# Patient Record
Sex: Male | Born: 1960 | Race: White | Hispanic: No | Marital: Married | Smoking: Never smoker
Health system: Southern US, Community
[De-identification: ages and names within clinical notes are randomized; demographics above are authoritative.]

## PROBLEM LIST (undated history)

## (undated) DIAGNOSIS — C719 Malignant neoplasm of brain, unspecified: Secondary | ICD-10-CM

## (undated) DIAGNOSIS — R519 Headache, unspecified: Secondary | ICD-10-CM

## (undated) DIAGNOSIS — R569 Unspecified convulsions: Secondary | ICD-10-CM

## (undated) DIAGNOSIS — I10 Essential (primary) hypertension: Secondary | ICD-10-CM

## (undated) DIAGNOSIS — E079 Disorder of thyroid, unspecified: Secondary | ICD-10-CM

## (undated) DIAGNOSIS — J309 Allergic rhinitis, unspecified: Secondary | ICD-10-CM

## (undated) DIAGNOSIS — M1990 Unspecified osteoarthritis, unspecified site: Secondary | ICD-10-CM

## (undated) DIAGNOSIS — E039 Hypothyroidism, unspecified: Secondary | ICD-10-CM

## (undated) HISTORY — DX: Unspecified convulsions: R56.9

## (undated) HISTORY — DX: Disorder of thyroid, unspecified: E07.9

## (undated) HISTORY — DX: Malignant neoplasm of brain, unspecified: C71.9

## (undated) HISTORY — DX: Essential (primary) hypertension: I10

## (undated) HISTORY — DX: Allergic rhinitis, unspecified: J30.9

---

## 2003-07-15 ENCOUNTER — Emergency Department (HOSPITAL_COMMUNITY): Admission: EM | Admit: 2003-07-15 | Discharge: 2003-07-16 | Payer: Self-pay | Admitting: Emergency Medicine

## 2003-07-19 ENCOUNTER — Observation Stay (HOSPITAL_COMMUNITY): Admission: AD | Admit: 2003-07-19 | Discharge: 2003-07-20 | Payer: Self-pay | Admitting: Nephrology

## 2004-06-25 ENCOUNTER — Ambulatory Visit: Payer: Self-pay | Admitting: Internal Medicine

## 2004-11-08 ENCOUNTER — Ambulatory Visit: Payer: Self-pay | Admitting: Internal Medicine

## 2005-01-07 ENCOUNTER — Ambulatory Visit: Payer: Self-pay | Admitting: Internal Medicine

## 2005-07-25 ENCOUNTER — Ambulatory Visit: Payer: Self-pay | Admitting: Internal Medicine

## 2007-02-19 HISTORY — PX: CRANIOTOMY: SHX93

## 2007-11-20 ENCOUNTER — Emergency Department (HOSPITAL_COMMUNITY): Admission: EM | Admit: 2007-11-20 | Discharge: 2007-11-20 | Payer: Self-pay | Admitting: Emergency Medicine

## 2007-12-01 ENCOUNTER — Encounter: Payer: Self-pay | Admitting: Internal Medicine

## 2007-12-02 ENCOUNTER — Encounter: Payer: Self-pay | Admitting: Internal Medicine

## 2007-12-06 ENCOUNTER — Ambulatory Visit (HOSPITAL_COMMUNITY): Admission: RE | Admit: 2007-12-06 | Discharge: 2007-12-06 | Payer: Self-pay | Admitting: Neurosurgery

## 2007-12-07 ENCOUNTER — Encounter (INDEPENDENT_AMBULATORY_CARE_PROVIDER_SITE_OTHER): Payer: Self-pay | Admitting: Neurosurgery

## 2007-12-07 ENCOUNTER — Inpatient Hospital Stay (HOSPITAL_COMMUNITY): Admission: RE | Admit: 2007-12-07 | Discharge: 2007-12-09 | Payer: Self-pay | Admitting: Neurosurgery

## 2007-12-08 ENCOUNTER — Ambulatory Visit: Payer: Self-pay | Admitting: Oncology

## 2007-12-09 ENCOUNTER — Ambulatory Visit: Payer: Self-pay | Admitting: Oncology

## 2007-12-15 LAB — COMPREHENSIVE METABOLIC PANEL
ALT: 17 U/L (ref 0–53)
AST: 10 U/L (ref 0–37)
CO2: 24 mEq/L (ref 19–32)
Chloride: 102 mEq/L (ref 96–112)
Creatinine, Ser: 0.85 mg/dL (ref 0.40–1.50)
Sodium: 137 mEq/L (ref 135–145)
Total Bilirubin: 0.5 mg/dL (ref 0.3–1.2)
Total Protein: 6.5 g/dL (ref 6.0–8.3)

## 2007-12-15 LAB — CBC WITH DIFFERENTIAL/PLATELET
BASO%: 0 % (ref 0.0–2.0)
EOS%: 0 % (ref 0.0–7.0)
LYMPH%: 7.9 % — ABNORMAL LOW (ref 14.0–48.0)
MCH: 31.2 pg (ref 28.0–33.4)
MCHC: 34 g/dL (ref 32.0–35.9)
MONO#: 0.8 10*3/uL (ref 0.1–0.9)
RBC: 4.29 10*6/uL (ref 4.20–5.71)
WBC: 13.6 10*3/uL — ABNORMAL HIGH (ref 4.0–10.0)
lymph#: 1.1 10*3/uL (ref 0.9–3.3)

## 2007-12-16 ENCOUNTER — Ambulatory Visit: Admission: RE | Admit: 2007-12-16 | Discharge: 2008-02-18 | Payer: Self-pay | Admitting: Radiation Oncology

## 2007-12-17 ENCOUNTER — Encounter: Payer: Self-pay | Admitting: Internal Medicine

## 2007-12-21 ENCOUNTER — Encounter: Payer: Self-pay | Admitting: Internal Medicine

## 2007-12-24 LAB — URINALYSIS, MICROSCOPIC - CHCC
Blood: NEGATIVE
Glucose: NEGATIVE g/dL
Leukocyte Esterase: NEGATIVE
Nitrite: NEGATIVE
Protein: <30 mg/dL
Specific Gravity, Urine: 1.025 (ref 1.003–1.035)
WBC, UA: NEGATIVE (ref 0–2)

## 2007-12-24 LAB — COMPREHENSIVE METABOLIC PANEL
Alkaline Phosphatase: 41 U/L (ref 39–117)
CO2: 22 mEq/L (ref 19–32)
Creatinine, Ser: 0.92 mg/dL (ref 0.40–1.50)
Glucose, Bld: 96 mg/dL (ref 70–99)
Sodium: 136 mEq/L (ref 135–145)
Total Bilirubin: 0.5 mg/dL (ref 0.3–1.2)
Total Protein: 6.5 g/dL (ref 6.0–8.3)

## 2007-12-24 LAB — CBC WITH DIFFERENTIAL/PLATELET
BASO%: 0.2 % (ref 0.0–2.0)
Eosinophils Absolute: 0 10*3/uL (ref 0.0–0.5)
HGB: 13 g/dL (ref 13.0–17.1)
LYMPH%: 11 % — ABNORMAL LOW (ref 14.0–48.0)
MONO#: 1.1 10*3/uL — ABNORMAL HIGH (ref 0.1–0.9)
MONO%: 11.5 % (ref 0.0–13.0)
NEUT#: 7.5 10*3/uL — ABNORMAL HIGH (ref 1.5–6.5)
NEUT%: 77.2 % — ABNORMAL HIGH (ref 40.0–75.0)
RDW: 13 % (ref 11.2–14.6)
WBC: 9.7 10*3/uL (ref 4.0–10.0)
lymph#: 1.1 10*3/uL (ref 0.9–3.3)

## 2008-01-04 ENCOUNTER — Encounter: Payer: Self-pay | Admitting: Internal Medicine

## 2008-01-12 LAB — CBC WITH DIFFERENTIAL/PLATELET
Basophils Absolute: 0 10*3/uL (ref 0.0–0.1)
Eosinophils Absolute: 0 10*3/uL (ref 0.0–0.5)
HCT: 38.5 % — ABNORMAL LOW (ref 38.7–49.9)
LYMPH%: 16.9 % (ref 14.0–48.0)
MCV: 92 fL (ref 81.6–98.0)
MONO%: 7.8 % (ref 0.0–13.0)
NEUT#: 6.1 10*3/uL (ref 1.5–6.5)
NEUT%: 75 % (ref 40.0–75.0)
Platelets: 224 10*3/uL (ref 145–400)
RBC: 4.19 10*6/uL — ABNORMAL LOW (ref 4.20–5.71)

## 2008-01-18 LAB — CBC WITH DIFFERENTIAL/PLATELET
BASO%: 0.3 % (ref 0.0–2.0)
LYMPH%: 31.7 % (ref 14.0–48.0)
MCH: 32.2 pg (ref 28.0–33.4)
MCHC: 34.6 g/dL (ref 32.0–35.9)
MCV: 92.9 fL (ref 81.6–98.0)
MONO%: 10.3 % (ref 0.0–13.0)
NEUT%: 57.2 % (ref 40.0–75.0)
Platelets: 224 10*3/uL (ref 145–400)
RBC: 3.94 10*6/uL — ABNORMAL LOW (ref 4.20–5.71)

## 2008-01-20 ENCOUNTER — Encounter: Payer: Self-pay | Admitting: Internal Medicine

## 2008-01-26 LAB — CBC WITH DIFFERENTIAL/PLATELET
BASO%: 0.3 % (ref 0.0–2.0)
HCT: 36.4 % — ABNORMAL LOW (ref 38.7–49.9)
LYMPH%: 12.4 % — ABNORMAL LOW (ref 14.0–48.0)
MCH: 32.1 pg (ref 28.0–33.4)
MCHC: 35 g/dL (ref 32.0–35.9)
MCV: 91.6 fL (ref 81.6–98.0)
MONO%: 6.2 % (ref 0.0–13.0)
NEUT%: 80.9 % — ABNORMAL HIGH (ref 40.0–75.0)
Platelets: 233 10*3/uL (ref 145–400)
RBC: 3.98 10*6/uL — ABNORMAL LOW (ref 4.20–5.71)

## 2008-02-03 ENCOUNTER — Ambulatory Visit: Payer: Self-pay | Admitting: Oncology

## 2008-02-05 LAB — COMPREHENSIVE METABOLIC PANEL
ALT: 46 U/L (ref 0–53)
AST: 20 U/L (ref 0–37)
Albumin: 4.4 g/dL (ref 3.5–5.2)
BUN: 14 mg/dL (ref 6–23)
CO2: 23 mEq/L (ref 19–32)
Calcium: 9.1 mg/dL (ref 8.4–10.5)
Chloride: 105 mEq/L (ref 96–112)
Potassium: 3.7 mEq/L (ref 3.5–5.3)

## 2008-02-05 LAB — CBC WITH DIFFERENTIAL/PLATELET
BASO%: 0.3 % (ref 0.0–2.0)
EOS%: 1 % (ref 0.0–7.0)
LYMPH%: 17.6 % (ref 14.0–48.0)
MCH: 32.3 pg (ref 28.0–33.4)
MCHC: 35 g/dL (ref 32.0–35.9)
MONO#: 0.9 10*3/uL (ref 0.1–0.9)
NEUT%: 70.2 % (ref 40.0–75.0)
RBC: 4 10*6/uL — ABNORMAL LOW (ref 4.20–5.71)
WBC: 8.4 10*3/uL (ref 4.0–10.0)
lymph#: 1.5 10*3/uL (ref 0.9–3.3)

## 2008-02-17 LAB — CBC WITH DIFFERENTIAL/PLATELET
BASO%: 0.4 % (ref 0.0–2.0)
EOS%: 1.8 % (ref 0.0–7.0)
MCH: 32.5 pg (ref 28.0–33.4)
MCHC: 35.1 g/dL (ref 32.0–35.9)
RDW: 14.2 % (ref 11.2–14.6)
WBC: 6.4 10*3/uL (ref 4.0–10.0)
lymph#: 1.3 10*3/uL (ref 0.9–3.3)

## 2008-02-24 LAB — CBC WITH DIFFERENTIAL/PLATELET
Eosinophils Absolute: 0.2 10*3/uL (ref 0.0–0.5)
HCT: 35.1 % — ABNORMAL LOW (ref 38.7–49.9)
LYMPH%: 22 % (ref 14.0–48.0)
MCHC: 35.3 g/dL (ref 32.0–35.9)
MCV: 92 fL (ref 81.6–98.0)
MONO#: 0.8 10*3/uL (ref 0.1–0.9)
MONO%: 13.4 % — ABNORMAL HIGH (ref 0.0–13.0)
NEUT%: 60.1 % (ref 40.0–75.0)
Platelets: 208 10*3/uL (ref 145–400)
WBC: 6.1 10*3/uL (ref 4.0–10.0)

## 2008-02-29 ENCOUNTER — Encounter: Payer: Self-pay | Admitting: Internal Medicine

## 2008-03-10 ENCOUNTER — Encounter: Payer: Self-pay | Admitting: Internal Medicine

## 2008-03-11 LAB — COMPREHENSIVE METABOLIC PANEL
ALT: 38 U/L (ref 0–53)
AST: 26 U/L (ref 0–37)
Albumin: 4.2 g/dL (ref 3.5–5.2)
BUN: 27 mg/dL — ABNORMAL HIGH (ref 6–23)
Calcium: 8.9 mg/dL (ref 8.4–10.5)
Chloride: 103 mEq/L (ref 96–112)
Potassium: 3.8 mEq/L (ref 3.5–5.3)
Sodium: 140 mEq/L (ref 135–145)
Total Protein: 7.1 g/dL (ref 6.0–8.3)

## 2008-03-11 LAB — CBC WITH DIFFERENTIAL/PLATELET
BASO%: 0.5 % (ref 0.0–2.0)
Basophils Absolute: 0 10*3/uL (ref 0.0–0.1)
EOS%: 2.9 % (ref 0.0–7.0)
HGB: 12.6 g/dL — ABNORMAL LOW (ref 13.0–17.1)
MCH: 32.5 pg (ref 28.0–33.4)
NEUT#: 3.5 10*3/uL (ref 1.5–6.5)
RBC: 3.88 10*6/uL — ABNORMAL LOW (ref 4.20–5.71)
RDW: 13.7 % (ref 11.2–14.6)
lymph#: 1.1 10*3/uL (ref 0.9–3.3)

## 2008-03-14 ENCOUNTER — Encounter: Payer: Self-pay | Admitting: Internal Medicine

## 2008-03-22 ENCOUNTER — Ambulatory Visit: Payer: Self-pay | Admitting: Oncology

## 2008-03-22 LAB — BASIC METABOLIC PANEL
BUN: 23 mg/dL (ref 6–23)
CO2: 20 mEq/L (ref 19–32)
Chloride: 103 mEq/L (ref 96–112)
Creatinine, Ser: 1.27 mg/dL (ref 0.40–1.50)
Glucose, Bld: 122 mg/dL — ABNORMAL HIGH (ref 70–99)
Potassium: 3.3 mEq/L — ABNORMAL LOW (ref 3.5–5.3)

## 2008-04-12 ENCOUNTER — Ambulatory Visit (HOSPITAL_COMMUNITY): Admission: RE | Admit: 2008-04-12 | Discharge: 2008-04-12 | Payer: Self-pay | Admitting: Oncology

## 2008-04-15 LAB — TECHNOLOGIST REVIEW

## 2008-04-15 LAB — CBC WITH DIFFERENTIAL/PLATELET
BASO%: 0.4 % (ref 0.0–2.0)
EOS%: 0.1 % (ref 0.0–7.0)
Eosinophils Absolute: 0 10*3/uL (ref 0.0–0.5)
LYMPH%: 65.2 % — ABNORMAL HIGH (ref 14.0–49.0)
MCH: 33 pg (ref 27.2–33.4)
MCHC: 35.3 g/dL (ref 32.0–36.0)
MCV: 93.4 fL (ref 79.3–98.0)
MONO%: 16.6 % — ABNORMAL HIGH (ref 0.0–14.0)
NEUT#: 0.3 10*3/uL — CL (ref 1.5–6.5)
Platelets: 158 10*3/uL (ref 140–400)
RBC: 3.17 10*6/uL — ABNORMAL LOW (ref 4.20–5.82)
RDW: 14.1 % (ref 11.0–14.6)

## 2008-04-15 LAB — COMPREHENSIVE METABOLIC PANEL
ALT: 36 U/L (ref 0–53)
Alkaline Phosphatase: 57 U/L (ref 39–117)
CO2: 19 mEq/L (ref 19–32)
Creatinine, Ser: 1.18 mg/dL (ref 0.40–1.50)
Sodium: 140 mEq/L (ref 135–145)
Total Bilirubin: 0.3 mg/dL (ref 0.3–1.2)
Total Protein: 7.2 g/dL (ref 6.0–8.3)

## 2008-04-20 LAB — CBC WITH DIFFERENTIAL/PLATELET
Basophils Absolute: 0 10*3/uL (ref 0.0–0.1)
EOS%: 0 % (ref 0.0–7.0)
Eosinophils Absolute: 0 10*3/uL (ref 0.0–0.5)
HGB: 10.1 g/dL — ABNORMAL LOW (ref 13.0–17.1)
LYMPH%: 23.3 % (ref 14.0–49.0)
MCH: 34 pg — ABNORMAL HIGH (ref 27.2–33.4)
MCV: 96.5 fL (ref 79.3–98.0)
MONO%: 15.6 % — ABNORMAL HIGH (ref 0.0–14.0)
Platelets: 297 10*3/uL (ref 140–400)
RBC: 2.96 10*6/uL — ABNORMAL LOW (ref 4.20–5.82)
RDW: 14.7 % — ABNORMAL HIGH (ref 11.0–14.6)

## 2008-04-29 LAB — CBC WITH DIFFERENTIAL/PLATELET
BASO%: 0.2 % (ref 0.0–2.0)
EOS%: 0 % (ref 0.0–7.0)
HCT: 31.9 % — ABNORMAL LOW (ref 38.4–49.9)
LYMPH%: 22.5 % (ref 14.0–49.0)
MCH: 34.2 pg — ABNORMAL HIGH (ref 27.2–33.4)
MCHC: 34.7 g/dL (ref 32.0–36.0)
MONO#: 1 10*3/uL — ABNORMAL HIGH (ref 0.1–0.9)
MONO%: 13.7 % (ref 0.0–14.0)
NEUT%: 63.6 % (ref 39.0–75.0)
Platelets: 186 10*3/uL (ref 140–400)
RBC: 3.23 10*6/uL — ABNORMAL LOW (ref 4.20–5.82)
WBC: 7 10*3/uL (ref 4.0–10.3)

## 2008-05-04 LAB — CBC WITH DIFFERENTIAL/PLATELET
BASO%: 0.3 % (ref 0.0–2.0)
EOS%: 0.1 % (ref 0.0–7.0)
MCH: 34.3 pg — ABNORMAL HIGH (ref 27.2–33.4)
MCHC: 34.1 g/dL (ref 32.0–36.0)
MONO#: 0.7 10*3/uL (ref 0.1–0.9)
NEUT%: 78.8 % — ABNORMAL HIGH (ref 39.0–75.0)
RBC: 2.96 10*6/uL — ABNORMAL LOW (ref 4.20–5.82)
RDW: 20 % — ABNORMAL HIGH (ref 11.0–14.6)
WBC: 8.2 10*3/uL (ref 4.0–10.3)
lymph#: 1 10*3/uL (ref 0.9–3.3)

## 2008-05-10 ENCOUNTER — Ambulatory Visit: Payer: Self-pay | Admitting: Oncology

## 2008-05-10 LAB — CBC WITH DIFFERENTIAL/PLATELET
BASO%: 0.4 % (ref 0.0–2.0)
Basophils Absolute: 0 10*3/uL (ref 0.0–0.1)
EOS%: 0.5 % (ref 0.0–7.0)
HCT: 30.8 % — ABNORMAL LOW (ref 38.4–49.9)
HGB: 10.8 g/dL — ABNORMAL LOW (ref 13.0–17.1)
MCH: 35.6 pg — ABNORMAL HIGH (ref 27.2–33.4)
MONO#: 0.7 10*3/uL (ref 0.1–0.9)
RDW: 21.1 % — ABNORMAL HIGH (ref 11.0–14.6)
WBC: 7.7 10*3/uL (ref 4.0–10.3)
lymph#: 0.9 10*3/uL (ref 0.9–3.3)

## 2008-05-10 LAB — COMPREHENSIVE METABOLIC PANEL
ALT: 47 U/L (ref 0–53)
AST: 26 U/L (ref 0–37)
Albumin: 3.7 g/dL (ref 3.5–5.2)
BUN: 19 mg/dL (ref 6–23)
CO2: 25 mEq/L (ref 19–32)
Calcium: 8.4 mg/dL (ref 8.4–10.5)
Chloride: 111 mEq/L (ref 96–112)
Potassium: 3.4 mEq/L — ABNORMAL LOW (ref 3.5–5.3)

## 2008-05-18 ENCOUNTER — Ambulatory Visit (HOSPITAL_COMMUNITY): Admission: RE | Admit: 2008-05-18 | Discharge: 2008-05-18 | Payer: Self-pay | Admitting: Oncology

## 2008-05-20 LAB — CBC WITH DIFFERENTIAL/PLATELET
EOS%: 2.4 % (ref 0.0–7.0)
LYMPH%: 20.3 % (ref 14.0–49.0)
MCHC: 35 g/dL (ref 32.0–36.0)
MCV: 100.6 fL — ABNORMAL HIGH (ref 79.3–98.0)
MONO%: 10.1 % (ref 0.0–14.0)
NEUT#: 3.3 10*3/uL (ref 1.5–6.5)
Platelets: 87 10*3/uL — ABNORMAL LOW (ref 140–400)
RBC: 3.35 10*6/uL — ABNORMAL LOW (ref 4.20–5.82)
RDW: 19.3 % — ABNORMAL HIGH (ref 11.0–14.6)

## 2008-05-20 LAB — BASIC METABOLIC PANEL
BUN: 12 mg/dL (ref 6–23)
CO2: 24 mEq/L (ref 19–32)
Chloride: 107 mEq/L (ref 96–112)
Creatinine, Ser: 0.99 mg/dL (ref 0.40–1.50)
Glucose, Bld: 87 mg/dL (ref 70–99)

## 2008-05-27 LAB — CBC WITH DIFFERENTIAL/PLATELET
Basophils Absolute: 0 10*3/uL (ref 0.0–0.1)
EOS%: 0.8 % (ref 0.0–7.0)
LYMPH%: 18.8 % (ref 14.0–49.0)
MCH: 35.6 pg — ABNORMAL HIGH (ref 27.2–33.4)
MCV: 101 fL — ABNORMAL HIGH (ref 79.3–98.0)
MONO%: 8.5 % (ref 0.0–14.0)
RBC: 3.39 10*6/uL — ABNORMAL LOW (ref 4.20–5.82)
RDW: 19.5 % — ABNORMAL HIGH (ref 11.0–14.6)

## 2008-06-03 LAB — CBC WITH DIFFERENTIAL/PLATELET
BASO%: 0.4 % (ref 0.0–2.0)
Eosinophils Absolute: 0 10*3/uL (ref 0.0–0.5)
MCHC: 34.9 g/dL (ref 32.0–36.0)
MCV: 99.6 fL — ABNORMAL HIGH (ref 79.3–98.0)
MONO%: 14.7 % — ABNORMAL HIGH (ref 0.0–14.0)
NEUT#: 3.1 10*3/uL (ref 1.5–6.5)
RBC: 3.19 10*6/uL — ABNORMAL LOW (ref 4.20–5.82)
RDW: 18.7 % — ABNORMAL HIGH (ref 11.0–14.6)
WBC: 4.6 10*3/uL (ref 4.0–10.3)

## 2008-06-03 LAB — BASIC METABOLIC PANEL
Glucose, Bld: 102 mg/dL — ABNORMAL HIGH (ref 70–99)
Potassium: 3.6 mEq/L (ref 3.5–5.3)
Sodium: 140 mEq/L (ref 135–145)

## 2008-06-22 ENCOUNTER — Ambulatory Visit: Payer: Self-pay | Admitting: Oncology

## 2008-06-24 LAB — CBC WITH DIFFERENTIAL/PLATELET
BASO%: 0.3 % (ref 0.0–2.0)
Basophils Absolute: 0 10*3/uL (ref 0.0–0.1)
Eosinophils Absolute: 0.1 10*3/uL (ref 0.0–0.5)
HCT: 33 % — ABNORMAL LOW (ref 38.4–49.9)
HGB: 11.5 g/dL — ABNORMAL LOW (ref 13.0–17.1)
MONO#: 0.6 10*3/uL (ref 0.1–0.9)
NEUT%: 57.2 % (ref 39.0–75.0)
lymph#: 1.1 10*3/uL (ref 0.9–3.3)

## 2008-06-24 LAB — COMPREHENSIVE METABOLIC PANEL
ALT: 35 U/L (ref 0–53)
BUN: 13 mg/dL (ref 6–23)
CO2: 21 mEq/L (ref 19–32)
Calcium: 8.6 mg/dL (ref 8.4–10.5)
Chloride: 112 mEq/L (ref 96–112)
Creatinine, Ser: 1.02 mg/dL (ref 0.40–1.50)
Glucose, Bld: 100 mg/dL — ABNORMAL HIGH (ref 70–99)

## 2008-07-11 LAB — CBC WITH DIFFERENTIAL/PLATELET
BASO%: 0.2 % (ref 0.0–2.0)
HCT: 31.4 % — ABNORMAL LOW (ref 38.4–49.9)
MCHC: 35 g/dL (ref 32.0–36.0)
MONO#: 0.6 10*3/uL (ref 0.1–0.9)
NEUT#: 3.4 10*3/uL (ref 1.5–6.5)
NEUT%: 72.1 % (ref 39.0–75.0)
RBC: 3.26 10*6/uL — ABNORMAL LOW (ref 4.20–5.82)
WBC: 4.7 10*3/uL (ref 4.0–10.3)
lymph#: 0.7 10*3/uL — ABNORMAL LOW (ref 0.9–3.3)

## 2008-07-11 LAB — BASIC METABOLIC PANEL
CO2: 24 mEq/L (ref 19–32)
Chloride: 110 mEq/L (ref 96–112)
Sodium: 140 mEq/L (ref 135–145)

## 2008-07-12 ENCOUNTER — Ambulatory Visit (HOSPITAL_COMMUNITY): Admission: RE | Admit: 2008-07-12 | Discharge: 2008-07-12 | Payer: Self-pay | Admitting: Oncology

## 2008-07-22 LAB — BASIC METABOLIC PANEL
BUN: 13 mg/dL (ref 6–23)
CO2: 24 mEq/L (ref 19–32)
Calcium: 8.6 mg/dL (ref 8.4–10.5)
Chloride: 111 mEq/L (ref 96–112)
Creatinine, Ser: 1.12 mg/dL (ref 0.40–1.50)
Glucose, Bld: 95 mg/dL (ref 70–99)
Potassium: 3.3 mEq/L — ABNORMAL LOW (ref 3.5–5.3)
Sodium: 141 mEq/L (ref 135–145)

## 2008-07-22 LAB — CBC WITH DIFFERENTIAL/PLATELET
Eosinophils Absolute: 0.1 10*3/uL (ref 0.0–0.5)
HCT: 33.1 % — ABNORMAL LOW (ref 38.4–49.9)
LYMPH%: 24 % (ref 14.0–49.0)
MONO#: 0.6 10*3/uL (ref 0.1–0.9)
NEUT#: 2.1 10*3/uL (ref 1.5–6.5)
Platelets: 183 10*3/uL (ref 140–400)
RBC: 3.3 10*6/uL — ABNORMAL LOW (ref 4.20–5.82)
WBC: 3.6 10*3/uL — ABNORMAL LOW (ref 4.0–10.3)

## 2008-08-04 LAB — BASIC METABOLIC PANEL
BUN: 14 mg/dL (ref 6–23)
CO2: 21 mEq/L (ref 19–32)
Chloride: 112 mEq/L (ref 96–112)
Creatinine, Ser: 1.08 mg/dL (ref 0.40–1.50)
Glucose, Bld: 93 mg/dL (ref 70–99)

## 2008-08-04 LAB — CBC WITH DIFFERENTIAL/PLATELET
Basophils Absolute: 0 10*3/uL (ref 0.0–0.1)
EOS%: 1.7 % (ref 0.0–7.0)
Eosinophils Absolute: 0.1 10*3/uL (ref 0.0–0.5)
HCT: 34.1 % — ABNORMAL LOW (ref 38.4–49.9)
HGB: 12.2 g/dL — ABNORMAL LOW (ref 13.0–17.1)
MCH: 35.5 pg — ABNORMAL HIGH (ref 27.2–33.4)
MONO#: 0.6 10*3/uL (ref 0.1–0.9)
NEUT#: 2 10*3/uL (ref 1.5–6.5)
NEUT%: 56.3 % (ref 39.0–75.0)
lymph#: 0.9 10*3/uL (ref 0.9–3.3)

## 2008-08-15 ENCOUNTER — Ambulatory Visit (HOSPITAL_COMMUNITY): Admission: RE | Admit: 2008-08-15 | Discharge: 2008-08-15 | Payer: Self-pay | Admitting: Oncology

## 2008-08-15 ENCOUNTER — Ambulatory Visit: Payer: Self-pay | Admitting: Oncology

## 2008-08-30 LAB — COMPREHENSIVE METABOLIC PANEL
ALT: 23 U/L (ref 0–53)
AST: 24 U/L (ref 0–37)
Alkaline Phosphatase: 58 U/L (ref 39–117)
CO2: 23 mEq/L (ref 19–32)
Sodium: 139 mEq/L (ref 135–145)
Total Bilirubin: 0.7 mg/dL (ref 0.3–1.2)
Total Protein: 6.8 g/dL (ref 6.0–8.3)

## 2008-08-30 LAB — CBC WITH DIFFERENTIAL/PLATELET
BASO%: 0.2 % (ref 0.0–2.0)
EOS%: 1 % (ref 0.0–7.0)
LYMPH%: 12.3 % — ABNORMAL LOW (ref 14.0–49.0)
MCH: 34.5 pg — ABNORMAL HIGH (ref 27.2–33.4)
MCHC: 35.9 g/dL (ref 32.0–36.0)
MONO#: 0.4 10*3/uL (ref 0.1–0.9)
Platelets: 128 10*3/uL — ABNORMAL LOW (ref 140–400)
RBC: 3.7 10*6/uL — ABNORMAL LOW (ref 4.20–5.82)
WBC: 4.7 10*3/uL (ref 4.0–10.3)

## 2008-09-13 LAB — CBC WITH DIFFERENTIAL/PLATELET
Basophils Absolute: 0 10*3/uL (ref 0.0–0.1)
Eosinophils Absolute: 0 10*3/uL (ref 0.0–0.5)
HGB: 12.7 g/dL — ABNORMAL LOW (ref 13.0–17.1)
LYMPH%: 8.8 % — ABNORMAL LOW (ref 14.0–49.0)
MCV: 93.8 fL (ref 79.3–98.0)
MONO%: 8.6 % (ref 0.0–14.0)
NEUT#: 4.6 10*3/uL (ref 1.5–6.5)
NEUT%: 82 % — ABNORMAL HIGH (ref 39.0–75.0)
Platelets: 101 10*3/uL — ABNORMAL LOW (ref 140–400)

## 2008-09-13 LAB — COMPREHENSIVE METABOLIC PANEL
Albumin: 4.2 g/dL (ref 3.5–5.2)
Alkaline Phosphatase: 57 U/L (ref 39–117)
BUN: 15 mg/dL (ref 6–23)
Glucose, Bld: 87 mg/dL (ref 70–99)
Total Bilirubin: 0.6 mg/dL (ref 0.3–1.2)

## 2008-09-19 ENCOUNTER — Ambulatory Visit (HOSPITAL_COMMUNITY): Admission: RE | Admit: 2008-09-19 | Discharge: 2008-09-19 | Payer: Self-pay | Admitting: Oncology

## 2008-09-23 ENCOUNTER — Ambulatory Visit: Payer: Self-pay | Admitting: Oncology

## 2008-09-27 LAB — COMPREHENSIVE METABOLIC PANEL
AST: 28 U/L (ref 0–37)
Alkaline Phosphatase: 60 U/L (ref 39–117)
BUN: 15 mg/dL (ref 6–23)
Creatinine, Ser: 1.27 mg/dL (ref 0.40–1.50)
Potassium: 3.1 mEq/L — ABNORMAL LOW (ref 3.5–5.3)
Total Bilirubin: 0.7 mg/dL (ref 0.3–1.2)

## 2008-09-27 LAB — CBC WITH DIFFERENTIAL/PLATELET
Basophils Absolute: 0 10*3/uL (ref 0.0–0.1)
EOS%: 1.8 % (ref 0.0–7.0)
HGB: 12.1 g/dL — ABNORMAL LOW (ref 13.0–17.1)
LYMPH%: 14.8 % (ref 14.0–49.0)
MCH: 33.8 pg — ABNORMAL HIGH (ref 27.2–33.4)
MCV: 95.9 fL (ref 79.3–98.0)
MONO%: 9.5 % (ref 0.0–14.0)
NEUT%: 73.8 % (ref 39.0–75.0)
RDW: 13.3 % (ref 11.0–14.6)

## 2008-10-04 LAB — CBC WITH DIFFERENTIAL/PLATELET
Basophils Absolute: 0 10*3/uL (ref 0.0–0.1)
Eosinophils Absolute: 0.1 10*3/uL (ref 0.0–0.5)
HCT: 37.1 % — ABNORMAL LOW (ref 38.4–49.9)
HGB: 12.9 g/dL — ABNORMAL LOW (ref 13.0–17.1)
LYMPH%: 22.5 % (ref 14.0–49.0)
MCV: 94.9 fL (ref 79.3–98.0)
MONO#: 0.9 10*3/uL (ref 0.1–0.9)
MONO%: 27.5 % — ABNORMAL HIGH (ref 0.0–14.0)
NEUT#: 1.6 10*3/uL (ref 1.5–6.5)
Platelets: 119 10*3/uL — ABNORMAL LOW (ref 140–400)

## 2008-10-04 LAB — BASIC METABOLIC PANEL
BUN: 16 mg/dL (ref 6–23)
Calcium: 8.8 mg/dL (ref 8.4–10.5)
Glucose, Bld: 115 mg/dL — ABNORMAL HIGH (ref 70–99)

## 2008-10-11 LAB — COMPREHENSIVE METABOLIC PANEL
ALT: 25 U/L (ref 0–53)
CO2: 19 mEq/L (ref 19–32)
Calcium: 8.7 mg/dL (ref 8.4–10.5)
Chloride: 108 mEq/L (ref 96–112)
Sodium: 138 mEq/L (ref 135–145)
Total Bilirubin: 0.7 mg/dL (ref 0.3–1.2)
Total Protein: 6.9 g/dL (ref 6.0–8.3)

## 2008-10-11 LAB — CBC WITH DIFFERENTIAL/PLATELET
BASO%: 0.5 % (ref 0.0–2.0)
MCHC: 35.3 g/dL (ref 32.0–36.0)
MONO#: 0.2 10*3/uL (ref 0.1–0.9)
RBC: 3.75 10*6/uL — ABNORMAL LOW (ref 4.20–5.82)
WBC: 2.4 10*3/uL — ABNORMAL LOW (ref 4.0–10.3)
lymph#: 0.7 10*3/uL — ABNORMAL LOW (ref 0.9–3.3)

## 2008-10-20 ENCOUNTER — Ambulatory Visit: Payer: Self-pay | Admitting: Oncology

## 2008-11-01 LAB — COMPREHENSIVE METABOLIC PANEL
AST: 28 U/L (ref 0–37)
Albumin: 4.6 g/dL (ref 3.5–5.2)
Alkaline Phosphatase: 57 U/L (ref 39–117)
BUN: 19 mg/dL (ref 6–23)
Glucose, Bld: 82 mg/dL (ref 70–99)
Potassium: 3.8 mEq/L (ref 3.5–5.3)
Sodium: 137 mEq/L (ref 135–145)
Total Bilirubin: 0.6 mg/dL (ref 0.3–1.2)

## 2008-11-01 LAB — CBC WITH DIFFERENTIAL/PLATELET
Basophils Absolute: 0 10*3/uL (ref 0.0–0.1)
EOS%: 2.7 % (ref 0.0–7.0)
Eosinophils Absolute: 0.1 10*3/uL (ref 0.0–0.5)
HGB: 13.3 g/dL (ref 13.0–17.1)
LYMPH%: 15.3 % (ref 14.0–49.0)
MCH: 34.1 pg — ABNORMAL HIGH (ref 27.2–33.4)
MCV: 97 fL (ref 79.3–98.0)
MONO%: 26.9 % — ABNORMAL HIGH (ref 0.0–14.0)
NEUT#: 1.9 10*3/uL (ref 1.5–6.5)
NEUT%: 54.6 % (ref 39.0–75.0)
Platelets: 169 10*3/uL (ref 140–400)

## 2008-11-08 LAB — CBC WITH DIFFERENTIAL/PLATELET
BASO%: 0.5 % (ref 0.0–2.0)
LYMPH%: 10 % — ABNORMAL LOW (ref 14.0–49.0)
MCHC: 36 g/dL (ref 32.0–36.0)
MCV: 92.9 fL (ref 79.3–98.0)
MONO#: 0.7 10*3/uL (ref 0.1–0.9)
MONO%: 11.4 % (ref 0.0–14.0)
Platelets: 108 10*3/uL — ABNORMAL LOW (ref 140–400)
RBC: 4.1 10*6/uL — ABNORMAL LOW (ref 4.20–5.82)
RDW: 13.1 % (ref 11.0–14.6)
WBC: 6.4 10*3/uL (ref 4.0–10.3)
nRBC: 0 % (ref 0–0)

## 2008-11-14 LAB — URINALYSIS, MICROSCOPIC - CHCC
Bilirubin (Urine): NEGATIVE
Blood: NEGATIVE
Ketones: NEGATIVE mg/dL
Specific Gravity, Urine: 1.02 (ref 1.003–1.035)
pH: 5 (ref 4.6–8.0)

## 2008-11-14 LAB — COMPREHENSIVE METABOLIC PANEL
ALT: 33 U/L (ref 0–53)
AST: 28 U/L (ref 0–37)
Creatinine, Ser: 1.29 mg/dL (ref 0.40–1.50)
Sodium: 134 mEq/L — ABNORMAL LOW (ref 135–145)
Total Bilirubin: 0.7 mg/dL (ref 0.3–1.2)

## 2008-11-15 LAB — CBC WITH DIFFERENTIAL/PLATELET
BASO%: 0.3 % (ref 0.0–2.0)
HCT: 37.3 % — ABNORMAL LOW (ref 38.4–49.9)
LYMPH%: 23.1 % (ref 14.0–49.0)
MCH: 33 pg (ref 27.2–33.4)
MCHC: 34.3 g/dL (ref 32.0–36.0)
MONO#: 1 10*3/uL — ABNORMAL HIGH (ref 0.1–0.9)
NEUT%: 44.4 % (ref 39.0–75.0)
Platelets: 130 10*3/uL — ABNORMAL LOW (ref 140–400)
WBC: 3.4 10*3/uL — ABNORMAL LOW (ref 4.0–10.3)

## 2008-11-22 ENCOUNTER — Ambulatory Visit: Payer: Self-pay | Admitting: Oncology

## 2008-11-22 LAB — CBC WITH DIFFERENTIAL/PLATELET
BASO%: 0.3 % (ref 0.0–2.0)
EOS%: 1.1 % (ref 0.0–7.0)
HCT: 36.6 % — ABNORMAL LOW (ref 38.4–49.9)
LYMPH%: 20.9 % (ref 14.0–49.0)
MCH: 34.3 pg — ABNORMAL HIGH (ref 27.2–33.4)
MCHC: 35.2 g/dL (ref 32.0–36.0)
NEUT%: 68.4 % (ref 39.0–75.0)
Platelets: 145 10*3/uL (ref 140–400)
lymph#: 0.5 10*3/uL — ABNORMAL LOW (ref 0.9–3.3)

## 2008-11-29 ENCOUNTER — Encounter (INDEPENDENT_AMBULATORY_CARE_PROVIDER_SITE_OTHER): Payer: Self-pay | Admitting: *Deleted

## 2008-11-29 LAB — COMPREHENSIVE METABOLIC PANEL
AST: 38 U/L — ABNORMAL HIGH (ref 0–37)
Albumin: 4.3 g/dL (ref 3.5–5.2)
Alkaline Phosphatase: 49 U/L (ref 39–117)
Potassium: 4.1 mEq/L (ref 3.5–5.3)
Sodium: 136 mEq/L (ref 135–145)
Total Bilirubin: 0.8 mg/dL (ref 0.3–1.2)
Total Protein: 7.2 g/dL (ref 6.0–8.3)

## 2008-11-29 LAB — CBC WITH DIFFERENTIAL/PLATELET
BASO%: 0.1 % (ref 0.0–2.0)
EOS%: 2.4 % (ref 0.0–7.0)
Eosinophils Absolute: 0.1 10*3/uL (ref 0.0–0.5)
LYMPH%: 14.8 % (ref 14.0–49.0)
MCH: 34.5 pg — ABNORMAL HIGH (ref 27.2–33.4)
MCHC: 35.2 g/dL (ref 32.0–36.0)
MCV: 98 fL (ref 79.3–98.0)
MONO%: 27.9 % — ABNORMAL HIGH (ref 0.0–14.0)
NEUT#: 1.8 10*3/uL (ref 1.5–6.5)
RBC: 3.95 10*6/uL — ABNORMAL LOW (ref 4.20–5.82)
RDW: 14.9 % — ABNORMAL HIGH (ref 11.0–14.6)

## 2008-12-06 ENCOUNTER — Encounter: Payer: Self-pay | Admitting: Internal Medicine

## 2008-12-06 LAB — CBC WITH DIFFERENTIAL/PLATELET
Basophils Absolute: 0 10*3/uL (ref 0.0–0.1)
EOS%: 1.4 % (ref 0.0–7.0)
Eosinophils Absolute: 0.1 10*3/uL (ref 0.0–0.5)
LYMPH%: 6.5 % — ABNORMAL LOW (ref 14.0–49.0)
MCH: 33.7 pg — ABNORMAL HIGH (ref 27.2–33.4)
MCV: 96.7 fL (ref 79.3–98.0)
MONO%: 12.4 % (ref 0.0–14.0)
Platelets: 111 10*3/uL — ABNORMAL LOW (ref 140–400)
RBC: 3.92 10*6/uL — ABNORMAL LOW (ref 4.20–5.82)
RDW: 13.1 % (ref 11.0–14.6)

## 2008-12-07 ENCOUNTER — Telehealth: Payer: Self-pay | Admitting: Internal Medicine

## 2008-12-08 ENCOUNTER — Telehealth: Payer: Self-pay | Admitting: Internal Medicine

## 2008-12-20 LAB — CBC WITH DIFFERENTIAL/PLATELET
BASO%: 0.1 % (ref 0.0–2.0)
Eosinophils Absolute: 0 10*3/uL (ref 0.0–0.5)
MCV: 100.1 fL — ABNORMAL HIGH (ref 79.3–98.0)
MONO%: 12.1 % (ref 0.0–14.0)
NEUT#: 2 10*3/uL (ref 1.5–6.5)
RBC: 3.78 10*6/uL — ABNORMAL LOW (ref 4.20–5.82)
RDW: 14.4 % (ref 11.0–14.6)
WBC: 2.8 10*3/uL — ABNORMAL LOW (ref 4.0–10.3)

## 2008-12-20 LAB — COMPREHENSIVE METABOLIC PANEL
ALT: 36 U/L (ref 0–53)
AST: 22 U/L (ref 0–37)
Albumin: 4.5 g/dL (ref 3.5–5.2)
Alkaline Phosphatase: 55 U/L (ref 39–117)
Glucose, Bld: 120 mg/dL — ABNORMAL HIGH (ref 70–99)
Potassium: 3.9 mEq/L (ref 3.5–5.3)
Sodium: 137 mEq/L (ref 135–145)
Total Protein: 7.2 g/dL (ref 6.0–8.3)

## 2008-12-23 ENCOUNTER — Ambulatory Visit: Payer: Self-pay | Admitting: Oncology

## 2008-12-26 ENCOUNTER — Encounter: Payer: Self-pay | Admitting: Internal Medicine

## 2008-12-27 LAB — COMPREHENSIVE METABOLIC PANEL
AST: 27 U/L (ref 0–37)
Alkaline Phosphatase: 48 U/L (ref 39–117)
BUN: 18 mg/dL (ref 6–23)
Creatinine, Ser: 1.33 mg/dL (ref 0.40–1.50)
Potassium: 4.1 mEq/L (ref 3.5–5.3)
Total Bilirubin: 0.5 mg/dL (ref 0.3–1.2)

## 2008-12-27 LAB — CBC WITH DIFFERENTIAL/PLATELET
Basophils Absolute: 0 10*3/uL (ref 0.0–0.1)
EOS%: 2.4 % (ref 0.0–7.0)
Eosinophils Absolute: 0.1 10*3/uL (ref 0.0–0.5)
HCT: 38 % — ABNORMAL LOW (ref 38.4–49.9)
HGB: 13.1 g/dL (ref 13.0–17.1)
MCH: 33.9 pg — ABNORMAL HIGH (ref 27.2–33.4)
MCV: 98.4 fL — ABNORMAL HIGH (ref 79.3–98.0)
MONO%: 23.6 % — ABNORMAL HIGH (ref 0.0–14.0)
NEUT#: 1.8 10*3/uL (ref 1.5–6.5)
NEUT%: 53.5 % (ref 39.0–75.0)
Platelets: 116 10*3/uL — ABNORMAL LOW (ref 140–400)

## 2009-01-03 LAB — COMPREHENSIVE METABOLIC PANEL
ALT: 31 U/L (ref 0–53)
AST: 20 U/L (ref 0–37)
Alkaline Phosphatase: 56 U/L (ref 39–117)
BUN: 24 mg/dL — ABNORMAL HIGH (ref 6–23)
Chloride: 102 mEq/L (ref 96–112)
Creatinine, Ser: 1.83 mg/dL — ABNORMAL HIGH (ref 0.40–1.50)
Total Bilirubin: 0.7 mg/dL (ref 0.3–1.2)

## 2009-01-03 LAB — CBC WITH DIFFERENTIAL/PLATELET
BASO%: 0.4 % (ref 0.0–2.0)
EOS%: 1.3 % (ref 0.0–7.0)
HCT: 38.1 % — ABNORMAL LOW (ref 38.4–49.9)
LYMPH%: 16.5 % (ref 14.0–49.0)
MCH: 34.2 pg — ABNORMAL HIGH (ref 27.2–33.4)
MCHC: 34.2 g/dL (ref 32.0–36.0)
MCV: 100 fL — ABNORMAL HIGH (ref 79.3–98.0)
MONO%: 8.9 % (ref 0.0–14.0)
NEUT%: 72.9 % (ref 39.0–75.0)
lymph#: 0.4 10*3/uL — ABNORMAL LOW (ref 0.9–3.3)

## 2009-01-06 LAB — URINALYSIS, MICROSCOPIC - CHCC
Blood: NEGATIVE
Ketones: NEGATIVE mg/dL
Nitrite: NEGATIVE
Protein: 30 mg/dL
Specific Gravity, Urine: 1.02 (ref 1.003–1.035)
pH: 5 (ref 4.6–8.0)

## 2009-01-06 LAB — COMPREHENSIVE METABOLIC PANEL
CO2: 23 mEq/L (ref 19–32)
Calcium: 9.1 mg/dL (ref 8.4–10.5)
Chloride: 104 mEq/L (ref 96–112)
Creatinine, Ser: 1.76 mg/dL — ABNORMAL HIGH (ref 0.40–1.50)
Glucose, Bld: 113 mg/dL — ABNORMAL HIGH (ref 70–99)
Total Bilirubin: 0.7 mg/dL (ref 0.3–1.2)

## 2009-01-09 ENCOUNTER — Ambulatory Visit: Payer: Self-pay | Admitting: Internal Medicine

## 2009-01-09 DIAGNOSIS — J309 Allergic rhinitis, unspecified: Secondary | ICD-10-CM | POA: Insufficient documentation

## 2009-01-09 DIAGNOSIS — I10 Essential (primary) hypertension: Secondary | ICD-10-CM | POA: Insufficient documentation

## 2009-01-09 DIAGNOSIS — R569 Unspecified convulsions: Secondary | ICD-10-CM

## 2009-01-09 HISTORY — DX: Unspecified convulsions: R56.9

## 2009-01-09 HISTORY — DX: Essential (primary) hypertension: I10

## 2009-01-09 HISTORY — DX: Allergic rhinitis, unspecified: J30.9

## 2009-01-10 ENCOUNTER — Encounter: Payer: Self-pay | Admitting: Internal Medicine

## 2009-01-10 LAB — CBC WITH DIFFERENTIAL/PLATELET
BASO%: 0.7 % (ref 0.0–2.0)
Eosinophils Absolute: 0.1 10*3/uL (ref 0.0–0.5)
HCT: 37.1 % — ABNORMAL LOW (ref 38.4–49.9)
LYMPH%: 30.2 % (ref 14.0–49.0)
MCHC: 34.2 g/dL (ref 32.0–36.0)
MONO#: 0.4 10*3/uL (ref 0.1–0.9)
NEUT#: 1.6 10*3/uL (ref 1.5–6.5)
NEUT%: 53.4 % (ref 39.0–75.0)
Platelets: 117 10*3/uL — ABNORMAL LOW (ref 140–400)
RBC: 3.77 10*6/uL — ABNORMAL LOW (ref 4.20–5.82)
WBC: 3.1 10*3/uL — ABNORMAL LOW (ref 4.0–10.3)
lymph#: 0.9 10*3/uL (ref 0.9–3.3)
nRBC: 0 % (ref 0–0)

## 2009-01-10 LAB — COMPREHENSIVE METABOLIC PANEL
ALT: 46 U/L (ref 0–53)
Albumin: 4.3 g/dL (ref 3.5–5.2)
CO2: 20 mEq/L (ref 19–32)
Calcium: 8.7 mg/dL (ref 8.4–10.5)
Chloride: 107 mEq/L (ref 96–112)
Glucose, Bld: 100 mg/dL — ABNORMAL HIGH (ref 70–99)
Sodium: 139 mEq/L (ref 135–145)
Total Protein: 6.7 g/dL (ref 6.0–8.3)

## 2009-01-10 LAB — UA PROTEIN, DIPSTICK - CHCC: Protein, Urine: <30 mg/dL

## 2009-01-17 ENCOUNTER — Ambulatory Visit: Payer: Self-pay | Admitting: Oncology

## 2009-01-17 LAB — COMPREHENSIVE METABOLIC PANEL
ALT: 31 U/L (ref 0–53)
Albumin: 4.5 g/dL (ref 3.5–5.2)
CO2: 19 mEq/L (ref 19–32)
Chloride: 110 mEq/L (ref 96–112)
Potassium: 3.5 mEq/L (ref 3.5–5.3)
Sodium: 141 mEq/L (ref 135–145)
Total Bilirubin: 0.5 mg/dL (ref 0.3–1.2)
Total Protein: 6.6 g/dL (ref 6.0–8.3)

## 2009-01-17 LAB — CBC WITH DIFFERENTIAL/PLATELET
BASO%: 0.2 % (ref 0.0–2.0)
LYMPH%: 7.2 % — ABNORMAL LOW (ref 14.0–49.0)
MCHC: 34.3 g/dL (ref 32.0–36.0)
MONO#: 0.4 10*3/uL (ref 0.1–0.9)
NEUT#: 4.2 10*3/uL (ref 1.5–6.5)
RBC: 3.72 10*6/uL — ABNORMAL LOW (ref 4.20–5.82)
RDW: 14.7 % — ABNORMAL HIGH (ref 11.0–14.6)
WBC: 5 10*3/uL (ref 4.0–10.3)
lymph#: 0.4 10*3/uL — ABNORMAL LOW (ref 0.9–3.3)

## 2009-01-17 LAB — URINALYSIS, MICROSCOPIC - CHCC
Blood: NEGATIVE
Glucose: NEGATIVE g/dL
Leukocyte Esterase: NEGATIVE
Nitrite: NEGATIVE

## 2009-01-24 LAB — CBC WITH DIFFERENTIAL/PLATELET
EOS%: 3.2 % (ref 0.0–7.0)
Eosinophils Absolute: 0.1 10*3/uL (ref 0.0–0.5)
LYMPH%: 9.9 % — ABNORMAL LOW (ref 14.0–49.0)
MCH: 34.1 pg — ABNORMAL HIGH (ref 27.2–33.4)
MCV: 99.2 fL — ABNORMAL HIGH (ref 79.3–98.0)
MONO%: 32.4 % — ABNORMAL HIGH (ref 0.0–14.0)
NEUT#: 1.7 10*3/uL (ref 1.5–6.5)
Platelets: 113 10*3/uL — ABNORMAL LOW (ref 140–400)
RBC: 3.69 10*6/uL — ABNORMAL LOW (ref 4.20–5.82)
nRBC: 0 % (ref 0–0)

## 2009-01-24 LAB — COMPREHENSIVE METABOLIC PANEL
AST: 30 U/L (ref 0–37)
BUN: 12 mg/dL (ref 6–23)
CO2: 22 mEq/L (ref 19–32)
Calcium: 8.4 mg/dL (ref 8.4–10.5)
Chloride: 109 mEq/L (ref 96–112)
Creatinine, Ser: 1.13 mg/dL (ref 0.40–1.50)
Total Bilirubin: 0.4 mg/dL (ref 0.3–1.2)

## 2009-01-31 ENCOUNTER — Encounter (INDEPENDENT_AMBULATORY_CARE_PROVIDER_SITE_OTHER): Payer: Self-pay | Admitting: *Deleted

## 2009-01-31 LAB — CBC WITH DIFFERENTIAL/PLATELET
Basophils Absolute: 0 10*3/uL (ref 0.0–0.1)
Eosinophils Absolute: 0.1 10*3/uL (ref 0.0–0.5)
HCT: 37 % — ABNORMAL LOW (ref 38.4–49.9)
HGB: 12.7 g/dL — ABNORMAL LOW (ref 13.0–17.1)
MONO#: 0.4 10*3/uL (ref 0.1–0.9)
NEUT%: 85.5 % — ABNORMAL HIGH (ref 39.0–75.0)
Platelets: 105 10*3/uL — ABNORMAL LOW (ref 140–400)
WBC: 6.2 10*3/uL (ref 4.0–10.3)
lymph#: 0.4 10*3/uL — ABNORMAL LOW (ref 0.9–3.3)

## 2009-01-31 LAB — COMPREHENSIVE METABOLIC PANEL
Albumin: 4.2 g/dL (ref 3.5–5.2)
BUN: 20 mg/dL (ref 6–23)
CO2: 19 mEq/L (ref 19–32)
Calcium: 8.8 mg/dL (ref 8.4–10.5)
Chloride: 110 mEq/L (ref 96–112)
Creatinine, Ser: 1.56 mg/dL — ABNORMAL HIGH (ref 0.40–1.50)
Potassium: 3.7 mEq/L (ref 3.5–5.3)

## 2009-01-31 LAB — UA PROTEIN, DIPSTICK - CHCC: Protein, Urine: <30 mg/dL

## 2009-02-14 LAB — CBC WITH DIFFERENTIAL/PLATELET
BASO%: 0 % (ref 0.0–2.0)
Eosinophils Absolute: 0.1 10*3/uL (ref 0.0–0.5)
LYMPH%: 14.6 % (ref 14.0–49.0)
MCHC: 33.6 g/dL (ref 32.0–36.0)
MCV: 100 fL — ABNORMAL HIGH (ref 79.3–98.0)
MONO#: 0.2 10*3/uL (ref 0.1–0.9)
MONO%: 8.9 % (ref 0.0–14.0)
NEUT#: 1.8 10*3/uL (ref 1.5–6.5)
RBC: 3.39 10*6/uL — ABNORMAL LOW (ref 4.20–5.82)
RDW: 13.3 % (ref 11.0–14.6)
WBC: 2.5 10*3/uL — ABNORMAL LOW (ref 4.0–10.3)

## 2009-02-14 LAB — COMPREHENSIVE METABOLIC PANEL
ALT: 34 U/L (ref 0–53)
Albumin: 3.8 g/dL (ref 3.5–5.2)
Alkaline Phosphatase: 59 U/L (ref 39–117)
Glucose, Bld: 103 mg/dL — ABNORMAL HIGH (ref 70–99)
Potassium: 3.9 mEq/L (ref 3.5–5.3)
Sodium: 141 mEq/L (ref 135–145)
Total Bilirubin: 0.5 mg/dL (ref 0.3–1.2)
Total Protein: 6.2 g/dL (ref 6.0–8.3)

## 2009-02-16 ENCOUNTER — Ambulatory Visit: Payer: Self-pay | Admitting: Oncology

## 2009-02-16 LAB — CBC WITH DIFFERENTIAL/PLATELET
Basophils Absolute: 0 10*3/uL (ref 0.0–0.1)
EOS%: 3.8 % (ref 0.0–7.0)
MCH: 33.8 pg — ABNORMAL HIGH (ref 27.2–33.4)
MCV: 100 fL — ABNORMAL HIGH (ref 79.3–98.0)
MONO%: 11.3 % (ref 0.0–14.0)
RBC: 3.49 10*6/uL — ABNORMAL LOW (ref 4.20–5.82)
RDW: 13.6 % (ref 11.0–14.6)

## 2009-02-21 ENCOUNTER — Encounter: Payer: Self-pay | Admitting: Internal Medicine

## 2009-02-21 LAB — CBC WITH DIFFERENTIAL/PLATELET
BASO%: 0.2 % (ref 0.0–2.0)
Basophils Absolute: 0 10*3/uL (ref 0.0–0.1)
EOS%: 2.3 % (ref 0.0–7.0)
Eosinophils Absolute: 0.1 10*3/uL (ref 0.0–0.5)
HCT: 37.1 % — ABNORMAL LOW (ref 38.4–49.9)
LYMPH%: 9.6 % — ABNORMAL LOW (ref 14.0–49.0)
MCH: 33.6 pg — ABNORMAL HIGH (ref 27.2–33.4)
MCV: 102.2 fL — ABNORMAL HIGH (ref 79.3–98.0)
MONO#: 0.8 10*3/uL (ref 0.1–0.9)
MONO%: 15.7 % — ABNORMAL HIGH (ref 0.0–14.0)
NEUT%: 72.2 % (ref 39.0–75.0)
Platelets: 91 10*3/uL — ABNORMAL LOW (ref 140–400)
RBC: 3.63 10*6/uL — ABNORMAL LOW (ref 4.20–5.82)
RDW: 13.7 % (ref 11.0–14.6)
WBC: 5.2 10*3/uL (ref 4.0–10.3)
lymph#: 0.5 10*3/uL — ABNORMAL LOW (ref 0.9–3.3)
nRBC: 0 % (ref 0–0)

## 2009-02-21 LAB — COMPREHENSIVE METABOLIC PANEL
Calcium: 8.7 mg/dL (ref 8.4–10.5)
Creatinine, Ser: 1.02 mg/dL (ref 0.40–1.50)
Glucose, Bld: 83 mg/dL (ref 70–99)
Total Bilirubin: 0.6 mg/dL (ref 0.3–1.2)
Total Protein: 7.4 g/dL (ref 6.0–8.3)

## 2009-02-21 LAB — UA PROTEIN, DIPSTICK - CHCC: Protein, Urine: NEGATIVE mg/dL

## 2009-02-28 LAB — CBC WITH DIFFERENTIAL/PLATELET
Basophils Absolute: 0 10*3/uL (ref 0.0–0.1)
Eosinophils Absolute: 0.1 10*3/uL (ref 0.0–0.5)
MCHC: 34.2 g/dL (ref 32.0–36.0)
NEUT%: 65.5 % (ref 39.0–75.0)
Platelets: 101 10*3/uL — ABNORMAL LOW (ref 140–400)
RBC: 3.4 10*6/uL — ABNORMAL LOW (ref 4.20–5.82)
lymph#: 0.4 10*3/uL — ABNORMAL LOW (ref 0.9–3.3)

## 2009-02-28 LAB — COMPREHENSIVE METABOLIC PANEL
AST: 15 U/L (ref 0–37)
Albumin: 4 g/dL (ref 3.5–5.2)
Chloride: 109 mEq/L (ref 96–112)
Creatinine, Ser: 1.39 mg/dL (ref 0.40–1.50)
Sodium: 141 mEq/L (ref 135–145)

## 2009-03-07 LAB — CBC WITH DIFFERENTIAL/PLATELET
EOS%: 5 % (ref 0.0–7.0)
HCT: 37.6 % — ABNORMAL LOW (ref 38.4–49.9)
MCH: 33.8 pg — ABNORMAL HIGH (ref 27.2–33.4)
MCHC: 33.2 g/dL (ref 32.0–36.0)
MCV: 101.6 fL — ABNORMAL HIGH (ref 79.3–98.0)
MONO#: 1 10*3/uL — ABNORMAL HIGH (ref 0.1–0.9)
MONO%: 25.1 % — ABNORMAL HIGH (ref 0.0–14.0)
NEUT%: 54.3 % (ref 39.0–75.0)
Platelets: 91 10*3/uL — ABNORMAL LOW (ref 140–400)
RBC: 3.7 10*6/uL — ABNORMAL LOW (ref 4.20–5.82)
RDW: 13.8 % (ref 11.0–14.6)
WBC: 3.8 10*3/uL — ABNORMAL LOW (ref 4.0–10.3)
nRBC: 0 % (ref 0–0)

## 2009-03-07 LAB — UA PROTEIN, DIPSTICK - CHCC: Protein, Urine: NEGATIVE mg/dL

## 2009-03-07 LAB — COMPREHENSIVE METABOLIC PANEL
ALT: 50 U/L (ref 0–53)
Albumin: 3.8 g/dL (ref 3.5–5.2)
Alkaline Phosphatase: 52 U/L (ref 39–117)
CO2: 25 mEq/L (ref 19–32)
Calcium: 8.6 mg/dL (ref 8.4–10.5)
Sodium: 139 mEq/L (ref 135–145)
Total Bilirubin: 0.7 mg/dL (ref 0.3–1.2)
Total Protein: 6.6 g/dL (ref 6.0–8.3)

## 2009-03-09 ENCOUNTER — Ambulatory Visit: Payer: Self-pay | Admitting: Internal Medicine

## 2009-03-14 LAB — CBC WITH DIFFERENTIAL/PLATELET
HCT: 35.2 % — ABNORMAL LOW (ref 38.4–49.9)
LYMPH%: 9.6 % — ABNORMAL LOW (ref 14.0–49.0)
MCH: 35.8 pg — ABNORMAL HIGH (ref 27.2–33.4)
MCHC: 34.8 g/dL (ref 32.0–36.0)
MCV: 102.8 fL — ABNORMAL HIGH (ref 79.3–98.0)
NEUT%: 76 % — ABNORMAL HIGH (ref 39.0–75.0)
RBC: 3.43 10*6/uL — ABNORMAL LOW (ref 4.20–5.82)
WBC: 4.7 10*3/uL (ref 4.0–10.3)

## 2009-03-20 ENCOUNTER — Ambulatory Visit: Payer: Self-pay | Admitting: Oncology

## 2009-03-20 ENCOUNTER — Encounter: Payer: Self-pay | Admitting: Internal Medicine

## 2009-03-20 LAB — CBC WITH DIFFERENTIAL/PLATELET
BASO%: 0.6 % (ref 0.0–2.0)
Eosinophils Absolute: 0.2 10*3/uL (ref 0.0–0.5)
HCT: 38.8 % (ref 38.4–49.9)
LYMPH%: 22 % (ref 14.0–49.0)
MCH: 34.5 pg — ABNORMAL HIGH (ref 27.2–33.4)
MCHC: 34 g/dL (ref 32.0–36.0)
MONO%: 26.2 % — ABNORMAL HIGH (ref 0.0–14.0)
NEUT#: 1.5 10*3/uL (ref 1.5–6.5)
NEUT%: 45.5 % (ref 39.0–75.0)
RBC: 3.83 10*6/uL — ABNORMAL LOW (ref 4.20–5.82)
WBC: 3.3 10*3/uL — ABNORMAL LOW (ref 4.0–10.3)
lymph#: 0.7 10*3/uL — ABNORMAL LOW (ref 0.9–3.3)
nRBC: 0 % (ref 0–0)

## 2009-03-20 LAB — COMPREHENSIVE METABOLIC PANEL
ALT: 46 U/L (ref 0–53)
Calcium: 8.8 mg/dL (ref 8.4–10.5)
Creatinine, Ser: 1.21 mg/dL (ref 0.40–1.50)
Glucose, Bld: 86 mg/dL (ref 70–99)
Potassium: 3.8 mEq/L (ref 3.5–5.3)
Sodium: 139 mEq/L (ref 135–145)
Total Protein: 6.9 g/dL (ref 6.0–8.3)

## 2009-03-28 LAB — COMPREHENSIVE METABOLIC PANEL
Albumin: 4.2 g/dL (ref 3.5–5.2)
Alkaline Phosphatase: 54 U/L (ref 39–117)
BUN: 17 mg/dL (ref 6–23)
CO2: 20 mEq/L (ref 19–32)
Calcium: 8.5 mg/dL (ref 8.4–10.5)
Chloride: 110 mEq/L (ref 96–112)
Glucose, Bld: 114 mg/dL — ABNORMAL HIGH (ref 70–99)
Potassium: 3.5 mEq/L (ref 3.5–5.3)
Sodium: 141 mEq/L (ref 135–145)
Total Protein: 6.5 g/dL (ref 6.0–8.3)

## 2009-03-28 LAB — CBC WITH DIFFERENTIAL/PLATELET
Basophils Absolute: 0 10*3/uL (ref 0.0–0.1)
Eosinophils Absolute: 0.1 10*3/uL (ref 0.0–0.5)
HGB: 12.2 g/dL — ABNORMAL LOW (ref 13.0–17.1)
MONO#: 0.5 10*3/uL (ref 0.1–0.9)
MONO%: 9.3 % (ref 0.0–14.0)
NEUT#: 4.8 10*3/uL (ref 1.5–6.5)
RBC: 3.39 10*6/uL — ABNORMAL LOW (ref 4.20–5.82)
RDW: 14.3 % (ref 11.0–14.6)
WBC: 5.9 10*3/uL (ref 4.0–10.3)
lymph#: 0.4 10*3/uL — ABNORMAL LOW (ref 0.9–3.3)

## 2009-04-11 ENCOUNTER — Encounter: Payer: Self-pay | Admitting: Internal Medicine

## 2009-04-11 LAB — COMPREHENSIVE METABOLIC PANEL
Albumin: 4.4 g/dL (ref 3.5–5.2)
Alkaline Phosphatase: 52 U/L (ref 39–117)
BUN: 18 mg/dL (ref 6–23)
CO2: 20 mEq/L (ref 19–32)
Calcium: 8.5 mg/dL (ref 8.4–10.5)
Glucose, Bld: 129 mg/dL — ABNORMAL HIGH (ref 70–99)
Sodium: 140 mEq/L (ref 135–145)
Total Protein: 6.9 g/dL (ref 6.0–8.3)

## 2009-04-11 LAB — LACTATE DEHYDROGENASE: LDH: 134 U/L (ref 94–250)

## 2009-04-11 LAB — CBC WITH DIFFERENTIAL/PLATELET
BASO%: 0.3 % (ref 0.0–2.0)
EOS%: 3.9 % (ref 0.0–7.0)
HCT: 35.6 % — ABNORMAL LOW (ref 38.4–49.9)
LYMPH%: 10.4 % — ABNORMAL LOW (ref 14.0–49.0)
MCH: 35.8 pg — ABNORMAL HIGH (ref 27.2–33.4)
MCHC: 34.8 g/dL (ref 32.0–36.0)
NEUT%: 77.9 % — ABNORMAL HIGH (ref 39.0–75.0)
RBC: 3.46 10*6/uL — ABNORMAL LOW (ref 4.20–5.82)
lymph#: 0.4 10*3/uL — ABNORMAL LOW (ref 0.9–3.3)

## 2009-04-11 LAB — UA PROTEIN, DIPSTICK - CHCC: Protein, Urine: <30 mg/dL

## 2009-04-11 LAB — TSH: TSH: 4.719 u[IU]/mL — ABNORMAL HIGH (ref 0.350–4.500)

## 2009-04-13 ENCOUNTER — Telehealth: Payer: Self-pay | Admitting: Internal Medicine

## 2009-04-14 ENCOUNTER — Ambulatory Visit: Payer: Self-pay | Admitting: Oncology

## 2009-04-18 ENCOUNTER — Encounter: Payer: Self-pay | Admitting: Internal Medicine

## 2009-04-18 ENCOUNTER — Ambulatory Visit: Payer: Self-pay | Admitting: Surgery

## 2009-04-18 ENCOUNTER — Encounter: Payer: Self-pay | Admitting: Oncology

## 2009-04-18 ENCOUNTER — Ambulatory Visit (HOSPITAL_COMMUNITY): Admission: RE | Admit: 2009-04-18 | Discharge: 2009-04-18 | Payer: Self-pay | Admitting: Oncology

## 2009-04-18 LAB — CBC WITH DIFFERENTIAL/PLATELET
Basophils Absolute: 0 10*3/uL (ref 0.0–0.1)
Eosinophils Absolute: 0.2 10*3/uL (ref 0.0–0.5)
HCT: 35 % — ABNORMAL LOW (ref 38.4–49.9)
HGB: 12.2 g/dL — ABNORMAL LOW (ref 13.0–17.1)
LYMPH%: 19.3 % (ref 14.0–49.0)
MCV: 103.2 fL — ABNORMAL HIGH (ref 79.3–98.0)
MONO%: 25.4 % — ABNORMAL HIGH (ref 0.0–14.0)
NEUT#: 2.2 10*3/uL (ref 1.5–6.5)
Platelets: 116 10*3/uL — ABNORMAL LOW (ref 140–400)
RDW: 14.1 % (ref 11.0–14.6)

## 2009-04-19 LAB — COMPREHENSIVE METABOLIC PANEL
Albumin: 4 g/dL (ref 3.5–5.2)
BUN: 18 mg/dL (ref 6–23)
CO2: 18 mEq/L — ABNORMAL LOW (ref 19–32)
Calcium: 8 mg/dL — ABNORMAL LOW (ref 8.4–10.5)
Chloride: 108 mEq/L (ref 96–112)
Glucose, Bld: 97 mg/dL (ref 70–99)
Potassium: 3.7 mEq/L (ref 3.5–5.3)
Sodium: 137 mEq/L (ref 135–145)
Total Protein: 6.5 g/dL (ref 6.0–8.3)

## 2009-04-19 LAB — LACTATE DEHYDROGENASE: LDH: 128 U/L (ref 94–250)

## 2009-04-25 ENCOUNTER — Encounter: Payer: Self-pay | Admitting: Internal Medicine

## 2009-04-25 LAB — COMPREHENSIVE METABOLIC PANEL
ALT: 17 U/L (ref 0–53)
Albumin: 4.1 g/dL (ref 3.5–5.2)
Alkaline Phosphatase: 55 U/L (ref 39–117)
CO2: 18 mEq/L — ABNORMAL LOW (ref 19–32)
Glucose, Bld: 92 mg/dL (ref 70–99)
Potassium: 3.9 mEq/L (ref 3.5–5.3)
Sodium: 140 mEq/L (ref 135–145)
Total Bilirubin: 0.5 mg/dL (ref 0.3–1.2)
Total Protein: 6.4 g/dL (ref 6.0–8.3)

## 2009-04-25 LAB — TSH: TSH: 5.197 u[IU]/mL — ABNORMAL HIGH (ref 0.350–4.500)

## 2009-04-25 LAB — UA PROTEIN, DIPSTICK - CHCC: Protein, Urine: <30 mg/dL

## 2009-04-25 LAB — CBC WITH DIFFERENTIAL/PLATELET
BASO%: 0.5 % (ref 0.0–2.0)
Eosinophils Absolute: 0.1 10*3/uL (ref 0.0–0.5)
LYMPH%: 20.3 % (ref 14.0–49.0)
MCHC: 35 g/dL (ref 32.0–36.0)
MONO#: 0.2 10*3/uL (ref 0.1–0.9)
NEUT#: 1.5 10*3/uL (ref 1.5–6.5)
Platelets: 110 10*3/uL — ABNORMAL LOW (ref 140–400)
RBC: 3.46 10*6/uL — ABNORMAL LOW (ref 4.20–5.82)
RDW: 13.6 % (ref 11.0–14.6)
WBC: 2.3 10*3/uL — ABNORMAL LOW (ref 4.0–10.3)

## 2009-05-02 LAB — CBC WITH DIFFERENTIAL/PLATELET
BASO%: 0.2 % (ref 0.0–2.0)
Basophils Absolute: 0 10*3/uL (ref 0.0–0.1)
EOS%: 3.2 % (ref 0.0–7.0)
HCT: 39.1 % (ref 38.4–49.9)
HGB: 13.2 g/dL (ref 13.0–17.1)
MCH: 34.3 pg — ABNORMAL HIGH (ref 27.2–33.4)
MCHC: 33.8 g/dL (ref 32.0–36.0)
MCV: 101.6 fL — ABNORMAL HIGH (ref 79.3–98.0)
MONO%: 25.1 % — ABNORMAL HIGH (ref 0.0–14.0)
NEUT%: 59.9 % (ref 39.0–75.0)

## 2009-05-02 LAB — COMPREHENSIVE METABOLIC PANEL
AST: 33 U/L (ref 0–37)
Albumin: 3.7 g/dL (ref 3.5–5.2)
Alkaline Phosphatase: 49 U/L (ref 39–117)
BUN: 15 mg/dL (ref 6–23)
Creatinine, Ser: 1.37 mg/dL (ref 0.40–1.50)
Glucose, Bld: 147 mg/dL — ABNORMAL HIGH (ref 70–99)
Total Bilirubin: 0.5 mg/dL (ref 0.3–1.2)

## 2009-05-05 ENCOUNTER — Telehealth: Payer: Self-pay | Admitting: Internal Medicine

## 2009-05-09 LAB — CBC WITH DIFFERENTIAL/PLATELET
Basophils Absolute: 0 10*3/uL (ref 0.0–0.1)
Eosinophils Absolute: 0.1 10*3/uL (ref 0.0–0.5)
HCT: 35.3 % — ABNORMAL LOW (ref 38.4–49.9)
HGB: 12.4 g/dL — ABNORMAL LOW (ref 13.0–17.1)
MCV: 102.7 fL — ABNORMAL HIGH (ref 79.3–98.0)
MONO%: 13.1 % (ref 0.0–14.0)
NEUT#: 2.1 10*3/uL (ref 1.5–6.5)
NEUT%: 69.3 % (ref 39.0–75.0)
Platelets: 77 10*3/uL — ABNORMAL LOW (ref 140–400)
RDW: 14 % (ref 11.0–14.6)

## 2009-05-09 LAB — COMPREHENSIVE METABOLIC PANEL
Albumin: 4.1 g/dL (ref 3.5–5.2)
Alkaline Phosphatase: 58 U/L (ref 39–117)
BUN: 20 mg/dL (ref 6–23)
Calcium: 8.3 mg/dL — ABNORMAL LOW (ref 8.4–10.5)
Creatinine, Ser: 1.33 mg/dL (ref 0.40–1.50)
Glucose, Bld: 117 mg/dL — ABNORMAL HIGH (ref 70–99)
Potassium: 3.9 mEq/L (ref 3.5–5.3)

## 2009-05-09 LAB — UA PROTEIN, DIPSTICK - CHCC: Protein, Urine: <30 mg/dL

## 2009-05-16 ENCOUNTER — Ambulatory Visit: Payer: Self-pay | Admitting: Oncology

## 2009-05-16 LAB — UA PROTEIN, DIPSTICK - CHCC: Protein, Urine: <30 mg/dL

## 2009-05-16 LAB — CBC WITH DIFFERENTIAL/PLATELET
BASO%: 0.3 % (ref 0.0–2.0)
Eosinophils Absolute: 0.1 10*3/uL (ref 0.0–0.5)
LYMPH%: 25.5 % (ref 14.0–49.0)
MCHC: 34.1 g/dL (ref 32.0–36.0)
MONO#: 0.9 10*3/uL (ref 0.1–0.9)
NEUT#: 1.5 10*3/uL (ref 1.5–6.5)
Platelets: 72 10*3/uL — ABNORMAL LOW (ref 140–400)
RBC: 3.63 10*6/uL — ABNORMAL LOW (ref 4.20–5.82)
WBC: 3.4 10*3/uL — ABNORMAL LOW (ref 4.0–10.3)
lymph#: 0.9 10*3/uL (ref 0.9–3.3)
nRBC: 0 % (ref 0–0)

## 2009-05-16 LAB — COMPREHENSIVE METABOLIC PANEL
ALT: 34 U/L (ref 0–53)
CO2: 19 mEq/L (ref 19–32)
Calcium: 8.5 mg/dL (ref 8.4–10.5)
Chloride: 105 mEq/L (ref 96–112)
Creatinine, Ser: 1.18 mg/dL (ref 0.40–1.50)
Glucose, Bld: 89 mg/dL (ref 70–99)
Total Bilirubin: 0.7 mg/dL (ref 0.3–1.2)
Total Protein: 6.6 g/dL (ref 6.0–8.3)

## 2009-05-24 LAB — CBC WITH DIFFERENTIAL/PLATELET
Eosinophils Absolute: 0.1 10*3/uL (ref 0.0–0.5)
MCV: 103.6 fL — ABNORMAL HIGH (ref 79.3–98.0)
MONO#: 0.5 10*3/uL (ref 0.1–0.9)
MONO%: 14.4 % — ABNORMAL HIGH (ref 0.0–14.0)
NEUT#: 2.1 10*3/uL (ref 1.5–6.5)
RBC: 3.34 10*6/uL — ABNORMAL LOW (ref 4.20–5.82)
RDW: 14.8 % — ABNORMAL HIGH (ref 11.0–14.6)
WBC: 3.2 10*3/uL — ABNORMAL LOW (ref 4.0–10.3)

## 2009-05-24 LAB — UA PROTEIN, DIPSTICK - CHCC: Protein, Urine: 30 mg/dL

## 2009-05-24 LAB — TSH: TSH: 3.557 u[IU]/mL (ref 0.350–4.500)

## 2009-05-25 ENCOUNTER — Encounter: Payer: Self-pay | Admitting: Internal Medicine

## 2009-05-25 ENCOUNTER — Ambulatory Visit (HOSPITAL_COMMUNITY): Admission: RE | Admit: 2009-05-25 | Discharge: 2009-05-25 | Payer: Self-pay | Admitting: Oncology

## 2009-06-05 ENCOUNTER — Telehealth (INDEPENDENT_AMBULATORY_CARE_PROVIDER_SITE_OTHER): Payer: Self-pay | Admitting: *Deleted

## 2009-06-06 LAB — CBC WITH DIFFERENTIAL/PLATELET
BASO%: 0.3 % (ref 0.0–2.0)
Basophils Absolute: 0 10*3/uL (ref 0.0–0.1)
EOS%: 1.3 % (ref 0.0–7.0)
HGB: 11.7 g/dL — ABNORMAL LOW (ref 13.0–17.1)
MCH: 35.6 pg — ABNORMAL HIGH (ref 27.2–33.4)
RBC: 3.29 10*6/uL — ABNORMAL LOW (ref 4.20–5.82)
RDW: 15.3 % — ABNORMAL HIGH (ref 11.0–14.6)
lymph#: 0.2 10*3/uL — ABNORMAL LOW (ref 0.9–3.3)

## 2009-06-09 LAB — CBC WITH DIFFERENTIAL/PLATELET
Basophils Absolute: 0 10*3/uL (ref 0.0–0.1)
EOS%: 1.4 % (ref 0.0–7.0)
Eosinophils Absolute: 0 10*3/uL (ref 0.0–0.5)
HCT: 34.4 % — ABNORMAL LOW (ref 38.4–49.9)
HGB: 11.7 g/dL — ABNORMAL LOW (ref 13.0–17.1)
MCH: 34 pg — ABNORMAL HIGH (ref 27.2–33.4)
MCV: 100 fL — ABNORMAL HIGH (ref 79.3–98.0)
MONO%: 9.7 % (ref 0.0–14.0)
NEUT#: 0.8 10*3/uL — ABNORMAL LOW (ref 1.5–6.5)
NEUT%: 57 % (ref 39.0–75.0)
RDW: 14 % (ref 11.0–14.6)
lymph#: 0.5 10*3/uL — ABNORMAL LOW (ref 0.9–3.3)

## 2009-06-13 ENCOUNTER — Encounter: Payer: Self-pay | Admitting: Internal Medicine

## 2009-06-13 LAB — CBC WITH DIFFERENTIAL/PLATELET
BASO%: 0 % (ref 0.0–2.0)
Eosinophils Absolute: 0 10*3/uL (ref 0.0–0.5)
LYMPH%: 38.2 % (ref 14.0–49.0)
MCHC: 33.8 g/dL (ref 32.0–36.0)
MONO#: 0.1 10*3/uL (ref 0.1–0.9)
NEUT#: 0.3 10*3/uL — CL (ref 1.5–6.5)
RBC: 3 10*6/uL — ABNORMAL LOW (ref 4.20–5.82)
RDW: 14.3 % (ref 11.0–14.6)
WBC: 0.7 10*3/uL — CL (ref 4.0–10.3)
lymph#: 0.3 10*3/uL — ABNORMAL LOW (ref 0.9–3.3)
nRBC: 0 % (ref 0–0)

## 2009-06-13 LAB — UA PROTEIN, DIPSTICK - CHCC: Protein, Urine: <30 mg/dL

## 2009-06-13 LAB — TSH: TSH: 0.819 u[IU]/mL (ref 0.350–4.500)

## 2009-06-13 LAB — COMPREHENSIVE METABOLIC PANEL
Albumin: 3.8 g/dL (ref 3.5–5.2)
Alkaline Phosphatase: 39 U/L (ref 39–117)
BUN: 33 mg/dL — ABNORMAL HIGH (ref 6–23)
CO2: 21 mEq/L (ref 19–32)
Glucose, Bld: 120 mg/dL — ABNORMAL HIGH (ref 70–99)
Potassium: 4.9 mEq/L (ref 3.5–5.3)
Sodium: 136 mEq/L (ref 135–145)
Total Protein: 5.7 g/dL — ABNORMAL LOW (ref 6.0–8.3)

## 2009-06-14 LAB — CBC WITH DIFFERENTIAL/PLATELET
Eosinophils Absolute: 0 10*3/uL (ref 0.0–0.5)
HGB: 10.2 g/dL — ABNORMAL LOW (ref 13.0–17.1)
MCV: 102.3 fL — ABNORMAL HIGH (ref 79.3–98.0)
MONO%: 14 % (ref 0.0–14.0)
NEUT#: 0.2 10*3/uL — CL (ref 1.5–6.5)
RBC: 3.02 10*6/uL — ABNORMAL LOW (ref 4.20–5.82)
RDW: 14.3 % (ref 11.0–14.6)
WBC: 0.9 10*3/uL — CL (ref 4.0–10.3)
lymph#: 0.5 10*3/uL — ABNORMAL LOW (ref 0.9–3.3)

## 2009-06-15 ENCOUNTER — Ambulatory Visit: Payer: Self-pay | Admitting: Oncology

## 2009-06-15 ENCOUNTER — Encounter (HOSPITAL_COMMUNITY): Admission: RE | Admit: 2009-06-15 | Discharge: 2009-09-13 | Payer: Self-pay | Admitting: Oncology

## 2009-06-15 LAB — CBC WITH DIFFERENTIAL/PLATELET
Basophils Absolute: 0 10*3/uL (ref 0.0–0.1)
Eosinophils Absolute: 0 10*3/uL (ref 0.0–0.5)
HGB: 10.3 g/dL — ABNORMAL LOW (ref 13.0–17.1)
LYMPH%: 47.5 % (ref 14.0–49.0)
MONO#: 0.1 10*3/uL (ref 0.1–0.9)
NEUT#: 0.2 10*3/uL — CL (ref 1.5–6.5)
Platelets: 11 10*3/uL — ABNORMAL LOW (ref 140–400)
RBC: 3.06 10*6/uL — ABNORMAL LOW (ref 4.20–5.82)
RDW: 14.2 % (ref 11.0–14.6)
WBC: 0.6 10*3/uL — CL (ref 4.0–10.3)
nRBC: 0 % (ref 0–0)

## 2009-06-15 LAB — TYPE & CROSSMATCH - CHCC

## 2009-06-16 LAB — CBC WITH DIFFERENTIAL/PLATELET
Basophils Absolute: 0 10*3/uL (ref 0.0–0.1)
EOS%: 5.7 % (ref 0.0–7.0)
Eosinophils Absolute: 0 10*3/uL (ref 0.0–0.5)
HCT: 29 % — ABNORMAL LOW (ref 38.4–49.9)
HGB: 9.6 g/dL — ABNORMAL LOW (ref 13.0–17.1)
MCH: 34 pg — ABNORMAL HIGH (ref 27.2–33.4)
MCHC: 33.1 g/dL (ref 32.0–36.0)
MONO%: 18.9 % — ABNORMAL HIGH (ref 0.0–14.0)
NEUT#: 0.2 10*3/uL — CL (ref 1.5–6.5)
NEUT%: 35.8 % — ABNORMAL LOW (ref 39.0–75.0)
Platelets: 47 10*3/uL — ABNORMAL LOW (ref 140–400)
RDW: 14.1 % (ref 11.0–14.6)
lymph#: 0.2 10*3/uL — ABNORMAL LOW (ref 0.9–3.3)

## 2009-06-19 LAB — CBC WITH DIFFERENTIAL/PLATELET
Basophils Absolute: 0 10*3/uL (ref 0.0–0.1)
Eosinophils Absolute: 0 10*3/uL (ref 0.0–0.5)
HGB: 10.5 g/dL — ABNORMAL LOW (ref 13.0–17.1)
LYMPH%: 39.5 % (ref 14.0–49.0)
MCV: 102.6 fL — ABNORMAL HIGH (ref 79.3–98.0)
MONO#: 0.1 10*3/uL (ref 0.1–0.9)
MONO%: 16.1 % — ABNORMAL HIGH (ref 0.0–14.0)
NEUT#: 0.3 10*3/uL — CL (ref 1.5–6.5)
Platelets: 25 10*3/uL — ABNORMAL LOW (ref 140–400)
RDW: 15.1 % — ABNORMAL HIGH (ref 11.0–14.6)
WBC: 0.7 10*3/uL — CL (ref 4.0–10.3)

## 2009-06-19 LAB — COMPREHENSIVE METABOLIC PANEL
Albumin: 3.8 g/dL (ref 3.5–5.2)
Alkaline Phosphatase: 50 U/L (ref 39–117)
BUN: 22 mg/dL (ref 6–23)
CO2: 19 mEq/L (ref 19–32)
Glucose, Bld: 123 mg/dL — ABNORMAL HIGH (ref 70–99)
Potassium: 3.9 mEq/L (ref 3.5–5.3)
Sodium: 139 mEq/L (ref 135–145)
Total Protein: 5.9 g/dL — ABNORMAL LOW (ref 6.0–8.3)

## 2009-06-20 ENCOUNTER — Encounter: Payer: Self-pay | Admitting: Internal Medicine

## 2009-06-20 ENCOUNTER — Ambulatory Visit (HOSPITAL_COMMUNITY): Admission: RE | Admit: 2009-06-20 | Discharge: 2009-06-20 | Payer: Self-pay | Admitting: Oncology

## 2009-06-20 LAB — MANUAL DIFFERENTIAL
ALC: 0.4 10*3/uL — ABNORMAL LOW (ref 0.9–3.3)
ANC (CHCC manual diff): 0.4 10*3/uL — CL (ref 1.5–6.5)
Band Neutrophils: 6 % (ref 0–10)
Blasts: 0 % (ref 0–0)
EOS: 1 % (ref 0–7)
Myelocytes: 0 % (ref 0–0)
Other Cell: 0 % (ref 0–0)
PROMYELO: 0 % (ref 0–0)
SEG: 38 % (ref 38–77)
Variant Lymph: 0 % (ref 0–0)
nRBC: 1 % — ABNORMAL HIGH (ref 0–0)

## 2009-06-20 LAB — CBC WITH DIFFERENTIAL/PLATELET
HCT: 30.5 % — ABNORMAL LOW (ref 38.4–49.9)
HGB: 10.2 g/dL — ABNORMAL LOW (ref 13.0–17.1)
MCH: 33.9 pg — ABNORMAL HIGH (ref 27.2–33.4)
RDW: 14.4 % (ref 11.0–14.6)
WBC: 1 10*3/uL — ABNORMAL LOW (ref 4.0–10.3)
nRBC: 2 % — ABNORMAL HIGH (ref 0–0)

## 2009-06-22 LAB — CBC WITH DIFFERENTIAL/PLATELET
BASO%: 0.5 % (ref 0.0–2.0)
Basophils Absolute: 0 10*3/uL (ref 0.0–0.1)
EOS%: 0.4 % (ref 0.0–7.0)
Eosinophils Absolute: 0 10*3/uL (ref 0.0–0.5)
HCT: 28.2 % — ABNORMAL LOW (ref 38.4–49.9)
HGB: 9.9 g/dL — ABNORMAL LOW (ref 13.0–17.1)
LYMPH%: 35.3 % (ref 14.0–49.0)
MCH: 36 pg — ABNORMAL HIGH (ref 27.2–33.4)
MCHC: 35.2 g/dL (ref 32.0–36.0)
MCV: 102.3 fL — ABNORMAL HIGH (ref 79.3–98.0)
MONO#: 0.2 10*3/uL (ref 0.1–0.9)
MONO%: 24.7 % — ABNORMAL HIGH (ref 0.0–14.0)
NEUT#: 0.4 10*3/uL — CL (ref 1.5–6.5)
NEUT%: 39.1 % (ref 39.0–75.0)
Platelets: 17 10*3/uL — ABNORMAL LOW (ref 140–400)
RBC: 2.75 10*6/uL — ABNORMAL LOW (ref 4.20–5.82)
RDW: 15.5 % — ABNORMAL HIGH (ref 11.0–14.6)
WBC: 1 10*3/uL — ABNORMAL LOW (ref 4.0–10.3)
lymph#: 0.3 10*3/uL — ABNORMAL LOW (ref 0.9–3.3)

## 2009-06-23 ENCOUNTER — Encounter: Payer: Self-pay | Admitting: Internal Medicine

## 2009-06-23 ENCOUNTER — Ambulatory Visit (HOSPITAL_COMMUNITY): Admission: RE | Admit: 2009-06-23 | Discharge: 2009-06-23 | Payer: Self-pay | Admitting: Oncology

## 2009-06-23 LAB — CBC WITH DIFFERENTIAL/PLATELET
Basophils Absolute: 0 10*3/uL (ref 0.0–0.1)
EOS%: 0 % (ref 0.0–7.0)
Eosinophils Absolute: 0 10*3/uL (ref 0.0–0.5)
HGB: 9.8 g/dL — ABNORMAL LOW (ref 13.0–17.1)
LYMPH%: 37.6 % (ref 14.0–49.0)
MCH: 33.8 pg — ABNORMAL HIGH (ref 27.2–33.4)
MCV: 100.7 fL — ABNORMAL HIGH (ref 79.3–98.0)
MONO%: 22.2 % — ABNORMAL HIGH (ref 0.0–14.0)
NEUT#: 0.5 10*3/uL — ABNORMAL LOW (ref 1.5–6.5)
Platelets: 18 10*3/uL — ABNORMAL LOW (ref 140–400)
RBC: 2.9 10*6/uL — ABNORMAL LOW (ref 4.20–5.82)
RDW: 14.5 % (ref 11.0–14.6)

## 2009-06-30 ENCOUNTER — Encounter: Payer: Self-pay | Admitting: Internal Medicine

## 2009-06-30 LAB — CBC WITH DIFFERENTIAL/PLATELET
BASO%: 0.3 % (ref 0.0–2.0)
EOS%: 0.3 % (ref 0.0–7.0)
HCT: 27.3 % — ABNORMAL LOW (ref 38.4–49.9)
LYMPH%: 28 % (ref 14.0–49.0)
MCH: 33.6 pg — ABNORMAL HIGH (ref 27.2–33.4)
MCHC: 33 g/dL (ref 32.0–36.0)
MCV: 101.9 fL — ABNORMAL HIGH (ref 79.3–98.0)
MONO%: 24.8 % — ABNORMAL HIGH (ref 0.0–14.0)
NEUT%: 46.6 % (ref 39.0–75.0)
Platelets: 73 10*3/uL — ABNORMAL LOW (ref 140–400)
RBC: 2.68 10*6/uL — ABNORMAL LOW (ref 4.20–5.82)

## 2009-07-03 LAB — CBC WITH DIFFERENTIAL/PLATELET
BASO%: 0.6 % (ref 0.0–2.0)
Basophils Absolute: 0 10*3/uL (ref 0.0–0.1)
EOS%: 0.6 % (ref 0.0–7.0)
HGB: 9.4 g/dL — ABNORMAL LOW (ref 13.0–17.1)
MCH: 33.5 pg — ABNORMAL HIGH (ref 27.2–33.4)
MCHC: 32.9 g/dL (ref 32.0–36.0)
MCV: 101.8 fL — ABNORMAL HIGH (ref 79.3–98.0)
MONO%: 21.8 % — ABNORMAL HIGH (ref 0.0–14.0)
RBC: 2.81 10*6/uL — ABNORMAL LOW (ref 4.20–5.82)
RDW: 15.7 % — ABNORMAL HIGH (ref 11.0–14.6)

## 2009-07-03 LAB — COMPREHENSIVE METABOLIC PANEL
ALT: 36 U/L (ref 0–53)
AST: 30 U/L (ref 0–37)
Albumin: 3.2 g/dL — ABNORMAL LOW (ref 3.5–5.2)
Alkaline Phosphatase: 74 U/L (ref 39–117)
BUN: 13 mg/dL (ref 6–23)
CO2: 25 mEq/L (ref 19–32)
Potassium: 4 mEq/L (ref 3.5–5.3)
Sodium: 140 mEq/L (ref 135–145)
Total Protein: 6.5 g/dL (ref 6.0–8.3)

## 2009-07-03 LAB — UA PROTEIN, DIPSTICK - CHCC: Protein, Urine: NEGATIVE mg/dL

## 2009-07-07 LAB — CBC WITH DIFFERENTIAL/PLATELET
Basophils Absolute: 0 10*3/uL (ref 0.0–0.1)
EOS%: 0.4 % (ref 0.0–7.0)
HCT: 27.5 % — ABNORMAL LOW (ref 38.4–49.9)
HGB: 9.5 g/dL — ABNORMAL LOW (ref 13.0–17.1)
MCH: 35.5 pg — ABNORMAL HIGH (ref 27.2–33.4)
MCV: 102.6 fL — ABNORMAL HIGH (ref 79.3–98.0)
MONO%: 10.9 % (ref 0.0–14.0)
NEUT%: 83.4 % — ABNORMAL HIGH (ref 39.0–75.0)
RDW: 17.1 % — ABNORMAL HIGH (ref 11.0–14.6)

## 2009-07-14 LAB — CBC WITH DIFFERENTIAL/PLATELET
EOS%: 2.7 % (ref 0.0–7.0)
MCH: 36.4 pg — ABNORMAL HIGH (ref 27.2–33.4)
MCHC: 34.5 g/dL (ref 32.0–36.0)
MCV: 105.4 fL — ABNORMAL HIGH (ref 79.3–98.0)
MONO%: 8 % (ref 0.0–14.0)
RBC: 2.75 10*6/uL — ABNORMAL LOW (ref 4.20–5.82)
RDW: 20.2 % — ABNORMAL HIGH (ref 11.0–14.6)

## 2009-07-18 ENCOUNTER — Ambulatory Visit: Payer: Self-pay | Admitting: Oncology

## 2009-07-19 ENCOUNTER — Encounter: Payer: Self-pay | Admitting: Internal Medicine

## 2009-07-19 LAB — CBC WITH DIFFERENTIAL/PLATELET
Eosinophils Absolute: 0.3 10*3/uL (ref 0.0–0.5)
MONO#: 0.9 10*3/uL (ref 0.1–0.9)
NEUT#: 3 10*3/uL (ref 1.5–6.5)
Platelets: 157 10*3/uL (ref 140–400)
RBC: 2.74 10*6/uL — ABNORMAL LOW (ref 4.20–5.82)
RDW: 18.2 % — ABNORMAL HIGH (ref 11.0–14.6)
WBC: 5.3 10*3/uL (ref 4.0–10.3)

## 2009-07-19 LAB — COMPREHENSIVE METABOLIC PANEL
ALT: 27 U/L (ref 0–53)
CO2: 24 mEq/L (ref 19–32)
Calcium: 8.3 mg/dL — ABNORMAL LOW (ref 8.4–10.5)
Chloride: 112 mEq/L (ref 96–112)
Potassium: 3.6 mEq/L (ref 3.5–5.3)
Sodium: 141 mEq/L (ref 135–145)
Total Protein: 5.8 g/dL — ABNORMAL LOW (ref 6.0–8.3)

## 2009-07-19 LAB — UA PROTEIN, DIPSTICK - CHCC: Protein, Urine: <30 mg/dL

## 2009-08-02 ENCOUNTER — Encounter: Payer: Self-pay | Admitting: Internal Medicine

## 2009-08-02 LAB — CBC WITH DIFFERENTIAL/PLATELET
Eosinophils Absolute: 0.2 10*3/uL (ref 0.0–0.5)
LYMPH%: 19.8 % (ref 14.0–49.0)
MCV: 108.9 fL — ABNORMAL HIGH (ref 79.3–98.0)
MONO%: 17.8 % — ABNORMAL HIGH (ref 0.0–14.0)
NEUT#: 2.5 10*3/uL (ref 1.5–6.5)
NEUT%: 56.3 % (ref 39.0–75.0)
Platelets: 141 10*3/uL (ref 140–400)
RBC: 3.03 10*6/uL — ABNORMAL LOW (ref 4.20–5.82)

## 2009-08-02 LAB — COMPREHENSIVE METABOLIC PANEL
ALT: 30 U/L (ref 0–53)
Alkaline Phosphatase: 96 U/L (ref 39–117)
Glucose, Bld: 94 mg/dL (ref 70–99)
Sodium: 140 mEq/L (ref 135–145)
Total Bilirubin: 0.7 mg/dL (ref 0.3–1.2)
Total Protein: 6.8 g/dL (ref 6.0–8.3)

## 2009-08-02 LAB — UA PROTEIN, DIPSTICK - CHCC: Protein, Urine: NEGATIVE mg/dL

## 2009-08-11 ENCOUNTER — Telehealth: Payer: Self-pay | Admitting: Internal Medicine

## 2009-08-16 LAB — CBC WITH DIFFERENTIAL/PLATELET
Basophils Absolute: 0 10*3/uL (ref 0.0–0.1)
Eosinophils Absolute: 0.2 10*3/uL (ref 0.0–0.5)
HCT: 34.3 % — ABNORMAL LOW (ref 38.4–49.9)
HGB: 11.2 g/dL — ABNORMAL LOW (ref 13.0–17.1)
MONO#: 0.8 10*3/uL (ref 0.1–0.9)
NEUT#: 3.9 10*3/uL (ref 1.5–6.5)
NEUT%: 63.4 % (ref 39.0–75.0)
RDW: 15.3 % — ABNORMAL HIGH (ref 11.0–14.6)
WBC: 6.1 10*3/uL (ref 4.0–10.3)
lymph#: 1.2 10*3/uL (ref 0.9–3.3)

## 2009-08-16 LAB — COMPREHENSIVE METABOLIC PANEL
AST: 21 U/L (ref 0–37)
BUN: 12 mg/dL (ref 6–23)
CO2: 24 mEq/L (ref 19–32)
Calcium: 8.4 mg/dL (ref 8.4–10.5)
Chloride: 107 mEq/L (ref 96–112)
Creatinine, Ser: 1.06 mg/dL (ref 0.40–1.50)
Glucose, Bld: 98 mg/dL (ref 70–99)

## 2009-08-17 ENCOUNTER — Ambulatory Visit: Payer: Self-pay | Admitting: Oncology

## 2009-08-30 ENCOUNTER — Encounter: Payer: Self-pay | Admitting: Internal Medicine

## 2009-09-13 LAB — COMPREHENSIVE METABOLIC PANEL
Albumin: 3.7 g/dL (ref 3.5–5.2)
Alkaline Phosphatase: 99 U/L (ref 39–117)
CO2: 25 mEq/L (ref 19–32)
Calcium: 8.7 mg/dL (ref 8.4–10.5)
Chloride: 108 mEq/L (ref 96–112)
Glucose, Bld: 95 mg/dL (ref 70–99)
Potassium: 3.8 mEq/L (ref 3.5–5.3)
Sodium: 140 mEq/L (ref 135–145)
Total Protein: 6 g/dL (ref 6.0–8.3)

## 2009-09-13 LAB — CBC WITH DIFFERENTIAL/PLATELET
EOS%: 5 % (ref 0.0–7.0)
Eosinophils Absolute: 0.2 10*3/uL (ref 0.0–0.5)
MCV: 106.3 fL — ABNORMAL HIGH (ref 79.3–98.0)
MONO%: 18.3 % — ABNORMAL HIGH (ref 0.0–14.0)
NEUT#: 2.6 10*3/uL (ref 1.5–6.5)
RBC: 3.35 10*6/uL — ABNORMAL LOW (ref 4.20–5.82)
RDW: 14.6 % (ref 11.0–14.6)
lymph#: 1 10*3/uL (ref 0.9–3.3)

## 2009-09-15 ENCOUNTER — Ambulatory Visit: Payer: Self-pay | Admitting: Internal Medicine

## 2009-09-22 ENCOUNTER — Ambulatory Visit: Payer: Self-pay | Admitting: Oncology

## 2009-09-26 ENCOUNTER — Encounter: Payer: Self-pay | Admitting: Internal Medicine

## 2009-09-26 LAB — COMPREHENSIVE METABOLIC PANEL
AST: 22 U/L (ref 0–37)
Albumin: 4 g/dL (ref 3.5–5.2)
Alkaline Phosphatase: 106 U/L (ref 39–117)
Glucose, Bld: 92 mg/dL (ref 70–99)
Potassium: 3.6 mEq/L (ref 3.5–5.3)
Sodium: 141 mEq/L (ref 135–145)
Total Bilirubin: 0.7 mg/dL (ref 0.3–1.2)
Total Protein: 6.7 g/dL (ref 6.0–8.3)

## 2009-09-26 LAB — CBC WITH DIFFERENTIAL/PLATELET
BASO%: 0.5 % (ref 0.0–2.0)
EOS%: 3.5 % (ref 0.0–7.0)
HGB: 12.1 g/dL — ABNORMAL LOW (ref 13.0–17.1)
MCH: 34.6 pg — ABNORMAL HIGH (ref 27.2–33.4)
MCHC: 33 g/dL (ref 32.0–36.0)
MCV: 104.9 fL — ABNORMAL HIGH (ref 79.3–98.0)
MONO%: 16 % — ABNORMAL HIGH (ref 0.0–14.0)
RBC: 3.5 10*6/uL — ABNORMAL LOW (ref 4.20–5.82)
RDW: 14.4 % (ref 11.0–14.6)
lymph#: 1.1 10*3/uL (ref 0.9–3.3)

## 2009-09-26 LAB — UA PROTEIN, DIPSTICK - CHCC: Protein, Urine: <30 mg/dL

## 2009-10-10 LAB — CBC WITH DIFFERENTIAL/PLATELET
Eosinophils Absolute: 0.2 10*3/uL (ref 0.0–0.5)
HCT: 35.4 % — ABNORMAL LOW (ref 38.4–49.9)
LYMPH%: 19.2 % (ref 14.0–49.0)
MCHC: 32.2 g/dL (ref 32.0–36.0)
MCV: 105 fL — ABNORMAL HIGH (ref 79.3–98.0)
MONO#: 1 10*3/uL — ABNORMAL HIGH (ref 0.1–0.9)
MONO%: 17.3 % — ABNORMAL HIGH (ref 0.0–14.0)
NEUT%: 59.9 % (ref 39.0–75.0)
Platelets: 82 10*3/uL — ABNORMAL LOW (ref 140–400)
RBC: 3.37 10*6/uL — ABNORMAL LOW (ref 4.20–5.82)
WBC: 5.8 10*3/uL (ref 4.0–10.3)

## 2009-10-12 ENCOUNTER — Ambulatory Visit (HOSPITAL_COMMUNITY): Admission: RE | Admit: 2009-10-12 | Discharge: 2009-10-12 | Payer: Self-pay | Admitting: Oncology

## 2009-10-12 ENCOUNTER — Encounter: Payer: Self-pay | Admitting: Internal Medicine

## 2009-10-17 LAB — CBC WITH DIFFERENTIAL/PLATELET
Basophils Absolute: 0.1 10*3/uL (ref 0.0–0.1)
EOS%: 2.3 % (ref 0.0–7.0)
Eosinophils Absolute: 0.1 10*3/uL (ref 0.0–0.5)
HCT: 36.8 % — ABNORMAL LOW (ref 38.4–49.9)
HGB: 12 g/dL — ABNORMAL LOW (ref 13.0–17.1)
MONO#: 1 10*3/uL — ABNORMAL HIGH (ref 0.1–0.9)
NEUT#: 3.3 10*3/uL (ref 1.5–6.5)
RDW: 14.2 % (ref 11.0–14.6)
WBC: 5.7 10*3/uL (ref 4.0–10.3)
lymph#: 1.3 10*3/uL (ref 0.9–3.3)

## 2009-10-17 LAB — COMPREHENSIVE METABOLIC PANEL
Albumin: 4.2 g/dL (ref 3.5–5.2)
BUN: 20 mg/dL (ref 6–23)
Calcium: 8.4 mg/dL (ref 8.4–10.5)
Chloride: 107 mEq/L (ref 96–112)
Creatinine, Ser: 0.92 mg/dL (ref 0.40–1.50)
Glucose, Bld: 95 mg/dL (ref 70–99)
Potassium: 3.8 mEq/L (ref 3.5–5.3)

## 2009-10-24 ENCOUNTER — Ambulatory Visit: Payer: Self-pay | Admitting: Oncology

## 2009-10-24 ENCOUNTER — Encounter: Payer: Self-pay | Admitting: Internal Medicine

## 2009-10-24 LAB — CBC WITH DIFFERENTIAL/PLATELET
Basophils Absolute: 0.3 10*3/uL — ABNORMAL HIGH (ref 0.0–0.1)
Eosinophils Absolute: 0.1 10*3/uL (ref 0.0–0.5)
HCT: 34.4 % — ABNORMAL LOW (ref 38.4–49.9)
HGB: 11.7 g/dL — ABNORMAL LOW (ref 13.0–17.1)
LYMPH%: 10.7 % — ABNORMAL LOW (ref 14.0–49.0)
MONO#: 1 10*3/uL — ABNORMAL HIGH (ref 0.1–0.9)
NEUT#: 8.3 10*3/uL — ABNORMAL HIGH (ref 1.5–6.5)
NEUT%: 76.4 % — ABNORMAL HIGH (ref 39.0–75.0)
Platelets: 88 10*3/uL — ABNORMAL LOW (ref 140–400)
WBC: 10.9 10*3/uL — ABNORMAL HIGH (ref 4.0–10.3)

## 2009-10-24 LAB — COMPREHENSIVE METABOLIC PANEL
CO2: 21 mEq/L (ref 19–32)
Calcium: 8.4 mg/dL (ref 8.4–10.5)
Creatinine, Ser: 0.94 mg/dL (ref 0.40–1.50)
Glucose, Bld: 96 mg/dL (ref 70–99)
Total Bilirubin: 0.3 mg/dL (ref 0.3–1.2)

## 2009-11-14 ENCOUNTER — Encounter: Payer: Self-pay | Admitting: Internal Medicine

## 2009-11-14 LAB — CBC WITH DIFFERENTIAL/PLATELET
Eosinophils Absolute: 0.2 10*3/uL (ref 0.0–0.5)
HCT: 34.6 % — ABNORMAL LOW (ref 38.4–49.9)
LYMPH%: 25.2 % (ref 14.0–49.0)
MONO#: 0.7 10*3/uL (ref 0.1–0.9)
NEUT#: 2.3 10*3/uL (ref 1.5–6.5)
Platelets: 95 10*3/uL — ABNORMAL LOW (ref 140–400)
RBC: 3.34 10*6/uL — ABNORMAL LOW (ref 4.20–5.82)
WBC: 4.3 10*3/uL (ref 4.0–10.3)
lymph#: 1.1 10*3/uL (ref 0.9–3.3)
nRBC: 0 % (ref 0–0)

## 2009-11-14 LAB — COMPREHENSIVE METABOLIC PANEL
ALT: 39 U/L (ref 0–53)
AST: 26 U/L (ref 0–37)
Albumin: 4.1 g/dL (ref 3.5–5.2)
Alkaline Phosphatase: 110 U/L (ref 39–117)
BUN: 12 mg/dL (ref 6–23)
Calcium: 8.8 mg/dL (ref 8.4–10.5)
Chloride: 110 mEq/L (ref 96–112)
Potassium: 3.4 mEq/L — ABNORMAL LOW (ref 3.5–5.3)
Sodium: 140 mEq/L (ref 135–145)
Total Protein: 6.6 g/dL (ref 6.0–8.3)

## 2009-11-24 ENCOUNTER — Ambulatory Visit: Payer: Self-pay | Admitting: Oncology

## 2009-11-28 LAB — CBC WITH DIFFERENTIAL/PLATELET
BASO%: 0.5 % (ref 0.0–2.0)
EOS%: 2.6 % (ref 0.0–7.0)
HGB: 11.1 g/dL — ABNORMAL LOW (ref 13.0–17.1)
LYMPH%: 20 % (ref 14.0–49.0)
MONO#: 0.9 10*3/uL (ref 0.1–0.9)
NEUT#: 3.8 10*3/uL (ref 1.5–6.5)
Platelets: 107 10*3/uL — ABNORMAL LOW (ref 140–400)
RBC: 3.34 10*6/uL — ABNORMAL LOW (ref 4.20–5.82)
RDW: 15.3 % — ABNORMAL HIGH (ref 11.0–14.6)
WBC: 6.1 10*3/uL (ref 4.0–10.3)
lymph#: 1.2 10*3/uL (ref 0.9–3.3)

## 2009-11-28 LAB — UA PROTEIN, DIPSTICK - CHCC: Protein, Urine: 30 mg/dL

## 2009-11-28 LAB — COMPREHENSIVE METABOLIC PANEL
Albumin: 3.7 g/dL (ref 3.5–5.2)
Calcium: 8.5 mg/dL (ref 8.4–10.5)
Creatinine, Ser: 1.09 mg/dL (ref 0.40–1.50)
Glucose, Bld: 135 mg/dL — ABNORMAL HIGH (ref 70–99)
Sodium: 142 mEq/L (ref 135–145)

## 2009-12-12 ENCOUNTER — Encounter: Payer: Self-pay | Admitting: Internal Medicine

## 2009-12-12 LAB — COMPREHENSIVE METABOLIC PANEL
BUN: 15 mg/dL (ref 6–23)
Calcium: 8.7 mg/dL (ref 8.4–10.5)
Creatinine, Ser: 0.95 mg/dL (ref 0.40–1.50)
Potassium: 3.7 mEq/L (ref 3.5–5.3)
Sodium: 141 mEq/L (ref 135–145)

## 2009-12-12 LAB — CBC WITH DIFFERENTIAL/PLATELET
BASO%: 1 % (ref 0.0–2.0)
Basophils Absolute: 0.1 10*3/uL (ref 0.0–0.1)
EOS%: 3.6 % (ref 0.0–7.0)
HGB: 11.5 g/dL — ABNORMAL LOW (ref 13.0–17.1)
MCV: 103.6 fL — ABNORMAL HIGH (ref 79.3–98.0)
NEUT#: 2.9 10*3/uL (ref 1.5–6.5)
NEUT%: 56.4 % (ref 39.0–75.0)
Platelets: 126 10*3/uL — ABNORMAL LOW (ref 140–400)
WBC: 5.1 10*3/uL (ref 4.0–10.3)
nRBC: 0 % (ref 0–0)

## 2009-12-12 LAB — UA PROTEIN, DIPSTICK - CHCC: Protein, Urine: 30 mg/dL

## 2010-01-08 ENCOUNTER — Ambulatory Visit: Payer: Self-pay | Admitting: Oncology

## 2010-01-09 ENCOUNTER — Encounter: Payer: Self-pay | Admitting: Internal Medicine

## 2010-01-09 LAB — CBC WITH DIFFERENTIAL/PLATELET
BASO%: 0.4 % (ref 0.0–2.0)
EOS%: 3.2 % (ref 0.0–7.0)
HCT: 35.2 % — ABNORMAL LOW (ref 38.4–49.9)
LYMPH%: 17.8 % (ref 14.0–49.0)
MCH: 33.9 pg — ABNORMAL HIGH (ref 27.2–33.4)
MCHC: 33 g/dL (ref 32.0–36.0)
MCV: 102.9 fL — ABNORMAL HIGH (ref 79.3–98.0)
MONO%: 14.5 % — ABNORMAL HIGH (ref 0.0–14.0)
NEUT%: 64.1 % (ref 39.0–75.0)
Platelets: 114 10*3/uL — ABNORMAL LOW (ref 140–400)
RBC: 3.42 10*6/uL — ABNORMAL LOW (ref 4.20–5.82)
nRBC: 0 % (ref 0–0)

## 2010-01-09 LAB — TESTOSTERONE: Testosterone: 337.48 ng/dL (ref 250–890)

## 2010-01-09 LAB — COMPREHENSIVE METABOLIC PANEL
Albumin: 4 g/dL (ref 3.5–5.2)
Alkaline Phosphatase: 113 U/L (ref 39–117)
BUN: 14 mg/dL (ref 6–23)
CO2: 23 mEq/L (ref 19–32)
Calcium: 8.7 mg/dL (ref 8.4–10.5)
Chloride: 109 mEq/L (ref 96–112)
Glucose, Bld: 91 mg/dL (ref 70–99)
Sodium: 141 mEq/L (ref 135–145)
Total Bilirubin: 0.5 mg/dL (ref 0.3–1.2)
Total Protein: 6.6 g/dL (ref 6.0–8.3)

## 2010-01-23 ENCOUNTER — Encounter: Payer: Self-pay | Admitting: Internal Medicine

## 2010-01-23 LAB — CBC WITH DIFFERENTIAL/PLATELET
BASO%: 0.6 % (ref 0.0–2.0)
Basophils Absolute: 0 10*3/uL (ref 0.0–0.1)
EOS%: 3.1 % (ref 0.0–7.0)
MCH: 34.3 pg — ABNORMAL HIGH (ref 27.2–33.4)
MCHC: 33.1 g/dL (ref 32.0–36.0)
MCV: 103.5 fL — ABNORMAL HIGH (ref 79.3–98.0)
MONO%: 17.1 % — ABNORMAL HIGH (ref 0.0–14.0)
RBC: 3.44 10*6/uL — ABNORMAL LOW (ref 4.20–5.82)
RDW: 15.1 % — ABNORMAL HIGH (ref 11.0–14.6)
lymph#: 1 10*3/uL (ref 0.9–3.3)

## 2010-01-23 LAB — COMPREHENSIVE METABOLIC PANEL
ALT: 35 U/L (ref 0–53)
AST: 23 U/L (ref 0–37)
Albumin: 3.9 g/dL (ref 3.5–5.2)
Alkaline Phosphatase: 125 U/L — ABNORMAL HIGH (ref 39–117)
BUN: 12 mg/dL (ref 6–23)
Calcium: 8.9 mg/dL (ref 8.4–10.5)
Chloride: 106 mEq/L (ref 96–112)
Potassium: 3.7 mEq/L (ref 3.5–5.3)
Sodium: 139 mEq/L (ref 135–145)
Total Protein: 6.3 g/dL (ref 6.0–8.3)

## 2010-02-06 LAB — COMPREHENSIVE METABOLIC PANEL
AST: 21 U/L (ref 0–37)
Albumin: 4.2 g/dL (ref 3.5–5.2)
Alkaline Phosphatase: 127 U/L — ABNORMAL HIGH (ref 39–117)
Glucose, Bld: 94 mg/dL (ref 70–99)
Potassium: 3.9 mEq/L (ref 3.5–5.3)
Sodium: 141 mEq/L (ref 135–145)
Total Bilirubin: 0.5 mg/dL (ref 0.3–1.2)
Total Protein: 6.2 g/dL (ref 6.0–8.3)

## 2010-02-06 LAB — CBC WITH DIFFERENTIAL/PLATELET
Basophils Absolute: 0.1 10*3/uL (ref 0.0–0.1)
EOS%: 3.6 % (ref 0.0–7.0)
Eosinophils Absolute: 0.2 10*3/uL (ref 0.0–0.5)
HGB: 11.7 g/dL — ABNORMAL LOW (ref 13.0–17.1)
LYMPH%: 19.4 % (ref 14.0–49.0)
MCH: 34.8 pg — ABNORMAL HIGH (ref 27.2–33.4)
MCV: 104.1 fL — ABNORMAL HIGH (ref 79.3–98.0)
MONO%: 12.4 % (ref 0.0–14.0)
NEUT#: 3 10*3/uL (ref 1.5–6.5)
Platelets: 140 10*3/uL (ref 140–400)

## 2010-02-14 ENCOUNTER — Ambulatory Visit (HOSPITAL_BASED_OUTPATIENT_CLINIC_OR_DEPARTMENT_OTHER): Payer: No Typology Code available for payment source | Admitting: Oncology

## 2010-02-20 ENCOUNTER — Encounter: Payer: Self-pay | Admitting: Internal Medicine

## 2010-02-20 LAB — CBC WITH DIFFERENTIAL/PLATELET
HCT: 34.6 % — ABNORMAL LOW (ref 38.4–49.9)
LYMPH%: 22.6 % (ref 14.0–49.0)
MCV: 102.7 fL — ABNORMAL HIGH (ref 79.3–98.0)
MONO#: 0.7 10*3/uL (ref 0.1–0.9)
MONO%: 14 % (ref 0.0–14.0)
NEUT#: 2.9 10*3/uL (ref 1.5–6.5)
Platelets: 110 10*3/uL — ABNORMAL LOW (ref 140–400)

## 2010-02-20 LAB — BASIC METABOLIC PANEL
BUN: 16 mg/dL (ref 6–23)
CO2: 21 mEq/L (ref 19–32)
Calcium: 8.9 mg/dL (ref 8.4–10.5)
Chloride: 109 mEq/L (ref 96–112)
Creatinine, Ser: 0.94 mg/dL (ref 0.40–1.50)
Glucose, Bld: 93 mg/dL (ref 70–99)
Potassium: 4 mEq/L (ref 3.5–5.3)
Sodium: 140 mEq/L (ref 135–145)

## 2010-02-20 LAB — UA PROTEIN, DIPSTICK - CHCC: Protein, Urine: 100 mg/dL

## 2010-03-12 ENCOUNTER — Encounter
Admission: RE | Admit: 2010-03-12 | Discharge: 2010-03-20 | Payer: Self-pay | Source: Home / Self Care | Attending: Oncology | Admitting: Oncology

## 2010-03-13 ENCOUNTER — Ambulatory Visit
Admission: RE | Admit: 2010-03-13 | Discharge: 2010-03-13 | Payer: Self-pay | Source: Home / Self Care | Attending: Internal Medicine | Admitting: Internal Medicine

## 2010-03-13 LAB — CBC WITH DIFFERENTIAL/PLATELET
Eosinophils Absolute: 0.1 10*3/uL (ref 0.0–0.5)
HCT: 34.2 % — ABNORMAL LOW (ref 38.4–49.9)
HGB: 11.4 g/dL — ABNORMAL LOW (ref 13.0–17.1)
LYMPH%: 13.4 % — ABNORMAL LOW (ref 14.0–49.0)
MCV: 102.4 fL — ABNORMAL HIGH (ref 79.3–98.0)
MONO#: 0.9 10*3/uL (ref 0.1–0.9)
NEUT%: 69 % (ref 39.0–75.0)
Platelets: 88 10*3/uL — ABNORMAL LOW (ref 140–400)
RBC: 3.34 10*6/uL — ABNORMAL LOW (ref 4.20–5.82)
RDW: 15.9 % — ABNORMAL HIGH (ref 11.0–14.6)
WBC: 6 10*3/uL (ref 4.0–10.3)

## 2010-03-20 NOTE — Letter (Signed)
[facility]   Imported By: Maryln Gottron 08/18/2009 13:16:40  _____________________________________________________________________  External Attachment:    Type:   Image     Comment:   External Document

## 2010-03-20 NOTE — Progress Notes (Signed)
Summary: REQ FOR RX ON MED (Benicar)    Caller: Patient [phone]  Reason for Call: Talk to Nurse, Talk to Doctor Summary of Call: Pt called to adv that he has been taking Benicar 20 mg qd  ( through the clinical trial that he is presently on at [facility]).... Pt has been taking samples and is adv that he now needs a Rx for the med.... Pt adv that Dr Kirtland Bouchard was aware of him taking this med... Pt would like a Rx sent to [facility] on [address].  Info only---Pt also adv Dr Kirtland Bouchard  that he has started taking a new med:  Levothyroxine.... Has been taking for 7 days so far and it seems to be working well for the pt and relieving his cold feet / hands.  Initial call taken by: Debbra Riding,  May 05, 2009 3:26 PM    New/Updated Medications: BENICAR 20 MG TABS (OLMESARTAN MEDOXOMIL) qd Prescriptions: BENICAR 20 MG TABS (OLMESARTAN MEDOXOMIL) qd  #90 x 4   Entered by:   Duard Brady LPN   Authorized by:   Gordy Savers  MD   Signed by:   Duard Brady LPN on 09/81/1914   Method used:   Electronically to        [facility]. [phone]* (retail)       [address]       [place], [place]  [address]       Ph: [phone]       Fax: [phone]   RxID:   [id number]  Benicar 20 mg  #90 onea day to the daily RF 6

## 2010-03-20 NOTE — Progress Notes (Signed)
Summary: NEED FOR OV?  Phone Note Call from Patient   Caller: Patient [phone] Reason for Call: Talk to Doctor Summary of Call: Pt called in to adv that Dr Kalman Drape (oncologist) office [facility]) will be faxing lab results to Dr Kirtland Bouchard.... Pt states that his TSH level is high and wants to know if Dr Kirtland Bouchard will want him to come in for an OV to discuss same.... Pt also adv that he has concerns about poor circulation because he has tingling in his feet and his feet constantly stay cold.  Pt can be reached at [phone] with any questions or concerns.    Follow-up for Phone Call        patien to come by for some samples Follow-up by: Gordy Savers  MD,  April 13, 2009 12:46 PM    New/Updated Medications: CYMBALTA 60 MG CPEP (DULOXETINE HCL) one daily O.Patient to come by for some samples

## 2010-03-20 NOTE — Letter (Signed)
[facility]   Imported By: Maryln Gottron 04/13/2009 14:57:56  _____________________________________________________________________  External Attachment:    Type:   Image     Comment:   External Document

## 2010-03-20 NOTE — Letter (Signed)
[facility]   Imported By: Maryln Gottron 08/18/2009 15:14:01  _____________________________________________________________________  External Attachment:    Type:   Image     Comment:   External Document

## 2010-03-20 NOTE — Letter (Signed)
[facility]   Imported By: Maryln Gottron 10/16/2009 11:29:24  _____________________________________________________________________  External Attachment:    Type:   Image     Comment:   External Document

## 2010-03-20 NOTE — Letter (Signed)
[facility]   Imported By: Maryln Gottron 07/06/2009 11:03:00  _____________________________________________________________________  External Attachment:    Type:   Image     Comment:   External Document

## 2010-03-20 NOTE — Letter (Signed)
[facility]   Imported By: Maryln Gottron 08/18/2009 15:15:31  _____________________________________________________________________  External Attachment:    Type:   Image     Comment:   External Document

## 2010-03-20 NOTE — Progress Notes (Signed)
Summary: SENT A COPY OF [name]  MED LIST TO JAMIE AT DR. Evlyn Kanner.  Phone Note Call from Patient   Caller: Patient Summary of Call: pt call want a copied of his medication list for synthroid sent to Jamie/Dr. Evlyn Kanner ENDOCRIN Initial call taken by: Drue Stager,  June 05, 2009 11:17 AM

## 2010-03-20 NOTE — Assessment & Plan Note (Signed)
Summary: sinuses?//ccm   Vital Signs:  Patient profile:   50 year old male Weight:      179 pounds Temp:     98.1 degrees F oral BP sitting:   100 / 70  (left arm) Cuff size:   regular  Vitals Entered By: Cay Schillings LPN (September 16, 6759 95:09 AM) CC: c/o (L) side head and sinus congestion, eye drainage in AM Is Patient Diabetic? No   CC:  c/o (L) side head and sinus congestion and eye drainage in AM.  History of Present Illness: 50 year old patient who is receiving every other week.  Chemotherapy for GBM.  The past few weeksand,  he has had some chronic nasal congestion.  He states this is very similar to allergy type symptoms that occurred during both the spring and the fall.  He denies any fever, weakness, localized, sinus or dental pain.  He does have an occasional yellow discharge, but no worsening purulent rhinorrhea.  In general, feels well.  He has treated hypertension.    Allergies (verified): No Known Drug Allergies  Past History:  Past Medical History: Reviewed history from 01/09/2009 and no changes required. glioblastoma multiform.  stage IV Hypertension Allergic rhinitis Seizure disorder  Review of Systems  The patient denies anorexia, fever, weight loss, weight gain, vision loss, decreased hearing, hoarseness, chest pain, syncope, dyspnea on exertion, peripheral edema, prolonged cough, headaches, hemoptysis, abdominal pain, melena, hematochezia, severe indigestion/heartburn, hematuria, incontinence, genital sores, muscle weakness, suspicious skin lesions, transient blindness, difficulty walking, depression, unusual weight change, abnormal bleeding, enlarged lymph nodes, angioedema, breast masses, and testicular masses.    Physical Exam  General:  Well-developed,well-nourished,in no acute distress; alert,appropriate and cooperative throughout examination; blood pressure 100/74 Head:  Normocephalic and atraumatic without obvious abnormalities. No apparent  alopecia or balding. no focal sinus tenderness Eyes:  No corneal or conjunctival inflammation noted. EOMI. Perrla. Funduscopic exam benign, without hemorrhages, exudates or papilledema. Vision grossly normal. Ears:  External ear exam shows no significant lesions or deformities.  Otoscopic examination reveals clear canals, tympanic membranes are intact bilaterally without bulging, retraction, inflammation or discharge. Hearing is grossly normal bilaterally. Mouth:  Oral mucosa and oropharynx without lesions or exudates.  Teeth in good repair. Neck:  No deformities, masses, or tenderness noted. Lungs:  Normal respiratory effort, chest expands symmetrically. Lungs are clear to auscultation, no crackles or wheezes. Heart:  Normal rate and regular rhythm. S1 and S2 normal without gallop, murmur, click, rub or other extra sounds.   Impression & Recommendations:  Problem # 1:  ALLERGIC RHINITIS (ICD-477.9) no evidence for acute bacterial sinusitis at this time.  Duration of the symptoms and ongoing chemotherapy somewhat bothersome but will observe at this time and treat symptomatically.  Will resume Zyrtec and add nasonex;    Problem # 2:  HYPERTENSION (ICD-401.9)  The following medications were removed from the medication list:    Azor 10-20 Mg Tabs (Amlodipine-olmesartan) ..... One daily    Benazepril Hcl 20 Mg Tabs (Benazepril hcl) ..... One daily His updated medication list for this problem includes:    Amlodipine Besylate 10 Mg Tabs (Amlodipine besylate) .Marland Kitchen... 1 once daily    Benicar 20 Mg Tabs (Olmesartan medoxomil) ..... Qd  Complete Medication List: 1)  Amlodipine Besylate 10 Mg Tabs (Amlodipine besylate) .Marland Kitchen.. 1 once daily 2)  Levetiracetam 500 Mg Tabs (Levetiracetam) .Marland Kitchen.. 1 once daily 3)  Klor-con 10 10 Meq Cr-tabs (Potassium chloride) .Marland Kitchen.. 1 once daily 4)  Hydrocortisone 10 Mg Tabs (Hydrocortisone) .Marland KitchenMarland KitchenMarland Kitchen 1  once daily 5)  Senokot S 8.6-50 Mg Tabs (Sennosides-docusate sodium) .Marland Kitchen.. 1 once  daily as needed 6)  Avastin 100 Mg/85ml Soln (Bevacizumab) .... Every other tuesday 7)  Benicar 20 Mg Tabs (Olmesartan medoxomil) .... Qd 8)  Synthroid 75 Mcg Tabs (Levothyroxine sodium) .... One daily 9)  Pantoprazole Sodium 40 Mg Tbec (Pantoprazole sodium) .... Qd  Patient Instructions: 1)  call if  you  developed fever, or localized sinus pain or any worsening nasal discharge 2)  Check your Blood Pressure regularly. If it is above: 150/90 you should make an appointment.

## 2010-03-20 NOTE — Letter (Signed)
Summary: [facility]  [facility]   Imported By: Maryln Gottron 02/22/2009 09:53:03  _____________________________________________________________________  External Attachment:    Type:   Image     Comment:   External Document

## 2010-03-20 NOTE — Letter (Signed)
Summary: [facility]  [facility]   Imported By: Maryln Gottron 02/27/2009 12:51:35  _____________________________________________________________________  External Attachment:    Type:   Image     Comment:   External Document

## 2010-03-20 NOTE — Letter (Signed)
Summary: [facility]  [facility]   Imported By: Maryln Gottron 11/13/2009 13:56:00  _____________________________________________________________________  External Attachment:    Type:   Image     Comment:   External Document

## 2010-03-20 NOTE — Letter (Signed)
Summary: [facility]  [facility]   Imported By: Maryln Gottron 12/07/2009 14:06:16  _____________________________________________________________________  External Attachment:    Type:   Image     Comment:   External Document

## 2010-03-20 NOTE — Assessment & Plan Note (Signed)
Summary: ROA/FUP/RCD   Vital Signs:  Patient profile:   50 year old male Weight:      179 pounds BMI:     27.32 BP sitting:   110 / 66  (left arm) Cuff size:   regular  Vitals Entered By: Chipper Oman, RN (March 09, 2009 2:00 PM) CC: F/u BP.   CC:  F/u BP.Marland Kitchen  History of Present Illness: 50 year old patient who is in for follow up of his hypertension.  He is doing quite well on his present regimen, which he tolerates well.  Home blood pressure readings have been under nice control.  No concerns or complaints.  He is followed closely and treated aggressively for glioblastoma multiforme.  Allergies: No Known Drug Allergies  Past History:  Past Medical History: Reviewed history from 01/09/2009 and no changes required. glioblastoma multiform.  stage IV Hypertension Allergic rhinitis Seizure disorder  Physical Exam  General:  Well-developed,well-nourished,in no acute distress; alert,appropriate and cooperative throughout examination; 120/80   Impression & Recommendations:  Problem # 1:  HYPERTENSION (ICD-401.9)  His updated medication list for this problem includes:    Amlodipine Besylate 10 Mg Tabs (Amlodipine besylate) .Marland Kitchen... 1 once daily    Benicar 20 Mg Tabs (Olmesartan medoxomil) ..... One daily    Azor 10-20 Mg Tabs (Amlodipine-olmesartan) ..... One daily    Benazepril Hcl 20 Mg Tabs (Benazepril hcl) ..... One daily  His updated medication list for this problem includes:    Amlodipine Besylate 10 Mg Tabs (Amlodipine besylate) .Marland Kitchen... 1 once daily    Benicar 20 Mg Tabs (Olmesartan medoxomil) ..... One daily    Azor 10-20 Mg Tabs (Amlodipine-olmesartan) ..... One daily    Benazepril Hcl 20 Mg Tabs (Benazepril hcl) ..... One daily  Complete Medication List: 1)  Amlodipine Besylate 10 Mg Tabs (Amlodipine besylate) .Marland Kitchen.. 1 once daily 2)  Levetiracetam 500 Mg Tabs (Levetiracetam) .Marland Kitchen.. 1 once daily 3)  Klor-con 10 10 Meq Cr-tabs (Potassium chloride) .Marland Kitchen.. 1 once  daily 4)  Hydrocortisone 10 Mg Tabs (Hydrocortisone) .Marland Kitchen.. 1 once daily 5)  Senokot S 8.6-50 Mg Tabs (Sennosides-docusate sodium) .Marland Kitchen.. 1 once daily as needed 6)  Benicar 20 Mg Tabs (Olmesartan medoxomil) .... One daily 7)  Avastin 100 Mg/38ml Soln (Bevacizumab) .... Every other tuesday 8)  Zolinza 100 Mg Caps (Vorinostat) .... 400 mg at dinner time-7 days on, then 7 days off 9)  Temodar 100 Mg Caps (Temozolomide) .... One at bedtime 10)  Azor 10-20 Mg Tabs (Amlodipine-olmesartan) .... One daily 11)  Benazepril Hcl 20 Mg Tabs (Benazepril hcl) .... One daily  Patient Instructions: 1)  Please schedule a follow-up appointment in 4 months. 2)  Limit your Sodium (Salt). 3)  It is important that you exercise regularly at least 20 minutes 5 times a week. If you develop chest pain, have severe difficulty breathing, or feel very tired , stop exercising immediately and seek medical attention. Prescriptions: BENAZEPRIL HCL 20 MG TABS (BENAZEPRIL HCL) one daily  #90 x 6   Entered and Authorized by:   Marletta Lor  MD   Signed by:   Marletta Lor  MD on 03/09/2009   Method used:   Print then Give to Patient   RxID:   [id number] AZOR 10-20 MG TABS (AMLODIPINE-OLMESARTAN) one daily  #90 x 6   Entered and Authorized by:   Marletta Lor  MD   Signed by:   Marletta Lor  MD on 03/09/2009   Method used:   Print then  Give to Patient   RxID:   [id number] BENICAR 20 MG TABS (OLMESARTAN MEDOXOMIL) one daily  #90 x 6   Entered and Authorized by:   Marletta Lor  MD   Signed by:   Marletta Lor  MD on 03/09/2009   Method used:   Print then Give to Patient   RxID:   [id number] AMLODIPINE BESYLATE 10 MG TABS (AMLODIPINE BESYLATE) 1 once daily  #90 x 6   Entered and Authorized by:   Marletta Lor  MD   Signed by:   Marletta Lor  MD on 03/09/2009   Method used:   Print then Give to Patient   RxID:   [id number]

## 2010-03-20 NOTE — Letter (Signed)
[facility]   Imported By: Maryln Gottron 05/11/2009 15:22:05  _____________________________________________________________________  External Attachment:    Type:   Image     Comment:   External Document

## 2010-03-20 NOTE — Letter (Signed)
[facility]   Imported By: Maryln Gottron 03/17/2009 11:21:34  _____________________________________________________________________  External Attachment:    Type:   Image     Comment:   External Document

## 2010-03-20 NOTE — Letter (Signed)
[facility]   Imported By: Maryln Gottron 07/18/2009 10:48:38  _____________________________________________________________________  External Attachment:    Type:   Image     Comment:   External Document

## 2010-03-20 NOTE — Letter (Signed)
[facility]   Imported By: Maryln Gottron 07/18/2009 10:53:41  _____________________________________________________________________  External Attachment:    Type:   Image     Comment:   External Document

## 2010-03-20 NOTE — Letter (Signed)
Summary: [facility]  [facility]   Imported By: Sherian Rein 01/02/2010 12:49:38  _____________________________________________________________________  External Attachment:    Type:   Image     Comment:   External Document

## 2010-03-20 NOTE — Progress Notes (Signed)
Summary: INCREASE IN DOSAGE - RX REQUESTED  Phone Note Refill Request Call back at [phone]  (cell) Message from:  Patient on August 11, 2009 11:04 AM  Refills Requested: Medication #1:  SYNTHROID 50 MCG TABS qd   Notes: [facility] [address] [facility] [address].... Pt adv that his Endocrinologist (Dr. Evlyn Kanner) said the dosage for this med needs to increase from 50mg  to 75ng..... Pt can be reached at [phone] with any questions or concerns...Marland KitchenMarland KitchenSpoke with Carollee Herter, at Dr Vella Redhead office [facility] and she adv that they have the pts med (Synthroid) at 75mg  q.d.    Initial call taken by: Debbra Riding,  August 11, 2009 11:07 AM  Follow-up for Phone Call        continue synthroid 75-  OK to call in new Rx if needed Follow-up by: Gordy Savers  MD,  August 13, 2009 8:26 PM    New/Updated Medications: SYNTHROID 75 MCG TABS (LEVOTHYROXINE SODIUM) one daily Prescriptions: SYNTHROID 75 MCG TABS (LEVOTHYROXINE SODIUM) one daily  #90 x 2   Entered by:   Duard Brady LPN   Authorized by:   Gordy Savers  MD   Signed by:   Duard Brady LPN on 84/69/6295   Method used:   Electronically to        [facility]. [phone]* (retail)       [address]       [place], [place]  [address]       Ph: [phone]       Fax: [phone]   RxID:   [id number]

## 2010-03-20 NOTE — Letter (Signed)
[facility]   Imported By: Maryln Gottron 07/03/2009 10:18:58  _____________________________________________________________________  External Attachment:    Type:   Image     Comment:   External Document

## 2010-03-20 NOTE — Letter (Signed)
[facility]   Imported By: Maryln Gottron 09/21/2009 09:02:55  _____________________________________________________________________  External Attachment:    Type:   Image     Comment:   External Document

## 2010-03-20 NOTE — Letter (Signed)
[facility]   [facility]   Imported By: Maryln Gottron 01/17/2010 09:17:27  _____________________________________________________________________  External Attachment:    Type:   Image     Comment:   External Document

## 2010-03-20 NOTE — Letter (Signed)
Summary: [facility]  [facility]   Imported By: Maryln Gottron 10/31/2009 14:23:51  _____________________________________________________________________  External Attachment:    Type:   Image     Comment:   External Document

## 2010-03-21 ENCOUNTER — Other Ambulatory Visit: Payer: Self-pay | Admitting: Oncology

## 2010-03-21 ENCOUNTER — Ambulatory Visit: Payer: Self-pay | Admitting: Physical Therapy

## 2010-03-21 ENCOUNTER — Ambulatory Visit (HOSPITAL_COMMUNITY)
Admission: RE | Admit: 2010-03-21 | Discharge: 2010-03-21 | Disposition: A | Discharge status: Home / Self Care | Payer: No Typology Code available for payment source | Source: Ambulatory Visit | Attending: Oncology | Admitting: Oncology

## 2010-03-21 ENCOUNTER — Encounter (HOSPITAL_BASED_OUTPATIENT_CLINIC_OR_DEPARTMENT_OTHER): Payer: No Typology Code available for payment source | Admitting: Oncology

## 2010-03-21 ENCOUNTER — Encounter: Payer: Self-pay | Admitting: Occupational Therapy

## 2010-03-21 ENCOUNTER — Encounter (HOSPITAL_COMMUNITY): Payer: Self-pay

## 2010-03-21 DIAGNOSIS — G9389 Other specified disorders of brain: Secondary | ICD-10-CM | POA: Insufficient documentation

## 2010-03-21 DIAGNOSIS — C712 Malignant neoplasm of temporal lobe: Secondary | ICD-10-CM

## 2010-03-21 DIAGNOSIS — R531 Weakness: Secondary | ICD-10-CM

## 2010-03-21 DIAGNOSIS — Z5112 Encounter for antineoplastic immunotherapy: Secondary | ICD-10-CM

## 2010-03-21 DIAGNOSIS — R29898 Other symptoms and signs involving the musculoskeletal system: Secondary | ICD-10-CM | POA: Insufficient documentation

## 2010-03-21 LAB — CBC WITH DIFFERENTIAL/PLATELET
Basophils Absolute: 0 10*3/uL (ref 0.0–0.1)
EOS%: 2.3 % (ref 0.0–7.0)
Eosinophils Absolute: 0.1 10*3/uL (ref 0.0–0.5)
HCT: 36.2 % — ABNORMAL LOW (ref 38.4–49.9)
LYMPH%: 14.5 % (ref 14.0–49.0)
MONO%: 15.9 % — ABNORMAL HIGH (ref 0.0–14.0)
Platelets: 78 10*3/uL — ABNORMAL LOW (ref 140–400)
RBC: 3.55 10*6/uL — ABNORMAL LOW (ref 4.20–5.82)
RDW: 15.6 % — ABNORMAL HIGH (ref 11.0–14.6)
WBC: 6.3 10*3/uL (ref 4.0–10.3)
lymph#: 0.9 10*3/uL (ref 0.9–3.3)

## 2010-03-21 LAB — URINALYSIS, MICROSCOPIC - CHCC
Bilirubin (Urine): NEGATIVE
Glucose: NEGATIVE g/dL
Leukocyte Esterase: NEGATIVE
Protein: 300 mg/dL
Specific Gravity, Urine: 1.03 (ref 1.003–1.035)
pH: 6 (ref 4.6–8.0)

## 2010-03-21 LAB — COMPREHENSIVE METABOLIC PANEL
ALT: 28 U/L (ref 0–53)
Alkaline Phosphatase: 104 U/L (ref 39–117)
BUN: 16 mg/dL (ref 6–23)
CO2: 23 mEq/L (ref 19–32)
Chloride: 109 mEq/L (ref 96–112)
Creatinine, Ser: 1.14 mg/dL (ref 0.40–1.50)
Glucose, Bld: 93 mg/dL (ref 70–99)
Potassium: 3.8 mEq/L (ref 3.5–5.3)
Total Bilirubin: 0.9 mg/dL (ref 0.3–1.2)

## 2010-03-21 LAB — LIPID PANEL: HDL: 42 mg/dL (ref >39)

## 2010-03-22 NOTE — Letter (Signed)
Summary: [facility]  [facility]   Imported By: Maryln Gottron 01/30/2010 10:38:17  _____________________________________________________________________  External Attachment:    Type:   Image     Comment:   External Document

## 2010-03-22 NOTE — Letter (Signed)
Summary: [facility]  [facility]   Imported By: Maryln Gottron 03/08/2010 11:22:20  _____________________________________________________________________  External Attachment:    Type:   Image     Comment:   External Document

## 2010-03-22 NOTE — Assessment & Plan Note (Signed)
Summary: fup on bp med/cjr   Vital Signs:  Patient profile:   50 year old male Weight:      186 pounds Temp:     98.0 degrees F oral BP sitting:   120 / 80  (right arm) Cuff size:   regular  Vitals Entered By: Duard Brady LPN (March 13, 2010 10:36 AM) CC: blood pressure concerns - up and down Is Patient Diabetic? No   CC:  blood pressure concerns - up and down.  History of Present Illness: 50 year old patient who is in today for follow-up of his hypertension.  He has started physical therapy and has noted to have some exaggerated blood pressure responses to exercise.  Oncology notes reviewed and blood pressure is occasionally elevated.  He generally feels well.  He states that a few weeks ago.  He had a PET scan that was free of neoplasm  Allergies (verified): No Known Drug Allergies  Past History:  Past Medical History: Reviewed history from 01/09/2009 and no changes required. glioblastoma multiform.  stage IV Hypertension Allergic rhinitis Seizure disorder  Family History: Reviewed history from 01/09/2009 and no changes required. father died age 72 myelodysplastic syndrome mother age 15 in good health  Social History: Reviewed history from 01/09/2009 and no changes required. Occupation:  JD and business Married  Review of Systems       The patient complains of muscle weakness.  The patient denies anorexia, fever, weight loss, weight gain, vision loss, decreased hearing, hoarseness, chest pain, syncope, dyspnea on exertion, peripheral edema, prolonged cough, headaches, hemoptysis, abdominal pain, melena, hematochezia, severe indigestion/heartburn, hematuria, incontinence, genital sores, suspicious skin lesions, transient blindness, difficulty walking, depression, unusual weight change, abnormal bleeding, enlarged lymph nodes, angioedema, breast masses, and testicular masses.         right hemi-anopsia   Physical Exam  General:  blood pressure 120/80 on  arrival as high as 160 over  100 later in the exam Head:  Normocephalic and atraumatic without obvious abnormalities. No apparent alopecia or balding. Eyes:  right visual field defect Mouth:  Oral mucosa and oropharynx without lesions or exudates.  Teeth in good repair. Neck:  No deformities, masses, or tenderness noted. Lungs:  Normal respiratory effort, chest expands symmetrically. Lungs are clear to auscultation, no crackles or wheezes. Heart:  Normal rate and regular rhythm. S1 and S2 normal without gallop, murmur, click, rub or other extra sounds.   Impression & Recommendations:  Problem # 1:  HYPERTENSION (ICD-401.9)  The following medications were removed from the medication list:    Benicar 20 Mg Tabs (Olmesartan medoxomil) ..... Qd His updated medication list for this problem includes:    Amlodipine Besylate 10 Mg Tabs (Amlodipine besylate) .Marland Kitchen... 1 once daily    Benicar 40 Mg Tabs (Olmesartan medoxomil) ..... One daily  The following medications were removed from the medication list:    Benicar 20 Mg Tabs (Olmesartan medoxomil) ..... Qd His updated medication list for this problem includes:    Amlodipine Besylate 10 Mg Tabs (Amlodipine besylate) .Marland Kitchen... 1 once daily    Benicar 40 Mg Tabs (Olmesartan medoxomil) ..... One daily  Problem # 2:  SEIZURE DISORDER (ICD-780.39)  His updated medication list for this problem includes:    Levetiracetam 500 Mg Tabs (Levetiracetam) .Marland Kitchen... 1 once daily  His updated medication list for this problem includes:    Levetiracetam 500 Mg Tabs (Levetiracetam) .Marland Kitchen... 1 once daily  Complete Medication List: 1)  Amlodipine Besylate 10 Mg Tabs (Amlodipine besylate) .Marland KitchenMarland KitchenMarland Kitchen  1 once daily 2)  Levetiracetam 500 Mg Tabs (Levetiracetam) .Marland Kitchen.. 1 once daily 3)  Klor-con 10 10 Meq Cr-tabs (Potassium chloride) .Marland Kitchen.. 1 once daily 4)  Hydrocortisone 10 Mg Tabs (Hydrocortisone) .Marland Kitchen.. 1 once daily 5)  Senokot S 8.6-50 Mg Tabs (Sennosides-docusate sodium) .Marland Kitchen.. 1 once  daily as needed 6)  Avastin 100 Mg/17ml Soln (Bevacizumab) .... Every other tuesday 7)  Synthroid 75 Mcg Tabs (Levothyroxine sodium) .... One daily 8)  Pantoprazole Sodium 40 Mg Tbec (Pantoprazole sodium) .... Qd 9)  Zoloft Tabs (sertraline Hcl)  .... Qd 10)  Benicar 40 Mg Tabs (Olmesartan medoxomil) .... One daily  Patient Instructions: 1)  Please schedule a follow-up appointment in 3 months. 2)  Limit your Sodium (Salt). 3)  It is important that you exercise regularly at least 20 minutes 5 times a week. If you develop chest pain, have severe difficulty breathing, or feel very tired , stop exercising immediately and seek medical attention. 4)  Check your Blood Pressure regularly. If it is above:  150/90 you should make an appointment. Prescriptions: BENICAR 40 MG TABS (OLMESARTAN MEDOXOMIL) one daily  #90 x 6   Entered and Authorized by:   Gordy Savers  MD   Signed by:   Gordy Savers  MD on 03/13/2010   Method used:   Electronically to        [facility]. [phone]* (retail)       [address]       [place], [place]  [address]       Ph: [phone]       Fax: [phone]   RxID:   [id number] SYNTHROID 75 MCG TABS (LEVOTHYROXINE SODIUM) one daily  #90 x 2   Entered and Authorized by:   Gordy Savers  MD   Signed by:   Gordy Savers  MD on 03/13/2010   Method used:   Electronically to        [facility]. [phone]* (retail)       [address]       [place], [place]  [address]       Ph: [phone]       Fax: [phone]   RxID:   [id number] AMLODIPINE BESYLATE 10 MG TABS (AMLODIPINE BESYLATE) 1 once daily  #90 x 6   Entered and Authorized by:   Gordy Savers  MD   Signed by:   Gordy Savers  MD on 03/13/2010   Method used:   Electronically to        [facility]. [phone]* (retail)       [address]       [place], [place]  [address]       Ph: [phone]       Fax: [phone]   RxID:   [id number]    Orders Added: 1)  Est. Patient Level III [94[address]]

## 2010-03-23 ENCOUNTER — Encounter (HOSPITAL_BASED_OUTPATIENT_CLINIC_OR_DEPARTMENT_OTHER): Payer: No Typology Code available for payment source | Admitting: Oncology

## 2010-03-23 ENCOUNTER — Ambulatory Visit: Payer: Self-pay | Admitting: Physical Therapy

## 2010-03-23 DIAGNOSIS — C712 Malignant neoplasm of temporal lobe: Secondary | ICD-10-CM

## 2010-03-24 LAB — PROTEIN, URINE, 24 HOUR
Protein, 24H Urine: 3760 mg/d — ABNORMAL HIGH (ref 50–100)
Urine Total Volume-UPROT: 2000 mL

## 2010-03-26 ENCOUNTER — Encounter: Payer: Self-pay | Admitting: Occupational Therapy

## 2010-03-26 ENCOUNTER — Ambulatory Visit: Payer: Self-pay | Admitting: Physical Therapy

## 2010-03-27 ENCOUNTER — Encounter: Payer: No Typology Code available for payment source | Admitting: Oncology

## 2010-03-27 ENCOUNTER — Inpatient Hospital Stay (HOSPITAL_COMMUNITY): Payer: No Typology Code available for payment source

## 2010-03-27 ENCOUNTER — Inpatient Hospital Stay (HOSPITAL_COMMUNITY)
Admission: AD | Admit: 2010-03-27 | Discharge: 2010-03-30 | DRG: 699 | Disposition: A | Discharge status: Home / Self Care | Payer: No Typology Code available for payment source | Source: Ambulatory Visit | Attending: Oncology | Admitting: Oncology

## 2010-03-27 DIAGNOSIS — R4701 Aphasia: Secondary | ICD-10-CM | POA: Diagnosis present

## 2010-03-27 DIAGNOSIS — R509 Fever, unspecified: Secondary | ICD-10-CM

## 2010-03-27 DIAGNOSIS — I10 Essential (primary) hypertension: Secondary | ICD-10-CM | POA: Diagnosis present

## 2010-03-27 DIAGNOSIS — N049 Nephrotic syndrome with unspecified morphologic changes: Principal | ICD-10-CM | POA: Diagnosis present

## 2010-03-27 DIAGNOSIS — D6959 Other secondary thrombocytopenia: Secondary | ICD-10-CM | POA: Diagnosis present

## 2010-03-27 DIAGNOSIS — Z923 Personal history of irradiation: Secondary | ICD-10-CM

## 2010-03-27 DIAGNOSIS — F29 Unspecified psychosis not due to a substance or known physiological condition: Secondary | ICD-10-CM | POA: Diagnosis present

## 2010-03-27 DIAGNOSIS — H5316 Psychophysical visual disturbances: Secondary | ICD-10-CM | POA: Diagnosis not present

## 2010-03-27 DIAGNOSIS — R29898 Other symptoms and signs involving the musculoskeletal system: Secondary | ICD-10-CM | POA: Diagnosis present

## 2010-03-27 DIAGNOSIS — R51 Headache: Secondary | ICD-10-CM | POA: Diagnosis not present

## 2010-03-27 DIAGNOSIS — E039 Hypothyroidism, unspecified: Secondary | ICD-10-CM | POA: Diagnosis present

## 2010-03-27 DIAGNOSIS — R339 Retention of urine, unspecified: Secondary | ICD-10-CM | POA: Diagnosis not present

## 2010-03-27 DIAGNOSIS — C719 Malignant neoplasm of brain, unspecified: Secondary | ICD-10-CM

## 2010-03-27 DIAGNOSIS — T451X5A Adverse effect of antineoplastic and immunosuppressive drugs, initial encounter: Secondary | ICD-10-CM | POA: Diagnosis present

## 2010-03-27 DIAGNOSIS — G40909 Epilepsy, unspecified, not intractable, without status epilepticus: Secondary | ICD-10-CM | POA: Diagnosis present

## 2010-03-27 DIAGNOSIS — C712 Malignant neoplasm of temporal lobe: Secondary | ICD-10-CM | POA: Diagnosis present

## 2010-03-27 LAB — DIFFERENTIAL
Eosinophils Relative: 0 % (ref 0–5)
Lymphs Abs: 0.9 10*3/uL (ref 0.7–4.0)
Monocytes Absolute: 2.2 10*3/uL — ABNORMAL HIGH (ref 0.1–1.0)
Neutro Abs: 11.8 10*3/uL — ABNORMAL HIGH (ref 1.7–7.7)

## 2010-03-27 LAB — DIC (DISSEMINATED INTRAVASCULAR COAGULATION)PANEL
Fibrinogen: 437 mg/dL (ref 204–475)
INR: 1.09 (ref 0.00–1.49)
Smear Review: NONE SEEN
aPTT: 23 seconds — ABNORMAL LOW (ref 24–37)

## 2010-03-27 LAB — COMPREHENSIVE METABOLIC PANEL
Albumin: 3.2 g/dL — ABNORMAL LOW (ref 3.5–5.2)
BUN: 16 mg/dL (ref 6–23)
CO2: 26 mEq/L (ref 19–32)
Calcium: 8.3 mg/dL — ABNORMAL LOW (ref 8.4–10.5)
Chloride: 104 mEq/L (ref 96–112)
Creatinine, Ser: 1.15 mg/dL (ref 0.4–1.5)
GFR calc non Af Amer: >60 mL/min (ref >60)
Total Bilirubin: 0.9 mg/dL (ref 0.3–1.2)

## 2010-03-27 LAB — CBC
Hemoglobin: 11.9 g/dL — ABNORMAL LOW (ref 13.0–17.0)
MCH: 33.6 pg (ref 26.0–34.0)
MCHC: 33.4 g/dL (ref 30.0–36.0)
MCV: 100.6 fL — ABNORMAL HIGH (ref 78.0–100.0)

## 2010-03-27 MED ORDER — GADOBENATE DIMEGLUMINE 529 MG/ML IV SOLN: 20.0000 mL | Freq: Once | INTRAVENOUS | Status: AC | PRN

## 2010-03-28 ENCOUNTER — Inpatient Hospital Stay (HOSPITAL_COMMUNITY)
Admit: 2010-03-28 | Discharge: 2010-03-28 | Disposition: A | Discharge status: Home / Self Care | Payer: No Typology Code available for payment source | Attending: Neurology | Admitting: Neurology

## 2010-03-28 ENCOUNTER — Inpatient Hospital Stay (HOSPITAL_COMMUNITY): Payer: No Typology Code available for payment source

## 2010-03-28 ENCOUNTER — Ambulatory Visit: Payer: Self-pay | Admitting: Physical Therapy

## 2010-03-28 ENCOUNTER — Encounter: Payer: Self-pay | Admitting: Occupational Therapy

## 2010-03-28 DIAGNOSIS — R609 Edema, unspecified: Secondary | ICD-10-CM

## 2010-03-28 LAB — URINALYSIS, ROUTINE W REFLEX MICROSCOPIC
Nitrite: NEGATIVE
Specific Gravity, Urine: 1.023 (ref 1.005–1.030)
Urobilinogen, UA: 1 mg/dL (ref 0.0–1.0)

## 2010-03-28 LAB — URINE MICROSCOPIC-ADD ON

## 2010-03-29 LAB — URINE CULTURE
Culture: NO GROWTH
Culture: NO GROWTH
Special Requests: NEGATIVE

## 2010-03-30 LAB — CBC
HCT: 28.6 % — ABNORMAL LOW (ref 39.0–52.0)
RDW: 13.7 % (ref 11.5–15.5)
WBC: 13.2 10*3/uL — ABNORMAL HIGH (ref 4.0–10.5)

## 2010-03-30 LAB — DIFFERENTIAL
Basophils Absolute: 0 10*3/uL (ref 0.0–0.1)
Eosinophils Relative: 0 % (ref 0–5)
Lymphocytes Relative: 5 % — ABNORMAL LOW (ref 12–46)
Neutro Abs: 11.8 10*3/uL — ABNORMAL HIGH (ref 1.7–7.7)

## 2010-04-02 ENCOUNTER — Ambulatory Visit: Payer: Self-pay | Admitting: Physical Therapy

## 2010-04-02 ENCOUNTER — Encounter: Payer: Self-pay | Admitting: Occupational Therapy

## 2010-04-03 ENCOUNTER — Other Ambulatory Visit: Payer: Self-pay | Admitting: Oncology

## 2010-04-03 ENCOUNTER — Encounter (HOSPITAL_BASED_OUTPATIENT_CLINIC_OR_DEPARTMENT_OTHER): Payer: No Typology Code available for payment source | Admitting: Oncology

## 2010-04-03 DIAGNOSIS — Z5112 Encounter for antineoplastic immunotherapy: Secondary | ICD-10-CM

## 2010-04-03 DIAGNOSIS — Z5111 Encounter for antineoplastic chemotherapy: Secondary | ICD-10-CM

## 2010-04-03 DIAGNOSIS — C712 Malignant neoplasm of temporal lobe: Secondary | ICD-10-CM

## 2010-04-03 DIAGNOSIS — Z5189 Encounter for other specified aftercare: Secondary | ICD-10-CM

## 2010-04-03 LAB — CBC WITH DIFFERENTIAL/PLATELET
Eosinophils Absolute: 0 10*3/uL (ref 0.0–0.5)
LYMPH%: 5.3 % — ABNORMAL LOW (ref 14.0–49.0)
MONO#: 0.6 10*3/uL (ref 0.1–0.9)
NEUT#: 8.3 10*3/uL — ABNORMAL HIGH (ref 1.5–6.5)
Platelets: 115 10*3/uL — ABNORMAL LOW (ref 140–400)
RBC: 3.29 10*6/uL — ABNORMAL LOW (ref 4.20–5.82)
RDW: 14.8 % — ABNORMAL HIGH (ref 11.0–14.6)
WBC: 9.4 10*3/uL (ref 4.0–10.3)

## 2010-04-03 LAB — CULTURE, BLOOD (SINGLE): Culture  Setup Time: 201202080038

## 2010-04-03 NOTE — Consult Note (Signed)
[name], [name]                ACCOUNT NO.:  [id number]  MEDICAL RECORD NO.:  [id number]           PATIENT TYPE:  I  LOCATION:  9983                         FACILITY:  [facility]  PHYSICIAN:  Jill Alexanders, M.D.  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  This was diagnosed initially in 2009 after a seizure event.  This patient underwent decompression of the tumor in October 2009, and was then sent for chemotherapy and radiation.  This patient received Temodar, carboplatin, and Avastin.  This patient did not respond to the chemotherapy well and was eventually placed on monotherapy with Avastin.  A recent study done in the end of December 2011 revealed a good response of the tumor.  The patient, however, over the last 2-3 weeks has noted an increasing frequency of left-sided headaches, right-sided weakness.  The patient began running some fevers 2 days ago.  Overnight, on the day of admission, the patient was noted to have increasing right hemiparesis and new onset of aphasia.  The patient has a chronic right homonymous visual field deficit.  The patient is being brought into the hospital for further evaluation.  The patient had to come off the Avastin within the last week or so due to onset of a nephrotic syndrome associated with the drug.  PAST MEDICAL HISTORY:  Significant for, 1. 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 2. Nephrotic syndrome associated with Avastin. 3. Right hemiparesis, headache, aphasia, new onset. 4. Hypothyroidism. 5. Hypertension. 6. Seizure event.  MEDICATIONS:  The patient is currently on Lasix 20 mg twice daily, clonidine 0.1 mg  twice daily, potassium 20 mEq twice daily, Protonix 40 mEq daily, Benicar 40 mg daily, Norvasc 10 mg daily, Keppra 500 mg twice daily, Synthroid 0.075 mg daily, Zoloft 50 mg daily.  SOCIAL HISTORY:  This patient is married, lives in the [place], [place] area and has 2 children who are alive and well, in high school.  The patient is on disability.  FAMILY HISTORY:  The mother is living and is in good health.  Father died with myelodysplasia.  The patient has no brothers or sisters and is an only child.  REVIEW OF SYSTEMS:  Notable for no significant problems or shortness of breath.  The patient has had recent fevers, headaches on the left side that become quite severe, new-onset aphasia, chronic right homonymous visual field changes, and the patient does report some dizziness at times.  Denies any blackout episodes or recent seizures.  The patient has reported some increase in the right-sided weakness.  No definite numbness on the right side.  The patient has been dragging his right leg with walking.  Denies chest pains, abdominal pain and has had nausea and vomiting today.  Denies problems controlling the bowels or the bladder.  PHYSICAL EXAMINATION:  VITALS:  Blood  pressure is currently 154/99, heart rate 81, respiratory rate 20, temperature 99.2. GENERAL:  The patient is a minimally obese, white male who is alert and cooperative at the time examination. HEENT:  Head is atraumatic.  Eyes, pupils are equal, round, react to light. NECK:  Supple.  No carotid bruits noted. RESPIRATORY:  Examination is clear. CARDIOVASCULAR:  Regular rate and rhythm.  No obvious murmurs or rubs noted. EXTREMITIES:  Notable for trace edema at the ankles bilaterally. NEUROLOGIC EXAMINATION:  Cranial nerves as above.  Facial symmetry is not present with a minimal depression of the right nasolabial fold. Extraocular are full.  Visual fields are notable for a dense right homonymous visual field  deficit.  Speech is well-enunciated, but clearly is aphasic with significant word-finding issues and dysnomia.  The patient has some difficulty with understanding language as well.  Motor testing reveals an increased tone in the right arm and right leg.  On the left, the patient has difficulty with extension of the fingers and wrist on the right, can grip and flex and extend elbow fairly well. Minimal drift is seen with the right arm.  The patient has minimal drift with the right leg.  No drift seen on the left side of the body.  The patient has fairly good strength of the right leg.  Left side has normal motor strength.  The patient is able to perform finger-nose-finger and heel-to-shin with the left side, but has significant apraxia at times. The patient is unable to perform finger-nose-finger with the right arm. Can perform toe-to-finger with the right leg.  The patient was not ambulated.  Deep tendon reflexes reveal a slight increase in the reflexes in the right arm.  Right leg is spread left.  Toes are neutral bilaterally.  The patient claims to have good sensation to pinprick and vibration sensation throughout.  LABORATORY DATA:  Laboratory values are pending at this time.  MRI of the brain is pending.  IMPRESSION: 1. New onset of right hemiparesis and aphasia and increased left-sided     headache. 2. Febrile illness. 3. Glioblastoma multiforme, left brain.  This patient has apparently been documented to have a good response to chemotherapy with brain tumor in December 2011.  The patient, however, has developed a right hemiparesis and aphasia syndrome.  This has come on gradually over the last 2 weeks with significant worsening overnight. Given the use of Avastin, do need to exclude the possibility of an ischemic stroke event or an intracranial hemorrhage, although CT of the head done 1 week ago was unremarkable.  Need to rule out the possibility of evolving radiation  necrosis.  I would doubt recurrence of tumor.  We will follow the patient's clinical course while in-house.  The patient will be set up for the MRI of the brain and EEG study.  Admission blood work is pending.     Jill Alexanders, M.D.     CKW/MEDQ  D:  03/27/2010  T:  03/27/2010  Job:  [id number]  cc:   Izola Price. Benay Spice, M.D. Fax: [facility] [address], [place], [place] [address]  Electronically Signed by Roxy Cedar M.D. on 04/03/2010 09:58:15 AM

## 2010-04-04 ENCOUNTER — Ambulatory Visit: Payer: Self-pay | Admitting: Physical Therapy

## 2010-04-04 ENCOUNTER — Encounter: Payer: Self-pay | Admitting: Occupational Therapy

## 2010-04-09 ENCOUNTER — Encounter: Payer: Self-pay | Admitting: Occupational Therapy

## 2010-04-09 ENCOUNTER — Ambulatory Visit: Payer: Self-pay | Admitting: Physical Therapy

## 2010-04-11 ENCOUNTER — Encounter (HOSPITAL_BASED_OUTPATIENT_CLINIC_OR_DEPARTMENT_OTHER): Payer: No Typology Code available for payment source | Admitting: Oncology

## 2010-04-11 ENCOUNTER — Ambulatory Visit: Payer: Self-pay | Admitting: Physical Therapy

## 2010-04-11 ENCOUNTER — Other Ambulatory Visit: Payer: Self-pay | Admitting: Oncology

## 2010-04-11 ENCOUNTER — Encounter: Payer: Self-pay | Admitting: Occupational Therapy

## 2010-04-11 DIAGNOSIS — C712 Malignant neoplasm of temporal lobe: Secondary | ICD-10-CM

## 2010-04-11 LAB — CBC WITH DIFFERENTIAL/PLATELET
Basophils Absolute: 0 10*3/uL (ref 0.0–0.1)
Eosinophils Absolute: 0 10*3/uL (ref 0.0–0.5)
HGB: 11.2 g/dL — ABNORMAL LOW (ref 13.0–17.1)
MCV: 101.9 fL — ABNORMAL HIGH (ref 79.3–98.0)
MONO#: 0.5 10*3/uL (ref 0.1–0.9)
NEUT#: 10.5 10*3/uL — ABNORMAL HIGH (ref 1.5–6.5)
Platelets: 72 10*3/uL — ABNORMAL LOW (ref 140–400)
RBC: 3.24 10*6/uL — ABNORMAL LOW (ref 4.20–5.82)
RDW: 15.5 % — ABNORMAL HIGH (ref 11.0–14.6)
WBC: 11.5 10*3/uL — ABNORMAL HIGH (ref 4.0–10.3)

## 2010-04-11 LAB — UA PROTEIN, DIPSTICK - CHCC: Protein, Urine: 100 mg/dL

## 2010-04-13 ENCOUNTER — Other Ambulatory Visit: Payer: Self-pay | Admitting: Oncology

## 2010-04-14 LAB — PROTEIN, URINE, 24 HOUR
Protein, 24H Urine: 3818 mg/d — ABNORMAL HIGH (ref 50–100)
Urine Total Volume-UPROT: 2300 mL

## 2010-04-18 ENCOUNTER — Other Ambulatory Visit: Payer: Self-pay | Admitting: Oncology

## 2010-04-18 ENCOUNTER — Encounter (HOSPITAL_BASED_OUTPATIENT_CLINIC_OR_DEPARTMENT_OTHER): Payer: No Typology Code available for payment source | Admitting: Oncology

## 2010-04-18 DIAGNOSIS — C712 Malignant neoplasm of temporal lobe: Secondary | ICD-10-CM

## 2010-04-18 LAB — CBC WITH DIFFERENTIAL/PLATELET
BASO%: 0.1 % (ref 0.0–2.0)
Eosinophils Absolute: 0 10*3/uL (ref 0.0–0.5)
MCHC: 33.7 g/dL (ref 32.0–36.0)
MONO#: 0.4 10*3/uL (ref 0.1–0.9)
NEUT#: 6.8 10*3/uL — ABNORMAL HIGH (ref 1.5–6.5)
RBC: 3.26 10*6/uL — ABNORMAL LOW (ref 4.20–5.82)
RDW: 16.3 % — ABNORMAL HIGH (ref 11.0–14.6)
WBC: 7.5 10*3/uL (ref 4.0–10.3)
lymph#: 0.3 10*3/uL — ABNORMAL LOW (ref 0.9–3.3)

## 2010-04-18 LAB — COMPREHENSIVE METABOLIC PANEL
ALT: 30 U/L (ref 0–53)
Albumin: 3.2 g/dL — ABNORMAL LOW (ref 3.5–5.2)
CO2: 22 mEq/L (ref 19–32)
Calcium: 7.3 mg/dL — ABNORMAL LOW (ref 8.4–10.5)
Chloride: 110 mEq/L (ref 96–112)
Potassium: 4.2 mEq/L (ref 3.5–5.3)
Sodium: 139 mEq/L (ref 135–145)
Total Bilirubin: 0.7 mg/dL (ref 0.3–1.2)
Total Protein: 4.8 g/dL — ABNORMAL LOW (ref 6.0–8.3)

## 2010-04-24 ENCOUNTER — Ambulatory Visit: Payer: No Typology Code available for payment source | Attending: Oncology | Admitting: Physical Therapy

## 2010-04-24 DIAGNOSIS — IMO0001 Reserved for inherently not codable concepts without codable children: Secondary | ICD-10-CM | POA: Insufficient documentation

## 2010-04-24 DIAGNOSIS — R269 Unspecified abnormalities of gait and mobility: Secondary | ICD-10-CM | POA: Insufficient documentation

## 2010-04-24 DIAGNOSIS — R5381 Other malaise: Secondary | ICD-10-CM | POA: Insufficient documentation

## 2010-04-24 DIAGNOSIS — M6281 Muscle weakness (generalized): Secondary | ICD-10-CM | POA: Insufficient documentation

## 2010-04-25 ENCOUNTER — Other Ambulatory Visit: Payer: Self-pay | Admitting: Oncology

## 2010-04-25 ENCOUNTER — Encounter (HOSPITAL_BASED_OUTPATIENT_CLINIC_OR_DEPARTMENT_OTHER): Payer: No Typology Code available for payment source | Admitting: Oncology

## 2010-04-25 DIAGNOSIS — C712 Malignant neoplasm of temporal lobe: Secondary | ICD-10-CM

## 2010-04-25 LAB — CBC WITH DIFFERENTIAL/PLATELET
BASO%: 0 % (ref 0.0–2.0)
EOS%: 1 % (ref 0.0–7.0)
HCT: 36.2 % — ABNORMAL LOW (ref 38.4–49.9)
LYMPH%: 17.9 % (ref 14.0–49.0)
MCH: 33 pg (ref 27.2–33.4)
MCHC: 32.3 g/dL (ref 32.0–36.0)
NEUT%: 74.7 % (ref 39.0–75.0)
RBC: 3.55 10*6/uL — ABNORMAL LOW (ref 4.20–5.82)
lymph#: 0.9 10*3/uL (ref 0.9–3.3)

## 2010-04-25 LAB — CHCC SMEAR

## 2010-04-25 NOTE — Procedures (Signed)
EEG NUMBER:  12 - [id number].  REFERRING PHYSICIAN:  Dr. Anne Hahn.  CLINICAL HISTORY:  A 50 year old patient with known glioblastoma who is being evaluated for seizures.  MEDICATION:  Keppra, levothyroxine, Zoloft, Norvasc, Benicar, Protonix, Catapres, Dilaudid, and Decadron.  This a portable EEG recorded the patient awake and drowsy using 17 channel machine and standard 10/20 electrode placement.  Background awake rhythm consists of 7-8 Hz alpha which is symmetric of higher amplitude on the right and diminished amplitude on the left. Intermittent 6-7 Hz theta slowing is seen bilaterally, diffuse, and generalized distribution.  Amplitude appears to be diminished over the whole left hemisphere.  No paroxysmal epileptiform activity spikes or sharp waves are seen.  Length of the recording is 22 minutes.  Definite sleep stages are not noted except mild drowsiness.  Technical component is average.  EKG tracing reveals regular sinus rhythm.  Hyperventilation and photic stimulation not performed this being a portable study.  IMPRESSION:  This EEG is slightly abnormal due to presence of amplitude asymmetry and mild left hemispheric slowing, but no definite epileptiform features were noted.     Pramod P. Pearlean Brownie, MD    ZOX:WRUE D:  03/28/2010 16:09:45  T:  03/29/2010 03:38:51  Job #:  [id number]

## 2010-04-26 ENCOUNTER — Encounter (HOSPITAL_BASED_OUTPATIENT_CLINIC_OR_DEPARTMENT_OTHER): Payer: No Typology Code available for payment source | Admitting: Oncology

## 2010-04-26 ENCOUNTER — Other Ambulatory Visit (HOSPITAL_COMMUNITY)
Admission: RE | Admit: 2010-04-26 | Discharge: 2010-04-26 | Disposition: A | Discharge status: Home / Self Care | Payer: No Typology Code available for payment source | Source: Ambulatory Visit | Attending: Oncology | Admitting: Oncology

## 2010-04-26 ENCOUNTER — Other Ambulatory Visit: Payer: Self-pay | Admitting: Oncology

## 2010-04-26 DIAGNOSIS — C712 Malignant neoplasm of temporal lobe: Secondary | ICD-10-CM

## 2010-04-26 DIAGNOSIS — D696 Thrombocytopenia, unspecified: Secondary | ICD-10-CM | POA: Insufficient documentation

## 2010-04-26 LAB — COMPREHENSIVE METABOLIC PANEL
ALT: 54 U/L — ABNORMAL HIGH (ref 0–53)
AST: 20 U/L (ref 0–37)
Albumin: 3 g/dL — ABNORMAL LOW (ref 3.5–5.2)
Alkaline Phosphatase: 47 U/L (ref 39–117)
Glucose, Bld: 106 mg/dL — ABNORMAL HIGH (ref 70–99)
Potassium: 4.2 mEq/L (ref 3.5–5.3)
Sodium: 143 mEq/L (ref 135–145)
Total Protein: 4.2 g/dL — ABNORMAL LOW (ref 6.0–8.3)

## 2010-04-26 LAB — CBC & DIFF AND RETIC
BASO%: 0 % (ref 0.0–2.0)
EOS%: 0.4 % (ref 0.0–7.0)
HGB: 10.5 g/dL — ABNORMAL LOW (ref 13.0–17.1)
MCH: 32.9 pg (ref 27.2–33.4)
MCHC: 32.3 g/dL (ref 32.0–36.0)
RDW: 14.9 % — ABNORMAL HIGH (ref 11.0–14.6)
Retic %: 2.2 % — ABNORMAL HIGH (ref 0.50–1.60)
Retic Ct Abs: 70.18 10*3/uL (ref 24.10–77.50)
lymph#: 0.7 10*3/uL — ABNORMAL LOW (ref 0.9–3.3)

## 2010-04-27 NOTE — H&P (Signed)
NAMEJOVEN, MOM                ACCOUNT NO.:  [id number]  MEDICAL RECORD NO.:  [id number]           PATIENT TYPE:  O  LOCATION:  XRAY                         FACILITY:  [facility]  PHYSICIAN:  Dominica Severin B. Benay Spice, M.D. DATE OF BIRTH:  01-Aug-1960  DATE OF ADMISSION:  03/21/2010 DATE OF DISCHARGE:  03/21/2010                             HISTORY & PHYSICAL   PATIENT IDENTIFICATION:  Mr. Elison is a 50 year old with a history of a left temporal brain glioblastoma multiforme.  He is now admitted with a fever, nausea, and worsened right-sided weakness.  HISTORY OF PRESENT ILLNESS:  Mr. Laabs was diagnosed with a glioblastoma multiforme involving the left temporal brain in October 2009.  He underwent initial gross surgical resection with placement of Gliadel Wafers.  He completed adjuvant temozolomide chemotherapy and radiation.  The tumor progressed on an MRI on Jul 12, 2008, and he was placed on a [facility] brain tumor study with Vorinostat, Temodar, and Avastin beginningin July 2010.  There was again disease progression on a brain MRI on May 25, 2009, and salvage therapy was initiated with irinotecan, carboplatin, and Avastin on May 30, 2009.  He has been without radiologic evidence for disease progression since beginning this regimen.  Carboplatin was deleted from the chemotherapy regimen after the first cycle due to excessive hematologic toxicity.  He continued irinotecan/Avastin on a 2-week schedule until he underwent restaging scans at [facility] on February 22, 2010.  A PET scan and MRI showed no evidence of an active brain tumor.  A decision was made to continue single-agent Avastin on a 3-week schedule.  Mr. Girton was last treated with Avastin on March 13, 2010.  He has complained of increased right hand and right lower leg weakness for the past several months.  When we saw him on March 21, 2010, he complained of progressive weakness and new edema.  He was noted to  have significant edema in the legs, hands, and periorbital areas.  He was felt to likely have nephrotic syndrome related to Avastin.  This was confirmed on a 24-hour urine for total protein on March 23, 2010, which found the 24-hour urine protein elevated at 3760 mg.  He was placed on Lasix, and the edema has improved.  He was also noted to have increased right-sided weakness on March 21, 2010.  A noncontrast CT of the brain showed no acute change.  The weakness was felt to potentially be related to an exacerbation of mild underlying weakness by the edema.  He came to the office today for an unscheduled visit.  His wife called yesterday and reported he had a fever of 101 degrees on one occasion. The fever has been lower today.  This morning, she noted he was confused and had increased expressive aphasia.  He also has increased right-sided weakness and ataxia.  He presented to the office for further evaluation.  PAST MEDICAL HISTORY: 1. Glioblastoma as per HPI. 2. Hypertension. 3. Hypothyroidism. 4. History of a generalized seizure - maintained on Keppra. 5. Right visual field deficit secondary to the glioblastoma. 6. Diagnosis of nephrotic syndrome February 2012.  CURRENT MEDICATIONS: 1.  Clonidine 0.1 mg b.i.d. 2. Lasix 20 mg b.i.d. 3. Potassium chloride 20 mEq b.i.d. 4. Protonix 40 mg daily. 5. Benicar 40 mg daily. 6. Norvasc 10 mg daily. 7. Claritin 10 mg daily p.r.n. 8. Keppra 500 mg b.i.d. 9. Synthroid 75 mcg daily. 10.Tylenol p.r.n. 11.Imodium p.r.n. 12.Hydrocortisone 10 mg b.i.d. 13.Zoloft 50 mg daily.  ALLERGIES:  No known drug allergies.  FAMILY HISTORY:  Noncontributory.  SOCIAL HISTORY:  Lives with his wife in [place].  He works in a business occupation.  REVIEW OF SYSTEMS:  CONSTITUTIONAL:  He had a fever of 101 degrees on March 26, 2010.  He reports a lower fever today.  RESPIRATORY: Negative.  CARDIAC:  Negative.  GU:  Negative.  GI:  No  diarrhea.  He had an episode of nausea and vomiting in the office today.  SKIN: Negative.  INFECTIOUS DISEASE:  Negative.  NEUROLOGIC:  Ms. Moree reports she has noted increased expressive aphasia, confusion, and increased right-sided weakness today.  PHYSICAL EXAMINATION:  VITAL SIGNS:  Blood pressure 134/86, pulse 77, temperature 96.9. HEENT:  There are dried secretions over the tongue and palate.  No thrush.  No ulcerations. LUNGS:  Clear bilaterally.  No respiratory distress. CARDIAC:  Regular rhythm. ABDOMEN:  Nontender. EXTREMITIES:  Trace edema at the right hand.  No leg edema. NEUROLOGIC:  He is alert.  He has an expressive aphasia.  There is 3+ to 4/5 strength at the right hand and 4/5 strength of the right arm.  He ambulates with an unsteady gait and drags the right foot. MUSCULOSKELETAL:  There is tenderness along the left temporal parietal scalp.  No rash.  No fluctuance.  LABORATORY DATA:  Labs from March 21, 2010; hemoglobin 12.2, platelets 78,000, white count 6.3, ANC 4.2, creatinine 1.14, potassium 3.8.  A 24-hour urine total protein on March 23, 2010 was 3760 mg.  IMPRESSION: 1. Glioblastoma multiforme diagnosed in October 2009 - currently being     treated with single-agent Avastin, last given on March 13, 2010. 2. Increased right-sided weakness, expressive aphasia, and ? confusion     - etiology unclear.  A restaging brain MRI and PET scan at [facility] on     February 22, 2010, revealed no evidence for progression of the GBM.     A CT of the brain on March 21, 2010, revealed no acute change. 3. Fever with no apparent source for infection on review of his     history and physical. 4. Thrombocytopenia on March 21, 2010 - likely related to     irinotecan last given on February 20, 2010. 5. Hypertension - maintained on medical therapy. 6. History of hypothyroidism. 7. New diagnosis of nephrotic syndrome - improvement in edema noted     with  furosemide. 8. Remote history of a generalized seizure - maintained on Keppra.  PLAN:  Mr. Brix presents today with a marked change in his neurologic status.  He has a fever.  He will be admitted for further diagnostic evaluation.  The differential diagnosis for the neurologic symptoms includes progression of the glioblastoma, a hemorrhagic or thrombotic CVA, and infection.  We will obtain an MRI of the brain today.  I discussed the case with Dr. Anne Hahn, and he will see Mr. Schlabach in consultation.     Leighton Roach Truett Perna, M.D.     GBS/MEDQ  D:  03/27/2010  T:  03/27/2010  Job:  [id number]  cc:   Jeannett Senior A. Evlyn Kanner, M.D. Fax: [phone]  Gordy Savers, MD [phone]  [address] [place]  [phone]  Encarnacion Slates, M.D.  Jill Alexanders, M.D. Fax: [phone]  Electronically Signed by Betsy Coder M.D. on 04/26/2010 08:29:14 PM

## 2010-04-27 NOTE — Discharge Summary (Signed)
[name], [name]                ACCOUNT NO.:  [id number]  MEDICAL RECORD NO.:  [id number]           PATIENT TYPE:  I  LOCATION:  5409                         FACILITY:  [facility]  PHYSICIAN:  Izola Price. Benay Spice, M.D. DATE OF BIRTH:  04-12-1960  DATE OF ADMISSION:  03/27/2010 DATE OF DISCHARGE:  03/30/2010                              DISCHARGE SUMMARY   CONDITION AT DISCHARGE:  Improved.  DISCHARGE DIAGNOSES: 1. Admission with progression of right-sided weakness,     expressive/receptive aphasia, and confusion.  Symptoms improved     during this hospital admission with Decadron therapy. 2. Fever with no apparent source for infection, resolved at discharge. 3. Thrombocytopenia, likely related to Avastin therapy. 4. Right visual field hallucinations, ? seizure equivalent. 5. Urinary retention, status post placement of a Foley catheter. 6. Nephrotic syndrome. 7. Hypertension. 8. Hypothyroidism.  HOSPITAL CONSULTATIONS:1. Jill Alexanders, M.D., Neurology 2. Reece Packer, MD, Urology.  HOSPITAL COURSE:  [name] is 50 year old with history of a left brain glioblastoma multiforme.  He is currently being treated with every 3-week Avastin therapy, last given on March 13, 2010.  He presented to the office on February 7 with an acute worsening of right-sided weakness, expressive/receptive aphasia, and confusion.  He also had a fever on February 7.  He was admitted for further evaluation.  There was no apparent source for infection upon review of his history and physical.  An initial portable chest x-ray on February 8 suggested bilateral infiltrates, but a repeat PA and lateral chest x-ray on February 8 revealed no acute finding.  Cultures of the blood and urine were negative.  His fever resolved during this hospital admission.  An MRI of the brain on February 8 was compared to a previous MRI from October 12, 2009.  There were findings suggestive of progressive tumor in the  left temporal lobe.  No acute findings were noted.  He was placed on Decadron therapy and experienced significant improvement in the right-sided weakness and expressive aphasia.  The confusion also improved.  He was noted to have right visual field hallucinations over the last few days of this admission.  Dr. Jannifer Franklin felt this could potentially be a seizure equivalent.  The Keppra dose was increased to 750 mg twice daily.  An EEG was performed and the interpretation is pending at the time of discharge.  He received narcotic analgesics for treatment of a left-sided headache. The headache had improved at the time of discharge.  He developed urinary retention.  This required placement of a Foley catheter after a bladder scan confirmed a large amount of urine in the bladder.  Dr. Matilde Sprang was consulted for placement of the Foley catheter.  The Foley catheter remains in place at the time of discharge. He was placed on Flomax per the recommendation of Dr. Serita Butcher.  He will see Dr. Matilde Sprang on February 13 for a voiding trial.  [name] was diagnosed with nephrotic syndrome during the week prior to hospital admission.  The peripheral edema improved with furosemide therapy.  Furosemide was held during this hospital admission, and he did not have a significant  return of the edema.  We will make a decision on resuming furosemide based on his clinical status over the next few weeks.  He was ambulatory and appeared stable for discharge on February 10. Arrangements were made for outpatient physical therapy.  DISCHARGE INSTRUCTIONS:  He will follow up with Dr. Matilde Sprang on February 13 for removal of the Foley catheter and a voiding trial.  He will see Dr. Benay Spice on February 14.  He will call for a fever or progressive neurologic symptoms.  DISCHARGE MEDICATIONS:  See discharge medication manager.     Izola Price Benay Spice, M.D.     GBS/MEDQ  D:  03/31/2010  T:  03/31/2010  Job:   [id number]  cc:   Reece Packer, MD Fax: [phone]  C. Floyde Parkins, M.D. Fax: [place], MD [place] [address]  Stephen A. Forde Dandy, M.D. Fax: [phone]  Dr. Encarnacion Slates  Electronically Signed by Betsy Coder M.D. on 04/26/2010 08:29:38 PM

## 2010-04-28 ENCOUNTER — Encounter (HOSPITAL_BASED_OUTPATIENT_CLINIC_OR_DEPARTMENT_OTHER): Payer: No Typology Code available for payment source | Admitting: Oncology

## 2010-04-28 DIAGNOSIS — E538 Deficiency of other specified B group vitamins: Secondary | ICD-10-CM

## 2010-04-29 ENCOUNTER — Encounter (HOSPITAL_BASED_OUTPATIENT_CLINIC_OR_DEPARTMENT_OTHER): Payer: No Typology Code available for payment source | Admitting: Oncology

## 2010-04-29 DIAGNOSIS — E538 Deficiency of other specified B group vitamins: Secondary | ICD-10-CM

## 2010-04-30 ENCOUNTER — Ambulatory Visit: Payer: No Typology Code available for payment source | Admitting: Physical Therapy

## 2010-05-02 ENCOUNTER — Ambulatory Visit: Payer: No Typology Code available for payment source | Admitting: Physical Therapy

## 2010-05-03 ENCOUNTER — Encounter (HOSPITAL_BASED_OUTPATIENT_CLINIC_OR_DEPARTMENT_OTHER): Payer: No Typology Code available for payment source | Admitting: Oncology

## 2010-05-03 ENCOUNTER — Other Ambulatory Visit: Payer: Self-pay | Admitting: Oncology

## 2010-05-03 DIAGNOSIS — C712 Malignant neoplasm of temporal lobe: Secondary | ICD-10-CM

## 2010-05-03 LAB — CBC WITH DIFFERENTIAL/PLATELET
BASO%: 0.2 % (ref 0.0–2.0)
Basophils Absolute: 0 10*3/uL (ref 0.0–0.1)
EOS%: 0 % (ref 0.0–7.0)
HCT: 34.8 % — ABNORMAL LOW (ref 38.4–49.9)
HGB: 11.2 g/dL — ABNORMAL LOW (ref 13.0–17.1)
LYMPH%: 16.7 % (ref 14.0–49.0)
MCH: 33 pg (ref 27.2–33.4)
MCHC: 32.2 g/dL (ref 32.0–36.0)
NEUT%: 74.9 % (ref 39.0–75.0)
Platelets: 26 10*3/uL — ABNORMAL LOW (ref 140–400)

## 2010-05-08 ENCOUNTER — Encounter (HOSPITAL_BASED_OUTPATIENT_CLINIC_OR_DEPARTMENT_OTHER): Payer: No Typology Code available for payment source | Admitting: Oncology

## 2010-05-08 ENCOUNTER — Other Ambulatory Visit: Payer: Self-pay | Admitting: Oncology

## 2010-05-08 DIAGNOSIS — C712 Malignant neoplasm of temporal lobe: Secondary | ICD-10-CM

## 2010-05-08 LAB — PREPARE PLATELETS

## 2010-05-08 LAB — CBC WITH DIFFERENTIAL/PLATELET
Basophils Absolute: 0 10*3/uL (ref 0.0–0.1)
EOS%: 0 % (ref 0.0–7.0)
Eosinophils Absolute: 0 10*3/uL (ref 0.0–0.5)
HGB: 11.3 g/dL — ABNORMAL LOW (ref 13.0–17.1)
LYMPH%: 25 % (ref 14.0–49.0)
MCH: 33.2 pg (ref 27.2–33.4)
MCV: 100.3 fL — ABNORMAL HIGH (ref 79.3–98.0)
MONO%: 8.3 % (ref 0.0–14.0)
Platelets: 34 10*3/uL — ABNORMAL LOW (ref 140–400)
RBC: 3.4 10*6/uL — ABNORMAL LOW (ref 4.20–5.82)
RDW: 15.4 % — ABNORMAL HIGH (ref 11.0–14.6)

## 2010-05-08 LAB — TECHNOLOGIST REVIEW

## 2010-05-10 ENCOUNTER — Emergency Department (HOSPITAL_COMMUNITY)
Admission: EM | Admit: 2010-05-10 | Discharge: 2010-05-10 | Disposition: A | Discharge status: Home / Self Care | Payer: No Typology Code available for payment source | Source: Home / Self Care | Attending: Emergency Medicine | Admitting: Emergency Medicine

## 2010-05-10 ENCOUNTER — Ambulatory Visit: Payer: No Typology Code available for payment source | Admitting: Physical Therapy

## 2010-05-10 ENCOUNTER — Encounter: Payer: No Typology Code available for payment source | Admitting: Oncology

## 2010-05-10 ENCOUNTER — Inpatient Hospital Stay (HOSPITAL_COMMUNITY)
Admission: AD | Admit: 2010-05-10 | Discharge: 2010-05-12 | DRG: 055 | Disposition: A | Discharge status: Home / Self Care | Payer: No Typology Code available for payment source | Source: Ambulatory Visit | Attending: Oncology | Admitting: Oncology

## 2010-05-10 DIAGNOSIS — C719 Malignant neoplasm of brain, unspecified: Principal | ICD-10-CM | POA: Diagnosis present

## 2010-05-10 DIAGNOSIS — T380X5A Adverse effect of glucocorticoids and synthetic analogues, initial encounter: Secondary | ICD-10-CM | POA: Diagnosis present

## 2010-05-10 DIAGNOSIS — H5340 Unspecified visual field defects: Secondary | ICD-10-CM | POA: Diagnosis present

## 2010-05-10 DIAGNOSIS — R29898 Other symptoms and signs involving the musculoskeletal system: Secondary | ICD-10-CM | POA: Diagnosis present

## 2010-05-10 DIAGNOSIS — E039 Hypothyroidism, unspecified: Secondary | ICD-10-CM | POA: Diagnosis present

## 2010-05-10 DIAGNOSIS — F603 Borderline personality disorder: Secondary | ICD-10-CM | POA: Diagnosis present

## 2010-05-10 DIAGNOSIS — Z9481 Bone marrow transplant status: Secondary | ICD-10-CM

## 2010-05-10 DIAGNOSIS — F29 Unspecified psychosis not due to a substance or known physiological condition: Secondary | ICD-10-CM | POA: Diagnosis present

## 2010-05-10 DIAGNOSIS — N049 Nephrotic syndrome with unspecified morphologic changes: Secondary | ICD-10-CM | POA: Diagnosis present

## 2010-05-10 DIAGNOSIS — E538 Deficiency of other specified B group vitamins: Secondary | ICD-10-CM | POA: Diagnosis present

## 2010-05-10 DIAGNOSIS — I10 Essential (primary) hypertension: Secondary | ICD-10-CM | POA: Diagnosis present

## 2010-05-10 DIAGNOSIS — D696 Thrombocytopenia, unspecified: Secondary | ICD-10-CM | POA: Diagnosis present

## 2010-05-10 LAB — MAGNESIUM: Magnesium: 2.4 mg/dL (ref 1.5–2.5)

## 2010-05-10 LAB — CBC
HCT: 41.1 % (ref 39.0–52.0)
Hemoglobin: 13.2 g/dL (ref 13.0–17.0)
MCH: 33.1 pg (ref 26.0–34.0)
MCHC: 32.1 g/dL (ref 30.0–36.0)

## 2010-05-10 LAB — COMPREHENSIVE METABOLIC PANEL
CO2: 26 mEq/L (ref 19–32)
Calcium: 8.2 mg/dL — ABNORMAL LOW (ref 8.4–10.5)
Creatinine, Ser: 1.21 mg/dL (ref 0.4–1.5)
GFR calc Af Amer: >60 mL/min (ref >60)
GFR calc non Af Amer: >60 mL/min (ref >60)
Glucose, Bld: 156 mg/dL — ABNORMAL HIGH (ref 70–99)

## 2010-05-11 ENCOUNTER — Ambulatory Visit: Payer: No Typology Code available for payment source | Admitting: Physical Therapy

## 2010-05-11 LAB — TSH: TSH: 0.814 u[IU]/mL (ref 0.350–4.500)

## 2010-05-12 DIAGNOSIS — C719 Malignant neoplasm of brain, unspecified: Secondary | ICD-10-CM

## 2010-05-12 DIAGNOSIS — F29 Unspecified psychosis not due to a substance or known physiological condition: Secondary | ICD-10-CM

## 2010-05-12 LAB — BASIC METABOLIC PANEL
Chloride: 108 mEq/L (ref 96–112)
GFR calc Af Amer: >60 mL/min (ref >60)
Potassium: 4.2 mEq/L (ref 3.5–5.1)

## 2010-05-12 LAB — CBC
Platelets: 42 10*3/uL — ABNORMAL LOW (ref 150–400)
RBC: 3.24 MIL/uL — ABNORMAL LOW (ref 4.22–5.81)
WBC: 3.7 10*3/uL — ABNORMAL LOW (ref 4.0–10.5)

## 2010-05-12 NOTE — Consult Note (Signed)
  [name], [name]                ACCOUNT NO.:  [id number]  MEDICAL RECORD NO.:  [id number]           PATIENT TYPE:  I  LOCATION:  7124                         FACILITY:  [facility]  PHYSICIAN:  Marlou Sa, MD DATE OF BIRTH:  09/09/1960  DATE OF CONSULTATION:  05/11/2010 DATE OF DISCHARGE:  05/10/2010                                CONSULTATION   REASON FOR CONSULT:  Aggressive unwarranted behavior.  HISTORY OF PRESENT ILLNESS:  I saw the patient and reviewed the medical records.  I spoke with the patient in the presence of the wife after patient's consent.  The patient was diagnosed with left brain glioblastoma multiforme in 2009 and got the treatment for it.  The patient also has a history of thrombocytopenia.  I spoke in length with the patient and gave the supportive psychotherapy.  The patient's wife told me that he has anger problem throughout his life, but after the diagnosis of the cancer in the brain, his anger was increasing and recently becoming worse.  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  I told the patient that there are people who have so many medical problems that developed at young age and he is lucky that he was healthy throughout his life and that this problem just recently started.  I also told the patient to medicate, and with medication it will also help him; not in the treatment of the cancer, but also to control his anger.  I discussed with the patient on the religious grounds also.  Regarding the medications, I told that because of thrombocytopenia, I will not start Depakote; and also because of left brain glioblastoma, I will not start Risperdal/antipsychotic medication.  The patient is getting Ativan 1 mg at  bedtime.  I told him I can first try the Klonopin 0.5 mg twice a day which can help him, both for anxiety and angriness. The patient agreed on this.  I started the patient on Klonopin 0.5 mg p.o. twice a day.  I also told the patient that whenever he is in the hospital, he can call for me and I will see him and will discuss about his treatment options.  Thanks for involving me in taking care of this patient.     Marlou Sa, MD     SA/MEDQ  D:  05/11/2010  T:  05/11/2010  Job:  [id number]  Electronically Signed by Marlou Sa  on 05/12/2010 07:25:14 AM

## 2010-05-15 ENCOUNTER — Ambulatory Visit: Payer: No Typology Code available for payment source | Admitting: Rehabilitative and Restorative Service Providers"

## 2010-05-18 ENCOUNTER — Ambulatory Visit: Payer: No Typology Code available for payment source | Admitting: Physical Therapy

## 2010-05-22 ENCOUNTER — Ambulatory Visit: Payer: No Typology Code available for payment source | Admitting: Physical Therapy

## 2010-05-22 NOTE — H&P (Signed)
NAMEULAS, ZUERCHER                ACCOUNT NO.:  [id number]  MEDICAL RECORD NO.:  [id number]           PATIENT TYPE:  I  LOCATION:  0272                         FACILITY:  [facility]  PHYSICIAN:  Ma Rings, M.D.DATE OF BIRTH:  Jun 10, 1960  DATE OF ADMISSION:  05/10/2010 DATE OF DISCHARGE:                             HISTORY & PHYSICAL   PATIENT IDENTIFICATION:  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  He is now admitted with increased confusion/agitation and violent behavior.  HISTORY OF PRESENT ILLNESS:  Mr. 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  He underwent initial surgical resection with placement of Gliadel wafers.  He has subsequently been treated with radiation and multiple systemic therapies.  He is currently maintained off of specific therapy for the glioblastoma.  He was found to have disease progression in February.  He has been unable to resume treatment due to thrombocytopenia.  We were telephoned earlier today by his family with a report of violent behavior.  He has been yelling at multiple family members including his wife, daughter, and mother.  He drove his car to his office this afternoon against the advice of his family and physicians.  The family located him and brought him to the [facility] for evaluation.  He acknowledges the aggressive behavior today and reports "I will not do this again." Multiple family members including his wife, aunts, and mother-in-law confirm his aggressive behavior over the past several weeks.  They say this has been a recurrent pattern and they are fearful for their safety.  PAST MEDICAL HISTORY: 1. Glioblastoma as per HPI. 2. Hypertension. 3. Hypothyroidism. 4. 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 5. Right visual field deficit secondary to the glioblastoma. 6. Nephrotic syndrome diagnosed in February 2012, improved with  Lasix. 7. Thrombocytopenia, etiology unclear.  Status post a diagnostic bone     marrow biopsy on April 26, 2010, which revealed a hypocellular bone     marrow with decreased megakaryocytes.  The cytogenetics from the     bone marrow biopsy is pending. 8. Right hand, arm and right leg weakness due to progression of the     left brain GBM, partially improved with Decadron.  CURRENT MEDICATIONS: 1. Decadron 2 mg p.o. daily. 2. Vitamin B12 1000 mcg p.o. daily. 3. Potassium chloride 20 mEq b.i.d. 4. Lasix 40 mg b.i.d. 5. Percocet 5/325 p.r.n. 6. Clonidine 0.1 mg b.i.d. 7. Protonix 40 mg daily. 8. Ativan 1 mg at bedtime p.r.n. 9. Norvasc 10 mg daily. 10.Claritin 10 mg daily p.r.n. 11.Keppra 750 mg b.i.d. 12.Synthroid 50 mcg daily. 13.Tylenol 1000 mg q.6 h p.r.n. 14.Imodium p.r.n. 15.Benicar 40 mg daily. 16.Zoloft 50 mg daily.  ALLERGIES:  No known drug allergies.  FAMILY HISTORY:  Noncontributory.  SOCIAL HISTORY:  He lives with his wife in [place].  He does not use tobacco.  He works in a business occupation.  REVIEW OF SYSTEMS:  CONSTITUTIONAL:  He denies fever.  RESPIRATORY: Negative.  HEART:  Negative.  GU:  Negative, aside from increased urinary frequency since starting Lasix.  GI:  Negative.  INFECTIOUS DISEASE:  Negative.  MUSCULOSKELETAL:  He complains of pain at the site of ecchymoses over the legs.  NEUROLOGIC:  He is alert and oriented. There is 4/5 strength in the right arm and hand.  There is a slight right facial droop.  There is a right visual field deficit.  Labs are pending.  IMPRESSION: 1. Left brain glioblastoma multiforme, progressive on an MRI of the     brain March 27, 2010.  Currently maintained off of specific     therapy. 2. Confusion, anger/aggressive behavior ? Related to Decadron, the     glioblastoma, or psychosis. 3. Thrombocytopenia, etiology unclear.  Status post a bone marrow     biopsy on 03/08 with cytogenetics pending. 4. Nephrotic  syndrome, maintained on Lasix with improvement in     peripheral edema. 5. Right visual field deficit secondary to the glioblastoma     multiforme. 6. Right-sided weakness secondary to the glioblastoma multiforme. 7. 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 8. History of hypothyroidism. 9. History of hypertension.  PLAN:  Mr. Dupuis is now admitted for safety concerns due to his angry/aggressive behavior toward multiple family members.  This has been a consistent pattern of behavior over the past several weeks.  When I interviewed him in the office, he had some insight into his behavior. His wife and other family members do not feel safe having him at home.  It is not clear to me whether his behavior is related to a psychosis, a direct effect of the glioblastoma, or complications from polypharmacy.  He will be admitted for observation and we will administer Ativan as needed for agitation.  I consulted Dr. Sherlynn Stalls from psychiatry.  We will ask the psychiatry service to consider the indication for a psychiatric hospital admission.     Ma Rings, M.D.     GBS/MEDQ  D:  05/10/2010  T:  05/10/2010  Job:  [id number]  cc:   Durene Cal, MD, PhD [place], [place] [address]  Ishmael Holter. Forde Dandy, M.D. Fax: [place], [place] [address]  C. Floyde Parkins, M.D. Fax: [phone]  Electronically Signed by Betsy Coder M.D. on 05/22/2010 01:14:03 PM

## 2010-05-23 ENCOUNTER — Encounter (HOSPITAL_BASED_OUTPATIENT_CLINIC_OR_DEPARTMENT_OTHER): Payer: No Typology Code available for payment source | Admitting: Oncology

## 2010-05-23 ENCOUNTER — Other Ambulatory Visit: Payer: Self-pay | Admitting: Oncology

## 2010-05-23 DIAGNOSIS — E039 Hypothyroidism, unspecified: Secondary | ICD-10-CM

## 2010-05-23 DIAGNOSIS — R509 Fever, unspecified: Secondary | ICD-10-CM

## 2010-05-23 DIAGNOSIS — C712 Malignant neoplasm of temporal lobe: Secondary | ICD-10-CM

## 2010-05-23 DIAGNOSIS — E538 Deficiency of other specified B group vitamins: Secondary | ICD-10-CM

## 2010-05-23 LAB — BASIC METABOLIC PANEL
CO2: 20 mEq/L (ref 19–32)
Calcium: 8.4 mg/dL (ref 8.4–10.5)
Chloride: 105 mEq/L (ref 96–112)
Glucose, Bld: 116 mg/dL — ABNORMAL HIGH (ref 70–99)
Sodium: 138 mEq/L (ref 135–145)

## 2010-05-23 LAB — CBC WITH DIFFERENTIAL/PLATELET
Eosinophils Absolute: 0 10*3/uL (ref 0.0–0.5)
HCT: 27.3 % — ABNORMAL LOW (ref 38.4–49.9)
LYMPH%: 26.5 % (ref 14.0–49.0)
MONO#: 0.5 10*3/uL (ref 0.1–0.9)
NEUT#: 2.5 10*3/uL (ref 1.5–6.5)
Platelets: 101 10*3/uL — ABNORMAL LOW (ref 140–400)
RBC: 2.79 10*6/uL — ABNORMAL LOW (ref 4.20–5.82)
WBC: 4.3 10*3/uL (ref 4.0–10.3)
lymph#: 1.1 10*3/uL (ref 0.9–3.3)

## 2010-05-24 ENCOUNTER — Ambulatory Visit: Payer: Medicare Other | Attending: Oncology | Admitting: Physical Therapy

## 2010-05-24 DIAGNOSIS — R269 Unspecified abnormalities of gait and mobility: Secondary | ICD-10-CM | POA: Insufficient documentation

## 2010-05-24 DIAGNOSIS — M6281 Muscle weakness (generalized): Secondary | ICD-10-CM | POA: Insufficient documentation

## 2010-05-24 DIAGNOSIS — IMO0001 Reserved for inherently not codable concepts without codable children: Secondary | ICD-10-CM | POA: Insufficient documentation

## 2010-05-24 DIAGNOSIS — R5381 Other malaise: Secondary | ICD-10-CM | POA: Insufficient documentation

## 2010-05-28 ENCOUNTER — Encounter (HOSPITAL_BASED_OUTPATIENT_CLINIC_OR_DEPARTMENT_OTHER): Payer: No Typology Code available for payment source | Admitting: Oncology

## 2010-05-28 ENCOUNTER — Other Ambulatory Visit: Payer: Self-pay | Admitting: Oncology

## 2010-05-28 DIAGNOSIS — Z5112 Encounter for antineoplastic immunotherapy: Secondary | ICD-10-CM

## 2010-05-28 DIAGNOSIS — C712 Malignant neoplasm of temporal lobe: Secondary | ICD-10-CM

## 2010-05-28 DIAGNOSIS — R3589 Other polyuria: Secondary | ICD-10-CM

## 2010-05-28 DIAGNOSIS — E538 Deficiency of other specified B group vitamins: Secondary | ICD-10-CM

## 2010-05-28 DIAGNOSIS — D696 Thrombocytopenia, unspecified: Secondary | ICD-10-CM

## 2010-05-28 DIAGNOSIS — R358 Other polyuria: Secondary | ICD-10-CM

## 2010-05-28 LAB — CBC WITH DIFFERENTIAL/PLATELET
BASO%: 0.2 % (ref 0.0–2.0)
Basophils Absolute: 0 10*3/uL (ref 0.0–0.1)
EOS%: 1.3 % (ref 0.0–7.0)
HCT: 32.9 % — ABNORMAL LOW (ref 38.4–49.9)
LYMPH%: 38 % (ref 14.0–49.0)
MCH: 32.4 pg (ref 27.2–33.4)
MCHC: 33.1 g/dL (ref 32.0–36.0)
MCV: 97.9 fL (ref 79.3–98.0)
MONO%: 14.9 % — ABNORMAL HIGH (ref 0.0–14.0)
NEUT%: 45.6 % (ref 39.0–75.0)
Platelets: 119 10*3/uL — ABNORMAL LOW (ref 140–400)
lymph#: 2 10*3/uL (ref 0.9–3.3)

## 2010-06-04 ENCOUNTER — Encounter (HOSPITAL_BASED_OUTPATIENT_CLINIC_OR_DEPARTMENT_OTHER): Payer: Medicare Other | Admitting: Oncology

## 2010-06-04 ENCOUNTER — Other Ambulatory Visit: Payer: Self-pay | Admitting: Oncology

## 2010-06-04 DIAGNOSIS — C712 Malignant neoplasm of temporal lobe: Secondary | ICD-10-CM

## 2010-06-04 LAB — CBC WITH DIFFERENTIAL/PLATELET
BASO%: 0.5 % (ref 0.0–2.0)
HCT: 29.8 % — ABNORMAL LOW (ref 38.4–49.9)
LYMPH%: 30.1 % (ref 14.0–49.0)
MCH: 34.1 pg — ABNORMAL HIGH (ref 27.2–33.4)
MCHC: 34.9 g/dL (ref 32.0–36.0)
MCV: 97.7 fL (ref 79.3–98.0)
MONO%: 15.8 % — ABNORMAL HIGH (ref 0.0–14.0)
NEUT%: 52.8 % (ref 39.0–75.0)
Platelets: 125 10*3/uL — ABNORMAL LOW (ref 140–400)
RBC: 3.05 10*6/uL — ABNORMAL LOW (ref 4.20–5.82)

## 2010-06-07 ENCOUNTER — Encounter: Payer: Self-pay | Admitting: Internal Medicine

## 2010-06-11 ENCOUNTER — Other Ambulatory Visit: Payer: Self-pay | Admitting: Oncology

## 2010-06-11 ENCOUNTER — Encounter (HOSPITAL_BASED_OUTPATIENT_CLINIC_OR_DEPARTMENT_OTHER): Payer: No Typology Code available for payment source | Admitting: Oncology

## 2010-06-11 DIAGNOSIS — D696 Thrombocytopenia, unspecified: Secondary | ICD-10-CM

## 2010-06-11 DIAGNOSIS — C712 Malignant neoplasm of temporal lobe: Secondary | ICD-10-CM

## 2010-06-11 DIAGNOSIS — E538 Deficiency of other specified B group vitamins: Secondary | ICD-10-CM

## 2010-06-11 LAB — CBC WITH DIFFERENTIAL/PLATELET
BASO%: 0.4 % (ref 0.0–2.0)
EOS%: 0.9 % (ref 0.0–7.0)
HCT: 30.5 % — ABNORMAL LOW (ref 38.4–49.9)
LYMPH%: 35.2 % (ref 14.0–49.0)
MCH: 33.8 pg — ABNORMAL HIGH (ref 27.2–33.4)
MCHC: 34.1 g/dL (ref 32.0–36.0)
MONO#: 0.4 10*3/uL (ref 0.1–0.9)
NEUT%: 56.5 % (ref 39.0–75.0)
RBC: 3.08 10*6/uL — ABNORMAL LOW (ref 4.20–5.82)
WBC: 5.9 10*3/uL (ref 4.0–10.3)
lymph#: 2.1 10*3/uL (ref 0.9–3.3)

## 2010-06-11 LAB — BASIC METABOLIC PANEL
CO2: 19 mEq/L (ref 19–32)
Calcium: 9.1 mg/dL (ref 8.4–10.5)
Chloride: 109 mEq/L (ref 96–112)
Creatinine, Ser: 2.16 mg/dL — ABNORMAL HIGH (ref 0.40–1.50)
Sodium: 139 mEq/L (ref 135–145)

## 2010-06-12 ENCOUNTER — Telehealth: Payer: Self-pay

## 2010-06-12 ENCOUNTER — Ambulatory Visit: Payer: Self-pay | Admitting: Internal Medicine

## 2010-06-12 NOTE — Telephone Encounter (Signed)
Per Dr. Amador Cunas cx appt - no charge fee

## 2010-06-14 ENCOUNTER — Other Ambulatory Visit: Payer: Self-pay | Admitting: Oncology

## 2010-06-14 ENCOUNTER — Ambulatory Visit (HOSPITAL_COMMUNITY)
Admission: RE | Admit: 2010-06-14 | Discharge: 2010-06-14 | Disposition: A | Discharge status: Home / Self Care | Payer: Medicare Other | Source: Ambulatory Visit | Attending: Oncology | Admitting: Oncology

## 2010-06-14 ENCOUNTER — Encounter (HOSPITAL_BASED_OUTPATIENT_CLINIC_OR_DEPARTMENT_OTHER): Payer: Medicare Other | Admitting: Oncology

## 2010-06-14 DIAGNOSIS — K5792 Diverticulitis of intestine, part unspecified, without perforation or abscess without bleeding: Secondary | ICD-10-CM

## 2010-06-14 DIAGNOSIS — C712 Malignant neoplasm of temporal lobe: Secondary | ICD-10-CM

## 2010-06-14 DIAGNOSIS — K5732 Diverticulitis of large intestine without perforation or abscess without bleeding: Secondary | ICD-10-CM | POA: Insufficient documentation

## 2010-06-14 DIAGNOSIS — R109 Unspecified abdominal pain: Secondary | ICD-10-CM | POA: Insufficient documentation

## 2010-06-14 DIAGNOSIS — K4020 Bilateral inguinal hernia, without obstruction or gangrene, not specified as recurrent: Secondary | ICD-10-CM | POA: Insufficient documentation

## 2010-06-14 LAB — BASIC METABOLIC PANEL
CO2: 18 mEq/L — ABNORMAL LOW (ref 19–32)
Chloride: 105 mEq/L (ref 96–112)
Creatinine, Ser: 1.88 mg/dL — ABNORMAL HIGH (ref 0.40–1.50)
Potassium: 4.3 mEq/L (ref 3.5–5.3)

## 2010-06-20 ENCOUNTER — Other Ambulatory Visit: Payer: Self-pay | Admitting: Oncology

## 2010-06-20 ENCOUNTER — Encounter (HOSPITAL_BASED_OUTPATIENT_CLINIC_OR_DEPARTMENT_OTHER): Payer: No Typology Code available for payment source | Admitting: Oncology

## 2010-06-20 DIAGNOSIS — N19 Unspecified kidney failure: Secondary | ICD-10-CM

## 2010-06-20 DIAGNOSIS — Z5111 Encounter for antineoplastic chemotherapy: Secondary | ICD-10-CM

## 2010-06-20 DIAGNOSIS — C712 Malignant neoplasm of temporal lobe: Secondary | ICD-10-CM

## 2010-06-20 LAB — CBC WITH DIFFERENTIAL/PLATELET
Basophils Absolute: 0 10*3/uL (ref 0.0–0.1)
Eosinophils Absolute: 0.1 10*3/uL (ref 0.0–0.5)
HCT: 28.1 % — ABNORMAL LOW (ref 38.4–49.9)
HGB: 9.2 g/dL — ABNORMAL LOW (ref 13.0–17.1)
LYMPH%: 38.2 % (ref 14.0–49.0)
MCV: 99.6 fL — ABNORMAL HIGH (ref 79.3–98.0)
MONO#: 0.3 10*3/uL (ref 0.1–0.9)
MONO%: 8.6 % (ref 0.0–14.0)
NEUT#: 1.8 10*3/uL (ref 1.5–6.5)
NEUT%: 50.9 % (ref 39.0–75.0)
Platelets: 77 10*3/uL — ABNORMAL LOW (ref 140–400)
WBC: 3.6 10*3/uL — ABNORMAL LOW (ref 4.0–10.3)
nRBC: 0 % (ref 0–0)

## 2010-06-27 ENCOUNTER — Other Ambulatory Visit: Payer: Self-pay | Admitting: Oncology

## 2010-06-27 ENCOUNTER — Encounter (HOSPITAL_BASED_OUTPATIENT_CLINIC_OR_DEPARTMENT_OTHER): Payer: Medicare Other | Admitting: Oncology

## 2010-06-27 DIAGNOSIS — D6959 Other secondary thrombocytopenia: Secondary | ICD-10-CM

## 2010-06-27 DIAGNOSIS — R809 Proteinuria, unspecified: Secondary | ICD-10-CM

## 2010-06-27 DIAGNOSIS — E538 Deficiency of other specified B group vitamins: Secondary | ICD-10-CM

## 2010-06-27 DIAGNOSIS — T451X5A Adverse effect of antineoplastic and immunosuppressive drugs, initial encounter: Secondary | ICD-10-CM

## 2010-06-27 DIAGNOSIS — C712 Malignant neoplasm of temporal lobe: Secondary | ICD-10-CM

## 2010-06-27 LAB — CBC WITH DIFFERENTIAL/PLATELET
Basophils Absolute: 0 10*3/uL (ref 0.0–0.1)
Eosinophils Absolute: 0.1 10*3/uL (ref 0.0–0.5)
HCT: 25.7 % — ABNORMAL LOW (ref 38.4–49.9)
HGB: 8.4 g/dL — ABNORMAL LOW (ref 13.0–17.1)
MCH: 33.7 pg — ABNORMAL HIGH (ref 27.2–33.4)
MCV: 103.2 fL — ABNORMAL HIGH (ref 79.3–98.0)
MONO%: 8.7 % (ref 0.0–14.0)
NEUT#: 1.4 10*3/uL — ABNORMAL LOW (ref 1.5–6.5)
NEUT%: 50.2 % (ref 39.0–75.0)
Platelets: 45 10*3/uL — ABNORMAL LOW (ref 140–400)
RDW: 20.4 % — ABNORMAL HIGH (ref 11.0–14.6)

## 2010-06-27 LAB — UA PROTEIN, DIPSTICK - CHCC: Protein, Urine: <30 mg/dL

## 2010-06-27 LAB — COMPREHENSIVE METABOLIC PANEL
ALT: 36 U/L (ref 0–53)
CO2: 21 mEq/L (ref 19–32)
Calcium: 8.8 mg/dL (ref 8.4–10.5)
Chloride: 106 mEq/L (ref 96–112)
Creatinine, Ser: 1.37 mg/dL (ref 0.40–1.50)
Glucose, Bld: 147 mg/dL — ABNORMAL HIGH (ref 70–99)
Total Protein: 5.9 g/dL — ABNORMAL LOW (ref 6.0–8.3)

## 2010-07-03 ENCOUNTER — Other Ambulatory Visit: Payer: Self-pay | Admitting: Oncology

## 2010-07-03 ENCOUNTER — Encounter (HOSPITAL_BASED_OUTPATIENT_CLINIC_OR_DEPARTMENT_OTHER): Payer: Medicare Other | Admitting: Oncology

## 2010-07-03 DIAGNOSIS — E039 Hypothyroidism, unspecified: Secondary | ICD-10-CM

## 2010-07-03 DIAGNOSIS — R29898 Other symptoms and signs involving the musculoskeletal system: Secondary | ICD-10-CM

## 2010-07-03 DIAGNOSIS — Z1389 Encounter for screening for other disorder: Secondary | ICD-10-CM

## 2010-07-03 DIAGNOSIS — E538 Deficiency of other specified B group vitamins: Secondary | ICD-10-CM

## 2010-07-03 DIAGNOSIS — D6959 Other secondary thrombocytopenia: Secondary | ICD-10-CM

## 2010-07-03 DIAGNOSIS — C712 Malignant neoplasm of temporal lobe: Secondary | ICD-10-CM

## 2010-07-03 LAB — CBC WITH DIFFERENTIAL/PLATELET
BASO%: 0.3 % (ref 0.0–2.0)
EOS%: 2.3 % (ref 0.0–7.0)
HGB: 8.7 g/dL — ABNORMAL LOW (ref 13.0–17.1)
MCH: 36.6 pg — ABNORMAL HIGH (ref 27.2–33.4)
MCHC: 35.1 g/dL (ref 32.0–36.0)
RDW: 25.3 % — ABNORMAL HIGH (ref 11.0–14.6)
WBC: 2.6 10*3/uL — ABNORMAL LOW (ref 4.0–10.3)
lymph#: 1 10*3/uL (ref 0.9–3.3)

## 2010-07-03 LAB — TSH: TSH: 1.596 u[IU]/mL (ref 0.350–4.500)

## 2010-07-03 LAB — UA PROTEIN, DIPSTICK - CHCC: Protein, Urine: 100 mg/dL

## 2010-07-03 NOTE — Op Note (Signed)
[name], [name]                ACCOUNT NO.:  [id number]   MEDICAL RECORD NO.:  [id number]          PATIENT TYPE:  INP   LOCATION:  3108                         FACILITY:  [facility]   PHYSICIAN:  Otilio Connors, M.D.  DATE OF BIRTH:  September 28, 1960   DATE OF PROCEDURE:  12/07/2007  DATE OF DISCHARGE:                               OPERATIVE REPORT   PREOPERATIVE DIAGNOSIS:  Left temporal tumor.   POSTOPERATIVE DIAGNOSES:  1. Left temporal tumor.  2. High-grade glioma.   PROCEDURE:  Stealth frameless stereotactic left temporal lobe craniotomy  for tumor resection, microdissection with microscope, and Gliadel wafer  replacement.   SURGEON:  Otilio Connors, MD.   ASSISTANT:  Hosie Spangle, MD.   ANESTHESIA:  General endotracheal tube anesthesia.   ESTIMATED BLOOD LOSS:  Minimal.   BLOOD GIVEN:  None.   DRAINS:  None.   COMPLICATIONS:  None.   REASON FOR PROCEDURE:  The patient is a 50 year old gentleman who had a  seizure once worked up who was placed on [place].  No further seizures  workup with an MRI showed re-enhancing lesion on left temporal lobe.  The patient was brought in for craniotomy and resection of tumor.   PROCEDURE IN DETAIL:  The patient was brought to the operating room and  general anesthesia was induced.  The patient was placed in [place]  pins, and then the Stealth frameless stereotactic systems was then set  up. We used the protocol to merge the MRI pictures on the computer with  the patient's head and we were able to validate points afterwards  showing __________ resolution.  The patient then prepped and draped in  the sterile fashion.  The site of incision was injected with 20 mL of 1%  lidocaine with epinephrine.  We again used the Stealth system to plan a  craniotomy.  Linear incision was made on the left temporal lobe lesion.  The incision was taken down to temporalis fascia to the skull and  reflect the temporalis muscle with a self-retaining  retractor.  We went  down to the ridge of  zygoma and then using the Stealth, we assured that  we had exposure over the temporal bone overlying the entire tumor.  Trudee Kuster  hole was made with the high speed drill.  Craniotome was used to elevate  the small bone flap and craniectomy was taken down in the more inferior  temporal region to take it down to the skull base.  Dura was opened in a  cruciate fashion and immediately noted the large vein between the  inferior and middle temporal gyrus and the inferior temporal gyrus was  discolored.  We did a corticotomy into the inferior temporal gyrus and  within a few millimeters were down to abnormal yellowish-gray tissue and  then as we started taking this for biopsy, fluid, a little morenecrotic  fluid was obtained and we suctioned that out.  We took out more tumor  with biopsy forceps and got down to __________ pathology and continued  to have tumor resection with the pituitary and resection of bipolars.  Continued with tumor resections until we had what appeared to be a gross  total resection and using the Stealth, it also showed that we had  resected the area where the tumor was.  No further abnormal tissue could  be seen.  He got hemostasis with bipolar cauterization, Gelfoam and  Thrombin and  Surgicel.  We sent the rest of  the path down to the  pathologist  and we got the results of the frozen showing this as a high-  grade glioma probably GBM.  Gliadel wafers were then obtained, thawed  out and when we have good hemostasis, a Gliadel wafers were replaced in  the tumor bed.  We only could use 2 wafers in the small area that we  were in.  Dura was closed with 4-0 Nurolons.  DuraGen was placed over  the Dura.  A small bone flap was placed back, held in place with the  micro plating system.  Retractor was removed.  Temporalis fascia was  closed with a 2-0 Vicryl interrupted suture.  Subcutaneous stitch was  closed with 2-0 Vicryl interrupted  sutures.  Skin was closed with  staples.  Dressing was placed.  The patient was removed from head pins,  awoken from anesthesia, and transferred to the recovery room in stable  but fair condition.           ______________________________  Otilio Connors, M.D.     JRH/MEDQ  D:  12/07/2007  T:  12/08/2007  Job:  [id number]

## 2010-07-03 NOTE — Discharge Summary (Signed)
NAMEEWART, CARRERA                ACCOUNT NO.:  [id number]   MEDICAL RECORD NO.:  [id number]          PATIENT TYPE:  INP   LOCATION:  3108                         FACILITY:  [facility]   PHYSICIAN:  Otilio Connors, M.D.  DATE OF BIRTH:  04/06/1960   DATE OF ADMISSION:  12/07/2007  DATE OF DISCHARGE:  12/09/2007                               DISCHARGE SUMMARY   DIAGNOSIS:  High-grade glioma.   DISCHARGE DIAGNOSIS:  High-grade glioma.   PROCEDURE:  Left temporal lobe craniotomy for tumor resection with  Stealth stereotactic device, microsection, and  Gliadel wafers.   REASON FOR ADMISSION:  The patient is a 50 year old gentleman who had a  single seizure and started on Keppra, no further seizures.  Workup  showed ring-enhancing lesion, left temporal lobe with some edema, and  the patient had a repeat MRI with Stealth protocol and admitted for  craniotomy and tumor resection.   HOSPITAL COURSE:  The patient admitted on the day of surgery and  underwent procedure as above without complications.  Frozen path was  high-grade glioma suggestive of a GBM.  The patient was allowed to move  from the ICU and he is neurologically intact postop and remained that  way throughout his hospital course with minimal pain.  He was able to  eat.  He should continue with his Keppra and IV steroids.  Repeat MRI of  the brain was done showing gross total resection of the tumor was done.  At the time of surgery, Gliadel wafers were put in because of the report  of a high-grade glioma.  The patient remains neurologically intact.  Discharged home in stable condition.   DISCHARGE MEDICATIONS:  Same as prehospitalization including the Keppra  plus Decadron to wean and start of with just 4 mg q.6 h. and over the  next week wean down to 2 mg q.8 h. and stay on that dose.  At this  point, Vicodin p.r.n. and over-the-counter Pepcid or Zantac daily.   Followup will be in 12-14 days for staple removal in my office  and with  Oncology.  Oncology consultation was done, and they plan on to follow  him as an outpatient and once we get final path most likely starting  radiation therapy and chemotherapy.  The patient has done extremely  well.           ______________________________  Otilio Connors, M.D.     JRH/MEDQ  D:  12/09/2007  T:  12/09/2007  Job:  [id number]   cc:   [facility]

## 2010-07-03 NOTE — Consult Note (Signed)
[name], [name]                ACCOUNT NO.:  [id number]   MEDICAL RECORD NO.:  [id number]          Thomas TYPE:  INP   LOCATION:  3108                         FACILITY:  [facility]   PHYSICIAN:  Mathis Dad. Shadad        DATE OF BIRTH:  1960/12/08   DATE OF CONSULTATION:  12/08/2007  DATE OF DISCHARGE:  12/09/2007                                 CONSULTATION   REASON FOR CONSULTATION:  Brain lesion.   REFERRING PHYSICIAN:  Otilio Connors, MD   HISTORY OF PRESENT ILLNESS:  [name] is a pleasant 50 year old man,  who was overall in good state of health until November 20, 2007, when he  experienced a generalized tonic-clonic seizure, for which he was  transferred to Bruce emergency department.  A CT scan of Bruce head with no  contrast on Bruce same day was negative for abnormalities.  He did have a  neuro evaluation by Dr. Gaynell Face, who due to [name]'s complaints of  headaches, dysphagia, and slurred speech, ordered an MRI of Bruce brain.  This was performed with contrast revealing an enhancing lesion in Bruce  left anterior temporal lobe of 1.5 x 3 cm, with some surrounding edema,  no other lesions were seen.  He was evaluated by Dr. Luiz Ochoa, who  recommended tumor resection.  Prior to that surgery, he had another MRI  of Bruce brain showing Bruce mass to be 24 x 12 x 15 mm with mild  surrounding edema suspicious for glioblastoma multiforme with no other  masses seen.  He underwent tumor resection on December 07, 2007, with Bruce  formal pathology pending at this time.   PAST MEDICAL HISTORY:  Remarkable for hypertension.   SURGERIES:  Status post left temporal craniotomy for tumor resection,  Gliadel wafers December 07, 2007, Dr. Luiz Ochoa.   ALLERGIES:  NKDA.   MEDICATIONS:  1. Ancef 1 g q.8 h.  2. Decadron 6 mg q.6 h., now tapering to 4 mg q.6 h.  3. Colace b.i.d.  4. Keppra 500 mg q.12 h.  5. Tylenol p.r.n.  6. Norco 1.523 tabs q.4 h. p.r.n.  7. Morphine sulfate 1-4 mg q.2 h. p.r.n.  8. Zofran 4 mg q.6 h. p.r.n.  9. Senokot p.r.n.   REVIEW OF SYSTEMS:  See HPI for significant positives.  He also admits  to excessive cell phone use since 1980s, and exposure to mercury in [employer] in [place].  [name] had experienced difficulty with  finding words over Bruce last month.  Bruce rest of Bruce review of systems is  negative.   FAMILY HISTORY:  Mother alive, she is a Thomas of Dr. Marin Olp, who is  taking care of her regarding oncological issues.  Father alive and well.  He is an only child.   SOCIAL HISTORY:  [name] is married.  He has 2 young children.  No  tobacco or alcohol history.  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  Lives in [place].   PHYSICAL EXAMINATION:  GENERAL:  This is a well-developed, well-  nourished 50 year old white  male in no acute distress, alert and  oriented x3.  VITAL SIGNS:  Stable, afebrile.  Height 5-8.  HEENT:  Remarkable for left bandage at Bruce area of resection, at Bruce  temporal region, with minimal drainage and inflammation, no significant  erythema.  It is healing well.  PERRLA.  Oral cavity without lesions or  thrush.  NECK:  Supple.  No cervical or supraclavicular masses.  LUNGS:  Clear to auscultation bilaterally with no wheezing, rhonchi, or  rales.  No axillary masses.  CARDIOVASCULAR:  Regular rate and rhythm  with no murmurs, rubs, or gallops.  ABDOMEN:  Soft, nontender.  Bowel sounds x4.  No hepatosplenomegaly.  EXTREMITIES:  No clubbing or cyanosis.  No edema.  No inguinal masses.  SKIN:  Without bruising or petechial rash.  GU AND RECTAL:  Deferred.  MUSCULOSKELETAL:  No spinal tenderness.  NEUROLOGIC:  Improving his neuro status after surgery, [name]  states that his level of strength has increased.  Strength tested is 4/5  in Bruce right arm and 5/5 in Bruce left lower extremities, 5/5 strength in  both sides.  Sensation intact.  [name] follows commands  appropriately.  Speech today  is essentially unremarkable.   LABORATORY DATA:  Hemoglobin 14, hematocrit 42.3, white count 5.8,  platelets 255, MCV 93.3.  PTT 28, PT 12.9, INR 1.0.  Sodium 140,  potassium 4.3, BUN 13, creatinine 0.87, glucose 102, total bilirubin  0.7, alkaline phosphatase 59, AST 32, ALT 35, total protein 7.0, albumin  4.1, calcium 9.4, magnesium 2.4.  Urinalysis negative.  Alcohol  negative.  Urine drug screen negative.   ASSESSMENT AND PLAN:  Dr. Alen Blew has seen and evaluated [name] and  reviewed Bruce chart.  This is a 50 year old man found to have a brain  tumor in Bruce left temporal area after experiencing a generalized tonic-  clonic seizure.  He is status post surgical resection of Bruce tumor, with  Bruce preliminary pathology showing high-grade glioma, but Bruce final  pathology currently pending.  If [name] had glioblastoma  multiforme, he will need adjuvant radiation and Temodar upon discharge  from Bruce hospital.  We will arrange followup for [name] at [facility] upon discharge.   Thank you very much for allowing [place] Bruce opportunity to participate in  Bruce care of this nice Thomas.      Sharene Butters, P.A.      Mathis Dad. [facility]  Electronically Signed    SW/MEDQ  D:  12/10/2007  T:  12/10/2007  Job:  [id number]   cc:   Marletta Lor, MD

## 2010-07-06 NOTE — H&P (Signed)
[name], [name]                          ACCOUNT NO.:  [id number]   MEDICAL RECORD NO.:  [id number]                   PATIENT TYPE:  INP   LOCATION:  5502                                 FACILITY:  [facility]   PHYSICIAN:  Dyke Maes, M.D.          DATE OF BIRTH:  Mar 19, 1960   DATE OF ADMISSION:  07/19/2003  DATE OF DISCHARGE:                                HISTORY & PHYSICAL   REASON FOR ADMISSION:  Acute renal failure.   HISTORY OF PRESENT ILLNESS:  This is a 50 year old white male who was  admitted because of acute renal failure.  His history begins approximately  two weeks ago on May 20 when he was at [facility] partying and drinking a  lot of alcohol.  He has suchi on the evening of May 20.  The following day,  he ate a [employer] sandwich, and then that afternoon developed severe  Diarrhea along with myalgias.  This continued for at least the next 48  hours.  He saw Dr. Alessandra Bevels on May 23, and was empirically placed on Cipro.  He also began taking Imodium.  His diarrhea did stop, but he complained of  feeling bloated.  Also during this time, he had orthostatic-type symptoms of  feeling dizzy every time he stood up.  On May 27, he had laboratory done  which revealed a serum creatinine of 3.6, and was thus sent to [facility]  emergency room.  Repeat labs there showed a serum creatinine of 3.4 an  entirely benign urinalysis and a CT of his abdomen and pelvis which was  negative.  He was given IV fluids during this emergency room stay, and also  given steroids.  CT scan did mention some mild perinephric edema of unclear  etiology, and this must have been the reason why he was placed on steroids.  Since that time, he has felt much better.  He has had good urine output.  His bloating has resolved, and he is eating and drinking without problems.  Also of note is the fact that since April of 2005 when he had laboratory  done which revealed a serum creatinine of 1.1, he has  been placed on  numerous supplements which include Omega Pure 600 EC fish oil,  dehydroepiandrosterone, Inderal, iodoral, iodine and potassium supplement,  magnesium supplement, vitamin A supplement, taurine, Zinc supplement and  serenx which is a [place] herbal stress remedy.   PAST MEDICAL HISTORY:  Unremarkable.   ALLERGIES:  None.   MEDICATIONS:  1. Cipro 500 mg b.i.d.  2. Prednisone 80 mg a day.  3. Supplements as noted above.   SOCIAL HISTORY:  He is a nonsmoker.  He is an occasional drinker.  He is a  Armed forces logistics/support/administrative officer.  He is married and has two kids.   FAMILY HISTORY:  Negative for renal disease.   REVIEW OF SYSTEMS:  Appetite is good.  No recent change in vision.  No chest  pains.  No shortness of breath.  No arthralgias.  Myalgias have resolved.  GI symptoms as noted above.  No dysuria.   PHYSICAL EXAMINATION:  VITAL SIGNS:  Blood pressure seated is 127/79, pulse  63.  Standing blood pressure 120/91.  Pulse 66.  Temperature 98.5.  GENERAL:  A healthy-appearing 50 year old white male, in no acute distress.  HEENT:  Sclerae are nonicteric.  Extraocular muscles are intact.  NECK:  Reveals no lymphadenopathy.  SKIN:  Reveals no rashes.  LUNGS:  Clear to auscultation.  HEART:  Regular rate and rhythm without murmur, rub or gallop.  ABDOMEN:  Positive bowel sounds, nontender, nondistended, no  hepatosplenomegaly.  GENITOURINARY:  Reveals bilaterally distended testicles.  No hernia.  EXTREMITIES:  No clubbing, cyanosis or edema.  NEUROLOGIC:  Cranial nerves intact.  Motor and sensory intact.  No  asterixis.   IMPRESSION:  Acute renal failure.  His urinalysis was benign.  All of this  is proceeded by a history of what sounds like volume depletion.  I suspect  he was either prerenal or had a mild case of ATN as the most likely cause  for his acute renal failure.  Certainly, Cipro can cause an acute, allergic  interstitial nephritis though he was on this for only a couple  of days prior  to his serum creatinine being elevated, and his urinalysis showed no pyuria.  [place] herbs can also cause acute renal failure, but again, this is  typically not associated with benign urinalysis.   PLAN:  1. We will recheck his CMET, CBC and urinalysis tonight, and decide where to     go from there.  2. We will stop all of his supplements.  3. We will discontinue the prednisone, discontinue the Cipro for now.                                                Dyke Maes, M.D.    MTM/MEDQ  D:  07/19/2003  T:  07/19/2003  Job:  [id number]

## 2010-07-11 ENCOUNTER — Encounter (HOSPITAL_BASED_OUTPATIENT_CLINIC_OR_DEPARTMENT_OTHER): Payer: Medicare Other | Admitting: Oncology

## 2010-07-11 ENCOUNTER — Other Ambulatory Visit: Payer: Self-pay | Admitting: Oncology

## 2010-07-11 DIAGNOSIS — D61818 Other pancytopenia: Secondary | ICD-10-CM

## 2010-07-11 DIAGNOSIS — E039 Hypothyroidism, unspecified: Secondary | ICD-10-CM

## 2010-07-11 DIAGNOSIS — C712 Malignant neoplasm of temporal lobe: Secondary | ICD-10-CM

## 2010-07-11 DIAGNOSIS — Z5112 Encounter for antineoplastic immunotherapy: Secondary | ICD-10-CM

## 2010-07-11 DIAGNOSIS — G8190 Hemiplegia, unspecified affecting unspecified side: Secondary | ICD-10-CM

## 2010-07-11 DIAGNOSIS — E538 Deficiency of other specified B group vitamins: Secondary | ICD-10-CM

## 2010-07-11 LAB — CBC WITH DIFFERENTIAL/PLATELET
BASO%: 0.3 % (ref 0.0–2.0)
EOS%: 1.6 % (ref 0.0–7.0)
MCH: 35.4 pg — ABNORMAL HIGH (ref 27.2–33.4)
MCHC: 33.5 g/dL (ref 32.0–36.0)
MONO#: 0.5 10*3/uL (ref 0.1–0.9)
RBC: 2.29 10*6/uL — ABNORMAL LOW (ref 4.20–5.82)
RDW: 22.1 % — ABNORMAL HIGH (ref 11.0–14.6)
WBC: 3.1 10*3/uL — ABNORMAL LOW (ref 4.0–10.3)
lymph#: 1.2 10*3/uL (ref 0.9–3.3)
nRBC: 0 % (ref 0–0)

## 2010-07-18 NOTE — Discharge Summary (Signed)
NAMECHICK, COUSINS                ACCOUNT NO.:  [id number]  MEDICAL RECORD NO.:  [id number]           PATIENT TYPE:  I  LOCATION:  1017                         FACILITY:  [facility]  PHYSICIAN:  Helayne Seminole, PA-C      DATE OF BIRTH:  [birth year]  DATE OF ADMISSION:  05/10/2010 DATE OF DISCHARGE:                              DISCHARGE SUMMARY   ONCOLOGIST:  Ma Rings, M.D.  DISCHARGE DIAGNOSES: 1. Confusion, anger/aggressive behavior, likely related to the     Decadron, glioblastoma, poor psychosis, this is resolved.  He is     currently on Klonopin. 2. Left brain glioblastoma multiforme, he is currently maintained off     specific therapy. 3. Thrombocytopenia, etiology unclear, status post bone marrow biopsy     on March 8 with cytogenetics pending.  CONDITION ON DISCHARGE:  Stable.  DISCHARGE INSTRUCTIONS:  ACTIVITIES:  No limitations.  DIET:  No restrictions.  FOLLOWUP:  The patient will follow back up with Dr. Benay Spice as scheduled.  DISCHARGE MEDICATIONS: 1. Lasix 40 mg by mouth 1 tablet daily. 2. K-Dur 20 mEq 1 tablet by mouth daily. 3. Klonopin 0.5 mg by mouth b.i.d. 4. Benicar 40 mg 1 tablet by mouth daily. 5. Clonidine 0.1 mg 1 tablet by mouth b.i.d. 6. Keppra 750 mg 1 tablet by mouth b.i.d. 7. Levothyroxine 15 mcg 1 tablet by mouth daily. 8. Norvasc 2 mg 1 tablet by mouth every morning. 9. Protonix 4 mg 1 tablet by mouth daily. 10.Tylenol 325 mg 2 tablets by mouth every 6 hours as needed for pain. 11.Vitamin B12 over-the-counter 1 tablet by mouth every morning. 12.Zoloft 50 mg 1 tablet by mouth daily at bedtime  HOSPITAL COURSE:  This is a pleasant 50 year old gentleman with a glioblastoma multiforme who was admitted with increased confusion/agitation and violent behavior.  This is an unfortunate gentleman who was initially diagnosed with glioblastoma multiforme in October 2009.  He underwent initial surgical resection with placement of Gliadel  wafers.  He has subsequently been treated for radiation and multiple systemic therapies.  He is currently maintained off his systemic therapy for glioblastoma.  He was found to have disease progression back in February of 2012.  He has been unable to resume treatment due to thrombocytopenia.  Our office was telephoned earlier this past Thursday by his family with report of violent behavior. Apparently, he had been yelling at multiple family members including his wife, daughter and mother.  He ended up driving his car to his work office Thursday afternoon against the advice of his family and physicians.  Apparently, his family located him and brought him to the [facility] for evaluation.  Upon clinical exam, Thursday afternoon, he did knowledge his aggressive behavior.  Due to this, his family members did not feel that it was safe for him to be at home and he was admitted for psych evaluation as well as observation.  Mr. Chabot was placed on Klonopin by the recommendation of Dr. Sherlynn Stalls at 0.5 mg twice a day, which seemed to significantly help his behavior.  It was decided not to start Mr. Riordan on Depakote  because of the thrombocytopenia as well as left brain glioblastoma and it was decided not to start him on Risperdal as well.  Over the course of his hospital stay, he did become less aggressive/angry and reports that he was frustrated.  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  He does have very good support system and understands that there are different ways to help with his psychological well being.  I personally spoke with his wife today, who felt safe for Mr. Kolbe to be discharged from the hospital and return home today.  I did advise her that if there should be any concerning behavior, she can feel free to call the  on-call physician or if there should be an emergency, to call 911.  Mr. Hashimi reports that he did not wish to harm himself or others and does not have any type of suicidal ideation.  DISCHARGE EXAMINATION:  GENERAL:  This is a pleasant, alert and oriented white gentleman in no obvious distress. VITAL SIGNS:  Temperature 98.9 degrees, pulse 89, respirations 20, blood pressure is 110/80. HEENT:  There is a normocephalic, atraumatic skull.  There are no oropharyngeal lesions. NECK:  Supple.  Lymph nodes exam is negative for any cervical, supraclavicular, axillary adenopathy. LUNGS:  Clear bilaterally. CARDIAC:  Regular rate and rhythm with normal S1 and S2.  There are no murmurs, rubs or bruits. ABDOMEN:  Good bowel sounds.  There is no palpable abdominal mass. There is no palpable hepatosplenomegaly. MUSCULOSKELETAL:  There is no tenderness of the spine or hip. EXTREMITIES:  No clubbing, cyanosis or edema. NEUROLOGIC:  He does have a slight right facial droop as well as the right visual field deficit.  DISCHARGE LABORATORY DATA:  Discharge labs revealed a sodium of 140, potassium 4.2, BUN 27, creatinine 1.19, calcium 7.6.  CBC revealed white count of 3.7, hemoglobin 10.9, hematocrit 33.8 and platelets of 42,000. His TSH back on March 22 was normal at 0.814.  Magnesium was also normal at 2.4.     Helayne Seminole, PA-C     TS/MEDQ  D:  05/12/2010  T:  05/12/2010  Job:  [id number]  Electronically Signed by Helayne Seminole  on 07/18/2010 10:53:06 AM

## 2010-07-23 ENCOUNTER — Other Ambulatory Visit: Payer: Self-pay | Admitting: Oncology

## 2010-07-23 ENCOUNTER — Encounter (HOSPITAL_BASED_OUTPATIENT_CLINIC_OR_DEPARTMENT_OTHER): Payer: Medicare Other | Admitting: Oncology

## 2010-07-23 DIAGNOSIS — D702 Other drug-induced agranulocytosis: Secondary | ICD-10-CM

## 2010-07-23 DIAGNOSIS — E538 Deficiency of other specified B group vitamins: Secondary | ICD-10-CM

## 2010-07-23 DIAGNOSIS — D6959 Other secondary thrombocytopenia: Secondary | ICD-10-CM

## 2010-07-23 DIAGNOSIS — Z5112 Encounter for antineoplastic immunotherapy: Secondary | ICD-10-CM

## 2010-07-23 DIAGNOSIS — R5381 Other malaise: Secondary | ICD-10-CM

## 2010-07-23 DIAGNOSIS — C712 Malignant neoplasm of temporal lobe: Secondary | ICD-10-CM

## 2010-07-23 DIAGNOSIS — E039 Hypothyroidism, unspecified: Secondary | ICD-10-CM

## 2010-07-23 LAB — CBC WITH DIFFERENTIAL/PLATELET
Basophils Absolute: 0 10*3/uL (ref 0.0–0.1)
EOS%: 0.5 % (ref 0.0–7.0)
Eosinophils Absolute: 0 10*3/uL (ref 0.0–0.5)
HGB: 8.8 g/dL — ABNORMAL LOW (ref 13.0–17.1)
MCV: 107.6 fL — ABNORMAL HIGH (ref 79.3–98.0)
MONO%: 11.1 % (ref 0.0–14.0)
NEUT#: 2 10*3/uL (ref 1.5–6.5)
RBC: 2.49 10*6/uL — ABNORMAL LOW (ref 4.20–5.82)
RDW: 19.3 % — ABNORMAL HIGH (ref 11.0–14.6)
lymph#: 1.2 10*3/uL (ref 0.9–3.3)

## 2010-07-31 ENCOUNTER — Ambulatory Visit: Payer: Medicare Other | Attending: Oncology | Admitting: Physical Therapy

## 2010-07-31 ENCOUNTER — Other Ambulatory Visit: Payer: Self-pay | Admitting: Oncology

## 2010-07-31 ENCOUNTER — Encounter (HOSPITAL_BASED_OUTPATIENT_CLINIC_OR_DEPARTMENT_OTHER): Payer: Medicare Other | Admitting: Oncology

## 2010-07-31 DIAGNOSIS — R5381 Other malaise: Secondary | ICD-10-CM | POA: Insufficient documentation

## 2010-07-31 DIAGNOSIS — E538 Deficiency of other specified B group vitamins: Secondary | ICD-10-CM

## 2010-07-31 DIAGNOSIS — G8190 Hemiplegia, unspecified affecting unspecified side: Secondary | ICD-10-CM

## 2010-07-31 DIAGNOSIS — IMO0001 Reserved for inherently not codable concepts without codable children: Secondary | ICD-10-CM | POA: Insufficient documentation

## 2010-07-31 DIAGNOSIS — C712 Malignant neoplasm of temporal lobe: Secondary | ICD-10-CM

## 2010-07-31 DIAGNOSIS — M6281 Muscle weakness (generalized): Secondary | ICD-10-CM | POA: Insufficient documentation

## 2010-07-31 DIAGNOSIS — D61818 Other pancytopenia: Secondary | ICD-10-CM

## 2010-07-31 DIAGNOSIS — R269 Unspecified abnormalities of gait and mobility: Secondary | ICD-10-CM | POA: Insufficient documentation

## 2010-07-31 LAB — CBC WITH DIFFERENTIAL/PLATELET
Basophils Absolute: 0 10*3/uL (ref 0.0–0.1)
Eosinophils Absolute: 0.1 10*3/uL (ref 0.0–0.5)
HGB: 10.2 g/dL — ABNORMAL LOW (ref 13.0–17.1)
MCV: 107.2 fL — ABNORMAL HIGH (ref 79.3–98.0)
MONO#: 0.7 10*3/uL (ref 0.1–0.9)
NEUT#: 3.9 10*3/uL (ref 1.5–6.5)
Platelets: 98 10*3/uL — ABNORMAL LOW (ref 140–400)
RBC: 2.91 10*6/uL — ABNORMAL LOW (ref 4.20–5.82)
RDW: 17.1 % — ABNORMAL HIGH (ref 11.0–14.6)
WBC: 6.1 10*3/uL (ref 4.0–10.3)
nRBC: 0 % (ref 0–0)

## 2010-08-02 ENCOUNTER — Ambulatory Visit: Payer: Medicare Other | Admitting: Physical Therapy

## 2010-08-03 ENCOUNTER — Ambulatory Visit: Payer: Medicare Other | Admitting: Physical Therapy

## 2010-08-06 ENCOUNTER — Ambulatory Visit: Payer: Medicare Other | Admitting: Occupational Therapy

## 2010-08-06 ENCOUNTER — Other Ambulatory Visit: Payer: Self-pay | Admitting: Oncology

## 2010-08-06 ENCOUNTER — Encounter (HOSPITAL_BASED_OUTPATIENT_CLINIC_OR_DEPARTMENT_OTHER): Payer: Medicare Other | Admitting: Oncology

## 2010-08-06 DIAGNOSIS — D702 Other drug-induced agranulocytosis: Secondary | ICD-10-CM

## 2010-08-06 DIAGNOSIS — D6959 Other secondary thrombocytopenia: Secondary | ICD-10-CM

## 2010-08-06 DIAGNOSIS — N19 Unspecified kidney failure: Secondary | ICD-10-CM

## 2010-08-06 DIAGNOSIS — C712 Malignant neoplasm of temporal lobe: Secondary | ICD-10-CM

## 2010-08-06 DIAGNOSIS — D61818 Other pancytopenia: Secondary | ICD-10-CM

## 2010-08-06 DIAGNOSIS — G8190 Hemiplegia, unspecified affecting unspecified side: Secondary | ICD-10-CM

## 2010-08-06 LAB — CBC WITH DIFFERENTIAL/PLATELET
BASO%: 0.3 % (ref 0.0–2.0)
Basophils Absolute: 0 10*3/uL (ref 0.0–0.1)
Eosinophils Absolute: 0.1 10*3/uL (ref 0.0–0.5)
HCT: 28.8 % — ABNORMAL LOW (ref 38.4–49.9)
HGB: 9.6 g/dL — ABNORMAL LOW (ref 13.0–17.1)
LYMPH%: 26 % (ref 14.0–49.0)
MONO#: 0.6 10*3/uL (ref 0.1–0.9)
NEUT%: 59.5 % (ref 39.0–75.0)
Platelets: 123 10*3/uL — ABNORMAL LOW (ref 140–400)
WBC: 4.9 10*3/uL (ref 4.0–10.3)
lymph#: 1.3 10*3/uL (ref 0.9–3.3)

## 2010-08-07 ENCOUNTER — Ambulatory Visit: Payer: Medicare Other | Admitting: Physical Therapy

## 2010-08-07 ENCOUNTER — Ambulatory Visit: Payer: Medicare Other | Admitting: Occupational Therapy

## 2010-08-13 ENCOUNTER — Ambulatory Visit: Payer: Medicare Other | Admitting: Occupational Therapy

## 2010-08-13 ENCOUNTER — Ambulatory Visit: Payer: Medicare Other | Admitting: Physical Therapy

## 2010-08-15 ENCOUNTER — Ambulatory Visit: Payer: Medicare Other | Admitting: Occupational Therapy

## 2010-08-15 ENCOUNTER — Ambulatory Visit: Payer: Medicare Other | Admitting: Physical Therapy

## 2010-08-17 ENCOUNTER — Ambulatory Visit: Payer: Medicare Other | Admitting: Physical Therapy

## 2010-08-20 ENCOUNTER — Ambulatory Visit: Payer: Medicare Other | Attending: Oncology | Admitting: Physical Therapy

## 2010-08-20 ENCOUNTER — Ambulatory Visit: Payer: Medicare Other | Admitting: Occupational Therapy

## 2010-08-20 DIAGNOSIS — IMO0001 Reserved for inherently not codable concepts without codable children: Secondary | ICD-10-CM | POA: Insufficient documentation

## 2010-08-20 DIAGNOSIS — R5381 Other malaise: Secondary | ICD-10-CM | POA: Insufficient documentation

## 2010-08-20 DIAGNOSIS — M6281 Muscle weakness (generalized): Secondary | ICD-10-CM | POA: Insufficient documentation

## 2010-08-20 DIAGNOSIS — R269 Unspecified abnormalities of gait and mobility: Secondary | ICD-10-CM | POA: Insufficient documentation

## 2010-08-21 ENCOUNTER — Encounter: Payer: Medicare Other | Admitting: Occupational Therapy

## 2010-08-23 ENCOUNTER — Ambulatory Visit: Payer: Medicare Other | Admitting: Physical Therapy

## 2010-08-23 ENCOUNTER — Other Ambulatory Visit: Payer: Self-pay | Admitting: Oncology

## 2010-08-23 ENCOUNTER — Encounter (HOSPITAL_BASED_OUTPATIENT_CLINIC_OR_DEPARTMENT_OTHER): Payer: Medicare Other | Admitting: Oncology

## 2010-08-23 DIAGNOSIS — E538 Deficiency of other specified B group vitamins: Secondary | ICD-10-CM

## 2010-08-23 DIAGNOSIS — D61818 Other pancytopenia: Secondary | ICD-10-CM

## 2010-08-23 DIAGNOSIS — C712 Malignant neoplasm of temporal lobe: Secondary | ICD-10-CM

## 2010-08-23 DIAGNOSIS — G8190 Hemiplegia, unspecified affecting unspecified side: Secondary | ICD-10-CM

## 2010-08-23 LAB — CBC WITH DIFFERENTIAL/PLATELET
BASO%: 0.6 % (ref 0.0–2.0)
EOS%: 2.9 % (ref 0.0–7.0)
Eosinophils Absolute: 0.1 10*3/uL (ref 0.0–0.5)
MCH: 33.8 pg — ABNORMAL HIGH (ref 27.2–33.4)
MCHC: 33.1 g/dL (ref 32.0–36.0)
MCV: 101.9 fL — ABNORMAL HIGH (ref 79.3–98.0)
MONO%: 13.4 % (ref 0.0–14.0)
NEUT#: 1.8 10*3/uL (ref 1.5–6.5)
RBC: 3.08 10*6/uL — ABNORMAL LOW (ref 4.20–5.82)
RDW: 13.9 % (ref 11.0–14.6)

## 2010-08-24 ENCOUNTER — Ambulatory Visit: Payer: Medicare Other | Admitting: Occupational Therapy

## 2010-08-24 ENCOUNTER — Ambulatory Visit: Payer: Medicare Other | Admitting: Physical Therapy

## 2010-08-27 ENCOUNTER — Ambulatory Visit: Payer: Medicare Other | Admitting: Physical Therapy

## 2010-08-27 ENCOUNTER — Ambulatory Visit: Payer: Medicare Other | Admitting: Occupational Therapy

## 2010-08-29 ENCOUNTER — Ambulatory Visit: Payer: Medicare Other | Admitting: Physical Therapy

## 2010-08-29 ENCOUNTER — Ambulatory Visit: Payer: Medicare Other | Admitting: Occupational Therapy

## 2010-08-30 ENCOUNTER — Other Ambulatory Visit: Payer: Self-pay | Admitting: Oncology

## 2010-08-30 ENCOUNTER — Encounter (HOSPITAL_BASED_OUTPATIENT_CLINIC_OR_DEPARTMENT_OTHER): Payer: Medicare Other | Admitting: Oncology

## 2010-08-30 DIAGNOSIS — R5383 Other fatigue: Secondary | ICD-10-CM

## 2010-08-30 DIAGNOSIS — C712 Malignant neoplasm of temporal lobe: Secondary | ICD-10-CM

## 2010-08-30 DIAGNOSIS — D702 Other drug-induced agranulocytosis: Secondary | ICD-10-CM

## 2010-08-30 DIAGNOSIS — D6959 Other secondary thrombocytopenia: Secondary | ICD-10-CM

## 2010-08-30 DIAGNOSIS — R5381 Other malaise: Secondary | ICD-10-CM

## 2010-08-30 LAB — CBC WITH DIFFERENTIAL/PLATELET
BASO%: 0.4 % (ref 0.0–2.0)
Eosinophils Absolute: 0.1 10*3/uL (ref 0.0–0.5)
MONO#: 0.5 10*3/uL (ref 0.1–0.9)
NEUT#: 1.2 10*3/uL — ABNORMAL LOW (ref 1.5–6.5)
RBC: 3.11 10*6/uL — ABNORMAL LOW (ref 4.20–5.82)
RDW: 13.7 % (ref 11.0–14.6)
WBC: 2.8 10*3/uL — ABNORMAL LOW (ref 4.0–10.3)
lymph#: 1.1 10*3/uL (ref 0.9–3.3)
nRBC: 0 % (ref 0–0)

## 2010-08-31 ENCOUNTER — Ambulatory Visit: Payer: Medicare Other | Admitting: Physical Therapy

## 2010-08-31 ENCOUNTER — Encounter: Payer: Medicare Other | Admitting: Occupational Therapy

## 2010-09-03 ENCOUNTER — Ambulatory Visit: Payer: Medicare Other | Admitting: Occupational Therapy

## 2010-09-03 ENCOUNTER — Ambulatory Visit: Payer: Medicare Other | Admitting: Physical Therapy

## 2010-09-05 ENCOUNTER — Ambulatory Visit: Payer: Medicare Other | Admitting: Physical Therapy

## 2010-09-07 ENCOUNTER — Ambulatory Visit: Payer: Medicare Other | Admitting: Occupational Therapy

## 2010-09-07 ENCOUNTER — Ambulatory Visit: Payer: Medicare Other | Admitting: Physical Therapy

## 2010-09-11 ENCOUNTER — Encounter: Payer: Medicare Other | Admitting: Occupational Therapy

## 2010-09-12 ENCOUNTER — Ambulatory Visit: Payer: Medicare Other | Admitting: Occupational Therapy

## 2010-09-13 ENCOUNTER — Encounter (HOSPITAL_BASED_OUTPATIENT_CLINIC_OR_DEPARTMENT_OTHER): Payer: Medicare Other | Admitting: Oncology

## 2010-09-13 ENCOUNTER — Other Ambulatory Visit: Payer: Self-pay | Admitting: Oncology

## 2010-09-13 DIAGNOSIS — D702 Other drug-induced agranulocytosis: Secondary | ICD-10-CM

## 2010-09-13 DIAGNOSIS — R5381 Other malaise: Secondary | ICD-10-CM

## 2010-09-13 DIAGNOSIS — D6959 Other secondary thrombocytopenia: Secondary | ICD-10-CM

## 2010-09-13 DIAGNOSIS — R5383 Other fatigue: Secondary | ICD-10-CM

## 2010-09-13 DIAGNOSIS — C712 Malignant neoplasm of temporal lobe: Secondary | ICD-10-CM

## 2010-09-13 LAB — COMPREHENSIVE METABOLIC PANEL
ALT: 18 U/L (ref 0–53)
AST: 17 U/L (ref 0–37)
Albumin: 3.7 g/dL (ref 3.5–5.2)
BUN: 17 mg/dL (ref 6–23)
CO2: 24 mEq/L (ref 19–32)
Calcium: 9.2 mg/dL (ref 8.4–10.5)
Chloride: 110 mEq/L (ref 96–112)
Creatinine, Ser: 0.96 mg/dL (ref 0.50–1.35)
Potassium: 3.6 mEq/L (ref 3.5–5.3)

## 2010-09-13 LAB — CBC WITH DIFFERENTIAL/PLATELET
BASO%: 0.1 % (ref 0.0–2.0)
Basophils Absolute: 0 10*3/uL (ref 0.0–0.1)
HCT: 30.7 % — ABNORMAL LOW (ref 38.4–49.9)
HGB: 10.5 g/dL — ABNORMAL LOW (ref 13.0–17.1)
MONO#: 0.3 10*3/uL (ref 0.1–0.9)
NEUT%: 63.2 % (ref 39.0–75.0)
RDW: 14.8 % — ABNORMAL HIGH (ref 11.0–14.6)
WBC: 3.8 10*3/uL — ABNORMAL LOW (ref 4.0–10.3)
lymph#: 1 10*3/uL (ref 0.9–3.3)

## 2010-09-14 ENCOUNTER — Ambulatory Visit: Payer: Medicare Other | Admitting: Physical Therapy

## 2010-09-17 ENCOUNTER — Ambulatory Visit: Payer: Medicare Other | Admitting: Physical Therapy

## 2010-09-17 ENCOUNTER — Encounter: Payer: Medicare Other | Admitting: Occupational Therapy

## 2010-09-19 ENCOUNTER — Ambulatory Visit: Payer: Medicare Other | Attending: Oncology | Admitting: Physical Therapy

## 2010-09-19 ENCOUNTER — Ambulatory Visit: Payer: Medicare Other | Admitting: Occupational Therapy

## 2010-09-19 DIAGNOSIS — IMO0001 Reserved for inherently not codable concepts without codable children: Secondary | ICD-10-CM | POA: Insufficient documentation

## 2010-09-19 DIAGNOSIS — M6281 Muscle weakness (generalized): Secondary | ICD-10-CM | POA: Insufficient documentation

## 2010-09-19 DIAGNOSIS — R269 Unspecified abnormalities of gait and mobility: Secondary | ICD-10-CM | POA: Insufficient documentation

## 2010-09-19 DIAGNOSIS — R5381 Other malaise: Secondary | ICD-10-CM | POA: Insufficient documentation

## 2010-09-20 ENCOUNTER — Ambulatory Visit: Payer: Medicare Other | Admitting: Occupational Therapy

## 2010-09-20 ENCOUNTER — Ambulatory Visit: Payer: Medicare Other | Admitting: Physical Therapy

## 2010-09-21 ENCOUNTER — Encounter: Payer: Medicare Other | Admitting: Occupational Therapy

## 2010-09-24 ENCOUNTER — Ambulatory Visit: Payer: Medicare Other | Admitting: Occupational Therapy

## 2010-09-26 ENCOUNTER — Ambulatory Visit: Payer: Medicare Other | Admitting: Occupational Therapy

## 2010-10-01 ENCOUNTER — Ambulatory Visit: Payer: Medicare Other | Admitting: Physical Therapy

## 2010-10-01 ENCOUNTER — Encounter: Payer: Medicare Other | Admitting: Occupational Therapy

## 2010-10-01 ENCOUNTER — Ambulatory Visit: Payer: Medicare Other | Admitting: Occupational Therapy

## 2010-10-03 ENCOUNTER — Encounter: Payer: Medicare Other | Admitting: Occupational Therapy

## 2010-10-03 ENCOUNTER — Ambulatory Visit: Payer: Medicare Other | Admitting: Occupational Therapy

## 2010-10-03 ENCOUNTER — Ambulatory Visit: Payer: Medicare Other | Admitting: Physical Therapy

## 2010-10-09 ENCOUNTER — Encounter (HOSPITAL_BASED_OUTPATIENT_CLINIC_OR_DEPARTMENT_OTHER): Payer: Medicare Other | Admitting: Oncology

## 2010-10-09 ENCOUNTER — Other Ambulatory Visit: Payer: Self-pay | Admitting: Oncology

## 2010-10-09 DIAGNOSIS — D61818 Other pancytopenia: Secondary | ICD-10-CM

## 2010-10-09 DIAGNOSIS — R5383 Other fatigue: Secondary | ICD-10-CM

## 2010-10-09 DIAGNOSIS — C712 Malignant neoplasm of temporal lobe: Secondary | ICD-10-CM

## 2010-10-09 DIAGNOSIS — D702 Other drug-induced agranulocytosis: Secondary | ICD-10-CM

## 2010-10-09 DIAGNOSIS — D6959 Other secondary thrombocytopenia: Secondary | ICD-10-CM

## 2010-10-09 DIAGNOSIS — R5381 Other malaise: Secondary | ICD-10-CM

## 2010-10-09 LAB — CBC WITH DIFFERENTIAL/PLATELET
BASO%: 0.5 % (ref 0.0–2.0)
EOS%: 1.3 % (ref 0.0–7.0)
LYMPH%: 25.8 % (ref 14.0–49.0)
MCH: 34.8 pg — ABNORMAL HIGH (ref 27.2–33.4)
MCHC: 35 g/dL (ref 32.0–36.0)
MCV: 99.3 fL — ABNORMAL HIGH (ref 79.3–98.0)
MONO%: 10.1 % (ref 0.0–14.0)
NEUT#: 2.3 10*3/uL (ref 1.5–6.5)
Platelets: 105 10*3/uL — ABNORMAL LOW (ref 140–400)
RBC: 3.23 10*6/uL — ABNORMAL LOW (ref 4.20–5.82)
RDW: 14.7 % — ABNORMAL HIGH (ref 11.0–14.6)

## 2010-10-09 LAB — COMPREHENSIVE METABOLIC PANEL
ALT: 32 U/L (ref 0–53)
AST: 25 U/L (ref 0–37)
Albumin: 3.8 g/dL (ref 3.5–5.2)
Alkaline Phosphatase: 81 U/L (ref 39–117)
Glucose, Bld: 142 mg/dL — ABNORMAL HIGH (ref 70–99)
Potassium: 3.4 mEq/L — ABNORMAL LOW (ref 3.5–5.3)
Sodium: 140 mEq/L (ref 135–145)
Total Bilirubin: 0.3 mg/dL (ref 0.3–1.2)
Total Protein: 7 g/dL (ref 6.0–8.3)

## 2010-10-10 ENCOUNTER — Encounter: Payer: Medicare Other | Admitting: Occupational Therapy

## 2010-10-12 ENCOUNTER — Encounter: Payer: Medicare Other | Admitting: Occupational Therapy

## 2010-10-17 ENCOUNTER — Ambulatory Visit: Payer: Medicare Other | Admitting: Occupational Therapy

## 2010-10-23 ENCOUNTER — Ambulatory Visit: Payer: Medicare Other | Attending: Oncology | Admitting: Occupational Therapy

## 2010-10-23 DIAGNOSIS — IMO0001 Reserved for inherently not codable concepts without codable children: Secondary | ICD-10-CM | POA: Insufficient documentation

## 2010-10-23 DIAGNOSIS — R269 Unspecified abnormalities of gait and mobility: Secondary | ICD-10-CM | POA: Insufficient documentation

## 2010-10-23 DIAGNOSIS — R5381 Other malaise: Secondary | ICD-10-CM | POA: Insufficient documentation

## 2010-10-23 DIAGNOSIS — M6281 Muscle weakness (generalized): Secondary | ICD-10-CM | POA: Insufficient documentation

## 2010-10-25 ENCOUNTER — Ambulatory Visit: Payer: Medicare Other | Admitting: Occupational Therapy

## 2010-10-29 ENCOUNTER — Encounter: Payer: Medicare Other | Admitting: Occupational Therapy

## 2010-10-31 ENCOUNTER — Encounter: Payer: Medicare Other | Admitting: Occupational Therapy

## 2010-11-01 ENCOUNTER — Ambulatory Visit: Payer: Medicare Other | Admitting: Occupational Therapy

## 2010-11-05 ENCOUNTER — Ambulatory Visit: Payer: Medicare Other | Admitting: Occupational Therapy

## 2010-11-07 ENCOUNTER — Ambulatory Visit: Payer: Medicare Other | Admitting: Occupational Therapy

## 2010-11-07 ENCOUNTER — Other Ambulatory Visit: Payer: Self-pay | Admitting: Oncology

## 2010-11-07 ENCOUNTER — Encounter (HOSPITAL_BASED_OUTPATIENT_CLINIC_OR_DEPARTMENT_OTHER): Payer: Medicare Other | Admitting: Oncology

## 2010-11-07 DIAGNOSIS — D702 Other drug-induced agranulocytosis: Secondary | ICD-10-CM

## 2010-11-07 DIAGNOSIS — C712 Malignant neoplasm of temporal lobe: Secondary | ICD-10-CM

## 2010-11-07 DIAGNOSIS — D61818 Other pancytopenia: Secondary | ICD-10-CM

## 2010-11-07 DIAGNOSIS — R358 Other polyuria: Secondary | ICD-10-CM

## 2010-11-07 DIAGNOSIS — D6959 Other secondary thrombocytopenia: Secondary | ICD-10-CM

## 2010-11-07 DIAGNOSIS — R3589 Other polyuria: Secondary | ICD-10-CM

## 2010-11-07 LAB — COMPREHENSIVE METABOLIC PANEL
ALT: 22 U/L (ref 0–53)
AST: 20 U/L (ref 0–37)
Albumin: 4 g/dL (ref 3.5–5.2)
Alkaline Phosphatase: 74 U/L (ref 39–117)
BUN: 14 mg/dL (ref 6–23)
Calcium: 8.8 mg/dL (ref 8.4–10.5)
Chloride: 107 mEq/L (ref 96–112)
Potassium: 3.4 mEq/L — ABNORMAL LOW (ref 3.5–5.3)
Sodium: 142 mEq/L (ref 135–145)
Total Protein: 11.9 g/dL — ABNORMAL HIGH (ref 6.0–8.3)

## 2010-11-07 LAB — CBC WITH DIFFERENTIAL/PLATELET
BASO%: 0.5 % (ref 0.0–2.0)
EOS%: 2.1 % (ref 0.0–7.0)
HGB: 10.9 g/dL — ABNORMAL LOW (ref 13.0–17.1)
MCH: 34.4 pg — ABNORMAL HIGH (ref 27.2–33.4)
MCHC: 35 g/dL (ref 32.0–36.0)
MCV: 98.2 fL — ABNORMAL HIGH (ref 79.3–98.0)
MONO%: 11.2 % (ref 0.0–14.0)
RBC: 3.17 10*6/uL — ABNORMAL LOW (ref 4.20–5.82)
RDW: 14.5 % (ref 11.0–14.6)
lymph#: 0.9 10*3/uL (ref 0.9–3.3)

## 2010-11-07 LAB — UA PROTEIN, DIPSTICK - CHCC: Protein, Urine: NEGATIVE mg/dL

## 2010-11-19 LAB — DIFFERENTIAL
Basophils Absolute: 0
Basophils Relative: 0
Basophils Relative: 0
Eosinophils Absolute: 0
Eosinophils Absolute: 0
Eosinophils Relative: 0
Monocytes Absolute: 0.7
Monocytes Absolute: 0.9
Monocytes Relative: 7
Neutro Abs: 14.6 — ABNORMAL HIGH
Neutrophils Relative %: 87 — ABNORMAL HIGH

## 2010-11-19 LAB — TYPE AND SCREEN
ABO/RH(D): A POS
Antibody Screen: NEGATIVE

## 2010-11-19 LAB — CBC
HCT: 42.3
Hemoglobin: 12.6 — ABNORMAL LOW
Hemoglobin: 13.8
Hemoglobin: 14
MCHC: 33.2
MCHC: 33.3
MCV: 93.5
RBC: 4.39
RDW: 12.1
RDW: 12.3
RDW: 12.4
WBC: 13.2 — ABNORMAL HIGH

## 2010-11-19 LAB — URINALYSIS, ROUTINE W REFLEX MICROSCOPIC
Bilirubin Urine: NEGATIVE
Bilirubin Urine: NEGATIVE
Hgb urine dipstick: NEGATIVE
Ketones, ur: NEGATIVE
Nitrite: NEGATIVE
Specific Gravity, Urine: 1.026
Specific Gravity, Urine: 1.027
Urobilinogen, UA: 0.2
pH: 5.5

## 2010-11-19 LAB — POCT I-STAT 7, (LYTES, BLD GAS, ICA,H+H)
Calcium, Ion: 1.08 — ABNORMAL LOW
O2 Saturation: 100
Patient temperature: 35.5
Potassium: 3.2 — ABNORMAL LOW
Sodium: 136
pCO2 arterial: 31.1 — ABNORMAL LOW
pH, Arterial: 7.515 — ABNORMAL HIGH

## 2010-11-19 LAB — BASIC METABOLIC PANEL
CO2: 26
Chloride: 106
Glucose, Bld: 102 — ABNORMAL HIGH
Potassium: 4.3
Sodium: 140

## 2010-11-19 LAB — ETHANOL: Alcohol, Ethyl (B): <5

## 2010-11-19 LAB — RAPID URINE DRUG SCREEN, HOSP PERFORMED
Cocaine: NOT DETECTED
Tetrahydrocannabinol: NOT DETECTED

## 2010-11-19 LAB — LACTATE DEHYDROGENASE, ISOENZYMES
LDH 2: 32 % (ref 29–42)
LDH Isoenzymes, Total: 130 U/L (ref 105–230)

## 2010-11-19 LAB — URINE MICROSCOPIC-ADD ON

## 2010-11-19 LAB — COMPREHENSIVE METABOLIC PANEL
ALT: 35
Alkaline Phosphatase: 59
Glucose, Bld: 116 — ABNORMAL HIGH
Potassium: 4.3
Sodium: 139
Total Protein: 7

## 2010-11-19 LAB — POCT CARDIAC MARKERS: Myoglobin, poc: >500

## 2010-11-30 ENCOUNTER — Encounter (HOSPITAL_BASED_OUTPATIENT_CLINIC_OR_DEPARTMENT_OTHER): Payer: Medicare Other | Admitting: Oncology

## 2010-11-30 DIAGNOSIS — C712 Malignant neoplasm of temporal lobe: Secondary | ICD-10-CM

## 2010-11-30 DIAGNOSIS — D702 Other drug-induced agranulocytosis: Secondary | ICD-10-CM

## 2010-11-30 DIAGNOSIS — D6959 Other secondary thrombocytopenia: Secondary | ICD-10-CM

## 2010-12-06 ENCOUNTER — Encounter (HOSPITAL_BASED_OUTPATIENT_CLINIC_OR_DEPARTMENT_OTHER): Payer: Medicare Other | Admitting: Oncology

## 2010-12-06 ENCOUNTER — Other Ambulatory Visit: Payer: Self-pay | Admitting: Oncology

## 2010-12-06 DIAGNOSIS — D6959 Other secondary thrombocytopenia: Secondary | ICD-10-CM

## 2010-12-06 DIAGNOSIS — C712 Malignant neoplasm of temporal lobe: Secondary | ICD-10-CM

## 2010-12-06 DIAGNOSIS — D702 Other drug-induced agranulocytosis: Secondary | ICD-10-CM

## 2010-12-06 DIAGNOSIS — Z1389 Encounter for screening for other disorder: Secondary | ICD-10-CM

## 2010-12-06 LAB — COMPREHENSIVE METABOLIC PANEL
ALT: 90 U/L — ABNORMAL HIGH (ref 0–53)
AST: 29 U/L (ref 0–37)
Albumin: 4 g/dL (ref 3.5–5.2)
BUN: 15 mg/dL (ref 6–23)
CO2: 20 mEq/L (ref 19–32)
Calcium: 8.8 mg/dL (ref 8.4–10.5)
Chloride: 108 mEq/L (ref 96–112)
Creatinine, Ser: 1 mg/dL (ref 0.50–1.35)
Potassium: 3.7 mEq/L (ref 3.5–5.3)

## 2010-12-06 LAB — CBC WITH DIFFERENTIAL/PLATELET
Basophils Absolute: 0 10*3/uL (ref 0.0–0.1)
Eosinophils Absolute: 0.1 10*3/uL (ref 0.0–0.5)
HCT: 30.6 % — ABNORMAL LOW (ref 38.4–49.9)
HGB: 10.7 g/dL — ABNORMAL LOW (ref 13.0–17.1)
LYMPH%: 24.2 % (ref 14.0–49.0)
MONO#: 0.3 10*3/uL (ref 0.1–0.9)
NEUT#: 2.2 10*3/uL (ref 1.5–6.5)
NEUT%: 64 % (ref 39.0–75.0)
Platelets: 121 10*3/uL — ABNORMAL LOW (ref 140–400)
WBC: 3.5 10*3/uL — ABNORMAL LOW (ref 4.0–10.3)
lymph#: 0.8 10*3/uL — ABNORMAL LOW (ref 0.9–3.3)

## 2010-12-13 ENCOUNTER — Encounter: Payer: Self-pay | Admitting: *Deleted

## 2010-12-24 ENCOUNTER — Telehealth: Payer: Self-pay | Admitting: *Deleted

## 2010-12-24 DIAGNOSIS — C712 Malignant neoplasm of temporal lobe: Secondary | ICD-10-CM

## 2010-12-24 MED ORDER — CLONIDINE HCL 0.1 MG PO TABS: 0.1000 mg | ORAL_TABLET | Freq: Two times a day (BID) | ORAL | Status: DC

## 2010-12-24 NOTE — Telephone Encounter (Signed)
NO NOTE

## 2010-12-27 ENCOUNTER — Other Ambulatory Visit: Payer: Self-pay | Admitting: *Deleted

## 2010-12-27 DIAGNOSIS — C712 Malignant neoplasm of temporal lobe: Secondary | ICD-10-CM

## 2010-12-27 MED ORDER — OXYCODONE-ACETAMINOPHEN 5-325 MG PO TABS: 1.0000 | ORAL_TABLET | ORAL | Status: DC | PRN

## 2010-12-27 NOTE — Telephone Encounter (Signed)
Wife called to pick up script for [facility] Percocet refill. Will pick up at front desk when ready.

## 2010-12-28 ENCOUNTER — Other Ambulatory Visit: Payer: Self-pay | Admitting: *Deleted

## 2010-12-28 DIAGNOSIS — C712 Malignant neoplasm of temporal lobe: Secondary | ICD-10-CM

## 2010-12-28 MED ORDER — CLONIDINE HCL 0.1 MG PO TABS: 0.1000 mg | ORAL_TABLET | Freq: Two times a day (BID) | ORAL | Status: DC

## 2010-12-31 ENCOUNTER — Other Ambulatory Visit: Payer: Self-pay | Admitting: *Deleted

## 2010-12-31 ENCOUNTER — Encounter: Payer: Self-pay | Admitting: *Deleted

## 2010-12-31 DIAGNOSIS — C712 Malignant neoplasm of temporal lobe: Secondary | ICD-10-CM

## 2010-12-31 MED ORDER — LEVETIRACETAM 750 MG PO TABS: 750.0000 mg | ORAL_TABLET | Freq: Two times a day (BID) | ORAL | Status: DC

## 2011-01-03 ENCOUNTER — Other Ambulatory Visit (HOSPITAL_BASED_OUTPATIENT_CLINIC_OR_DEPARTMENT_OTHER): Payer: Medicare Other | Admitting: Lab

## 2011-01-03 ENCOUNTER — Ambulatory Visit (HOSPITAL_BASED_OUTPATIENT_CLINIC_OR_DEPARTMENT_OTHER): Payer: Medicare Other | Admitting: Oncology

## 2011-01-03 ENCOUNTER — Other Ambulatory Visit: Payer: Self-pay | Admitting: *Deleted

## 2011-01-03 ENCOUNTER — Telehealth: Payer: Self-pay | Admitting: *Deleted

## 2011-01-03 ENCOUNTER — Other Ambulatory Visit: Payer: Self-pay | Admitting: Oncology

## 2011-01-03 DIAGNOSIS — E538 Deficiency of other specified B group vitamins: Secondary | ICD-10-CM

## 2011-01-03 DIAGNOSIS — D72819 Decreased white blood cell count, unspecified: Secondary | ICD-10-CM

## 2011-01-03 DIAGNOSIS — C719 Malignant neoplasm of brain, unspecified: Secondary | ICD-10-CM

## 2011-01-03 DIAGNOSIS — D696 Thrombocytopenia, unspecified: Secondary | ICD-10-CM

## 2011-01-03 DIAGNOSIS — C712 Malignant neoplasm of temporal lobe: Secondary | ICD-10-CM

## 2011-01-03 LAB — COMPREHENSIVE METABOLIC PANEL
Alkaline Phosphatase: 92 U/L (ref 39–117)
Glucose, Bld: 95 mg/dL (ref 70–99)
Sodium: 138 mEq/L (ref 135–145)
Total Bilirubin: 0.2 mg/dL — ABNORMAL LOW (ref 0.3–1.2)
Total Protein: 6.9 g/dL (ref 6.0–8.3)

## 2011-01-03 LAB — CBC WITH DIFFERENTIAL/PLATELET
Basophils Absolute: 0 10*3/uL (ref 0.0–0.1)
Eosinophils Absolute: 0.1 10*3/uL (ref 0.0–0.5)
HCT: 31.9 % — ABNORMAL LOW (ref 38.4–49.9)
LYMPH%: 32.1 % (ref 14.0–49.0)
MCV: 100.9 fL — ABNORMAL HIGH (ref 79.3–98.0)
MONO%: 11.6 % (ref 0.0–14.0)
NEUT#: 1.6 10*3/uL (ref 1.5–6.5)
NEUT%: 53.7 % (ref 39.0–75.0)
Platelets: 133 10*3/uL — ABNORMAL LOW (ref 140–400)
RBC: 3.16 10*6/uL — ABNORMAL LOW (ref 4.20–5.82)

## 2011-01-03 MED ORDER — ONDANSETRON HCL 8 MG PO TABS: 8.0000 mg | ORAL_TABLET | ORAL | Status: DC

## 2011-01-03 NOTE — Progress Notes (Signed)
OFFICE PROGRESS NOTE   INTERVAL HISTORY:   [name] returns as scheduled. He completed another cycle of chemotherapy since he was here on October 12. He was seen at [facility] on November 7 for vaccine treatment. He reports an MRI of the brain on November 7 showed no evidence of disease progression. He has noted improvement in his speech and memory. He reports stable right sided weakness. He denies seizures and new neurologic symptoms. The right arm pain is under better control with OxyContin.  Objective:  Vital signs in last 24 hours:  Blood pressure 97/67, pulse 69, temperature 97.8 F (36.6 C), temperature source Oral, height 5' 7.5" (1.715 m), weight 176 lb 6.4 oz (80.015 kg).    HEENT: There is a small ulcer at the left buccal mucosa. No thrush. Resp: Lungs clear bilaterally Cardio: Regular rate and rhythm GI: Nontender. Vascular: No leg edema. The left lower leg is slightly larger than the right side Neuro: He is alert and oriented. 3/5 strength in the right upper extremity. 4/5 strength with dorsi flexion at the right foot.       Lab Results:  CBC  Lab Results  Component Value Date   WBC 3.0* 01/03/2011   HGB 11.1* 01/03/2011   HCT 31.9* 01/03/2011   MCV 100.9* 01/03/2011   PLT 133* 01/03/2011    Chemistry:      Component Value Date/Time   NA 138 01/03/2011 1044   K 3.5 01/03/2011 1044   CL 103 01/03/2011 1044   CO2 25 01/03/2011 1044   GLUCOSE 95 01/03/2011 1044   BUN 19 01/03/2011 1044   CREATININE 1.04 01/03/2011 1044   CALCIUM 9.2 01/03/2011 1044   PROT 6.9 01/03/2011 1044   ALBUMIN 3.7 01/03/2011 1044   AST 21 01/03/2011 1044   ALT 31 01/03/2011 1044   ALKPHOS 92 01/03/2011 1044   BILITOT 0.2* 01/03/2011 1044   GFRNONAA >60 05/12/2010 0405   GFRAA  Value: >60        The eGFR has been calculated using the MDRD equation. This calculation has not been validated in all clinical situations. eGFR's persistently <60 mL/min signify possible Chronic Kidney  Disease. 05/12/2010 0405       Medications: I have reviewed the patient's current medications.  Assessment/Plan: 1. Glioblastoma multiforme.  An MRI of the brain during a hospital admission on 03/27/2010 was compared to an MRI from [place] on 02/22/2010.  There was an increase in the abnormal signal of the left posterior opercular region and periatrial region, concerning for progression of tumor.  Review of the MRI by Dr. Ferdinand Lango at [facility] confirmed disease progression.  Avastin was resumed on 05/28/2010.  Hydroxyurea was initiated on 06/07/2010.  The hydroxyurea was placed on hold on 06/27/2010 due to leukopenia and thrombocytopenia.   a. Initiation of salvage therapy with temozolomide and the Celldex vaccine beginning on 08/16/2010.  He has completed 5 cycles to date.   b. MRI of the brain at [facility] on 10/31/2010 is concerning for disease progression with an interval increase in enhancement surrounding the resection cavity and inferior margin of the putamen and left optic tract. c. MRI of the brain at [facility] on 11/28/2010 showed a slight interval increase in peripheral nodular enhancement surrounding the resection cavity d. He underwent a repeat MRI of the brain at [facility] on 12/26/2010-we will followup on this report 2. History of severe neutropenia and thrombocytopenia secondary to chemotherapy:  He received a platelet transfusion on 06/15/2009. 3. Right visual field deficit secondary to  progression of the glioblastoma multiforme, stable today. 4. 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 5. Hypothyroidism, maintained on Synthroid. 6. Hypertension, essential hypertension and an effect of Avastin. 7. History of nephrotic syndrome secondary to Avastin. 8. History of progressive thrombocytopenia, status post a bone marrow biopsy on 04/26/2010 with findings of a hypocellular marrow with decreased megakaryocytes and a left shift in the myeloid and erythroid series and minimal dyspoiesis.   Cytogenetics returned normal.  He again developed thrombocytopenia after receiving Avastin and beginning hydroxyurea in April 2012.  The platelet count has improved since the Avastin and hydroxyurea were discontinued. 9. Vitamin B12 deficiency. 10. Acute diverticulitis in April 2012, improved with ciprofloxacin and Flagyl.  Related to Avastin (?).   11. Adhesive capsulitis of the right shoulder, followed by Dr. Amedeo Plenty.  He now takes OxyContin/oxycodone for relief of pain.   Disposition:  Mr. Brucato appears stable aside from improvement in expressive aphasia. He is scheduled to begin another cycle of temodar today. He will return for a nadir CBC on November 28. He is scheduled for an office visit prior to the next cycle of chemotherapy on November 12. He continues monthly vaccine therapy and clinical followup at the [facility] brain tumor program.   Electa Sniff, MD  01/03/2011  2:00 PM

## 2011-01-03 NOTE — Telephone Encounter (Signed)
gve the pt his nov,dec 2012 appt calendar °

## 2011-01-03 NOTE — Telephone Encounter (Signed)
Faxed copy of today's labs and a note stating pt will begin next cycle of Temodar today to [facility] at [facility] center. [phone])  Pt takes 250mg  daily for 5 days.

## 2011-01-03 NOTE — Telephone Encounter (Signed)
Duplicate rx for Levetiracetam received.   RX was already filled at [facility]  On 12/31/10 - pt has not picked it up yet

## 2011-01-15 ENCOUNTER — Other Ambulatory Visit: Payer: Self-pay | Admitting: *Deleted

## 2011-01-15 DIAGNOSIS — C719 Malignant neoplasm of brain, unspecified: Secondary | ICD-10-CM

## 2011-01-15 MED ORDER — OXYCODONE HCL 10 MG PO TB12: 10.0000 mg | ORAL_TABLET | Freq: Two times a day (BID) | ORAL | Status: DC

## 2011-01-15 MED ORDER — TEMOZOLOMIDE 250 MG PO CAPS: 250.0000 mg | ORAL_CAPSULE | Freq: Every day | ORAL | Status: DC

## 2011-01-15 NOTE — Telephone Encounter (Signed)
RECEIVED A FAX FROM [facility] CONCERNING A PRESCRIPTION REFILL REQUEST FOR TEMODAR. THIS REQUEST WAS GIVEN TO DR.SHERRILL'S NURSE, SUSAN COWARD,RN.

## 2011-01-15 NOTE — Telephone Encounter (Signed)
VM requesting refill on Oxycontin. Took last pill this morning. Notified patient that script is ready to pick up.

## 2011-01-16 ENCOUNTER — Other Ambulatory Visit (HOSPITAL_BASED_OUTPATIENT_CLINIC_OR_DEPARTMENT_OTHER): Payer: Medicare Other | Admitting: Lab

## 2011-01-16 DIAGNOSIS — C719 Malignant neoplasm of brain, unspecified: Secondary | ICD-10-CM

## 2011-01-16 LAB — CBC WITH DIFFERENTIAL/PLATELET
Basophils Absolute: 0 10*3/uL (ref 0.0–0.1)
Eosinophils Absolute: 0.1 10*3/uL (ref 0.0–0.5)
HGB: 11.5 g/dL — ABNORMAL LOW (ref 13.0–17.1)
LYMPH%: 26.7 % (ref 14.0–49.0)
MCV: 100.4 fL — ABNORMAL HIGH (ref 79.3–98.0)
MONO%: 10.6 % (ref 0.0–14.0)
NEUT#: 2.1 10*3/uL (ref 1.5–6.5)
Platelets: 132 10*3/uL — ABNORMAL LOW (ref 140–400)
RBC: 3.29 10*6/uL — ABNORMAL LOW (ref 4.20–5.82)

## 2011-01-18 ENCOUNTER — Telehealth: Payer: Self-pay | Admitting: *Deleted

## 2011-01-18 NOTE — Telephone Encounter (Signed)
Patient requested wife call and report his urine is dark yellow. No burning, urgency or fever. Wife reports he is sleeping a lot this week and not drinking much. Instructed her to have him push po fluids and call if he has any other s/s of UTI. Dr. Truett Perna agrees

## 2011-01-22 ENCOUNTER — Other Ambulatory Visit: Payer: Self-pay | Admitting: *Deleted

## 2011-01-22 DIAGNOSIS — C712 Malignant neoplasm of temporal lobe: Secondary | ICD-10-CM

## 2011-01-22 MED ORDER — CLONIDINE HCL 0.1 MG PO TABS: 0.1000 mg | ORAL_TABLET | Freq: Two times a day (BID) | ORAL | Status: DC

## 2011-01-30 ENCOUNTER — Other Ambulatory Visit: Payer: Self-pay | Admitting: Oncology

## 2011-01-30 ENCOUNTER — Other Ambulatory Visit (HOSPITAL_BASED_OUTPATIENT_CLINIC_OR_DEPARTMENT_OTHER): Payer: Medicare Other | Admitting: Lab

## 2011-01-30 ENCOUNTER — Telehealth: Payer: Self-pay | Admitting: *Deleted

## 2011-01-30 ENCOUNTER — Ambulatory Visit (HOSPITAL_BASED_OUTPATIENT_CLINIC_OR_DEPARTMENT_OTHER): Payer: Medicare Other | Admitting: Oncology

## 2011-01-30 ENCOUNTER — Other Ambulatory Visit: Payer: Self-pay | Admitting: *Deleted

## 2011-01-30 VITALS — BP 110/77 | HR 71 | Temp 96.8°F | Ht 67.5 in | Wt 176.7 lb

## 2011-01-30 DIAGNOSIS — E291 Testicular hypofunction: Secondary | ICD-10-CM

## 2011-01-30 DIAGNOSIS — R300 Dysuria: Secondary | ICD-10-CM

## 2011-01-30 DIAGNOSIS — C719 Malignant neoplasm of brain, unspecified: Secondary | ICD-10-CM

## 2011-01-30 DIAGNOSIS — Z23 Encounter for immunization: Secondary | ICD-10-CM

## 2011-01-30 DIAGNOSIS — F4321 Adjustment disorder with depressed mood: Secondary | ICD-10-CM

## 2011-01-30 DIAGNOSIS — C712 Malignant neoplasm of temporal lobe: Secondary | ICD-10-CM

## 2011-01-30 LAB — COMPREHENSIVE METABOLIC PANEL
ALT: 38 U/L (ref 0–53)
CO2: 24 mEq/L (ref 19–32)
Calcium: 8.8 mg/dL (ref 8.4–10.5)
Chloride: 104 mEq/L (ref 96–112)
Sodium: 139 mEq/L (ref 135–145)
Total Protein: 6.4 g/dL (ref 6.0–8.3)

## 2011-01-30 LAB — URINALYSIS, MICROSCOPIC - CHCC
Glucose: NEGATIVE g/dL
Leukocyte Esterase: NEGATIVE
Nitrite: NEGATIVE

## 2011-01-30 LAB — CBC WITH DIFFERENTIAL/PLATELET
BASO%: 0.5 % (ref 0.0–2.0)
Eosinophils Absolute: 0.1 10*3/uL (ref 0.0–0.5)
HCT: 32.9 % — ABNORMAL LOW (ref 38.4–49.9)
MCHC: 34.9 g/dL (ref 32.0–36.0)
MONO#: 0.4 10*3/uL (ref 0.1–0.9)
NEUT#: 1.4 10*3/uL — ABNORMAL LOW (ref 1.5–6.5)
NEUT%: 49.4 % (ref 39.0–75.0)
RBC: 3.27 10*6/uL — ABNORMAL LOW (ref 4.20–5.82)
WBC: 2.9 10*3/uL — ABNORMAL LOW (ref 4.0–10.3)
lymph#: 1 10*3/uL (ref 0.9–3.3)

## 2011-01-30 LAB — MAGNESIUM: Magnesium: 1.7 mg/dL (ref 1.5–2.5)

## 2011-01-30 MED ORDER — INFLUENZA VIRUS VACC SPLIT PF IM SUSP
0.5000 mL | INTRAMUSCULAR | Status: AC
  Administered 2011-01-30: 0.5 mL via INTRAMUSCULAR
  Filled 2011-01-30: qty 0.5

## 2011-01-30 NOTE — Progress Notes (Signed)
OFFICE PROGRESS NOTE   INTERVAL HISTORY:   He returns as scheduled. He is scheduled to begin another cycle of chemotherapy December 13. He reports tolerating the chemotherapy well. He denies nausea, mouth sores, and diarrhea. He has no new neurologic symptoms. He is frustrated by the lack of use of his right arm and leg. He feels "depressed ". He stays in bed for the majority of the day. He continues monthly vaccine therapy at [facility].  Objective:  Vital signs in last 24 hours:  Blood pressure 110/77, pulse 71, temperature 96.8 F (36 C), temperature source Oral, height 5' 7.5" (1.715 m), weight 176 lb 11.2 oz (80.151 kg).    HEENT: No thrush or ulcers Resp: Lungs clear bilaterally Cardio: Regular rate and rhythm GI: The abdomen is nontender. No hepatomegaly Vascular: No leg edema Neuro: Stable right visual field deficit. 3-4/5 strength in the right arm and hand. 45 strength with dorsi flexion of the right foot  .    Lab Results:  Lab Results  Component Value Date   WBC 2.9* 01/30/2011   HGB 11.5* 01/30/2011   HCT 32.9* 01/30/2011   MCV 100.5* 01/30/2011   PLT 130* 01/30/2011   ANC 1.4 01/30/2011   Medications: I have reviewed the patient's current medications.  Assessment/Plan: 1. Glioblastoma multiforme. An MRI of the brain during a hospital admission on 03/27/2010 was compared to an MRI from [place] on 02/22/2010. There was an increase in the abnormal signal of the left posterior opercular region and periatrial region, concerning for progression of tumor. Review of the MRI by Dr. Ferdinand Lango at [facility] confirmed disease progression. Avastin was resumed on 05/28/2010. Hydroxyurea was initiated on 06/07/2010. The hydroxyurea was placed on hold on 06/27/2010 due to leukopenia and thrombocytopenia.  a. Initiation of salvage therapy with temozolomide and the Celldex vaccine beginning on 08/16/2010. He has completed 5 cycles to date.  b. MRI of the brain at [facility] on 10/31/2010 is concerning  for disease progression with an interval increase in enhancement surrounding the resection cavity and inferior margin of the putamen and left optic tract. c. MRI of the brain at [facility] on 11/28/2010 showed a slight interval increase in peripheral nodular enhancement surrounding the resection cavity d. He underwent a repeat MRI of the brain at [facility] on 12/26/2010-this showed a stable appearance of the left temporal lobe resection cavity and marginal enhancement. 2. 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 3. Right visual field deficit secondary to progression of the glioblastoma multiforme, stable today. 4. 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 5. Hypothyroidism, maintained on Synthroid. 6. Hypertension, essential hypertension and an effect of Avastin. 7. History of nephrotic syndrome secondary to Avastin. 8. History of progressive thrombocytopenia, status post a bone marrow biopsy on 04/26/2010 with findings of a hypocellular marrow with decreased megakaryocytes and a left shift in the myeloid and erythroid series and minimal dyspoiesis. Cytogenetics returned normal. He again developed thrombocytopenia after receiving Avastin and beginning hydroxyurea in April 2012. The platelet count has improved since the Avastin and hydroxyurea were discontinued. 9. Vitamin B12 deficiency. 10. Acute diverticulitis in April 2012, improved with ciprofloxacin and Flagyl. Related to Avastin (?).  11. Adhesive capsulitis of the right shoulder, followed by Dr. Amedeo Plenty. He now takes OxyContin/oxycodone for relief of pain. 12. 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 Low serum testosterone level on labs  requested by Dr. Ferdinand Lango in November 2012-he will contact Dr. Forde Dandy for recommendations regarding hormone  replacement.    Disposition:  Mr. Orsak appears stable from a neurologic. He will continue the Celldex vaccine per the [facility] protocol. He continues monthly temozolamide. He'll begin the next cycle of chemotherapy on December 13. He will return for a nadir CBC on December 27. He is scheduled for an office visit on January 9.  He appears to have significant depression. We will [facility]. He feels that he would benefit from an exercise program. We will arrange for a physical therapy evaluation for recommendations regarding home exercise. He received an influenza vaccine today.  Electa Sniff, MD  01/30/2011  5:26 PM

## 2011-01-30 NOTE — Telephone Encounter (Signed)
Made patient aware that his U/A was negative per MD. Dr. Truett Perna suggests he call Dr. Evlyn Kanner to recommend testosterone replacement therapy (faxed copy of labs to office). Dr. Truett Perna will call [facility] regarding PT/Exercise program/referral for him.

## 2011-01-30 NOTE — Telephone Encounter (Signed)
Requested patient be seen regarding management of his depression.

## 2011-02-01 ENCOUNTER — Telehealth: Payer: Self-pay | Admitting: *Deleted

## 2011-02-01 LAB — URINE CULTURE

## 2011-02-01 NOTE — Telephone Encounter (Signed)
Notified patient that U/A and culture were negative per Dr. Truett Perna.

## 2011-02-04 ENCOUNTER — Ambulatory Visit (INDEPENDENT_AMBULATORY_CARE_PROVIDER_SITE_OTHER): Payer: PRIVATE HEALTH INSURANCE | Admitting: Psychiatry

## 2011-02-04 DIAGNOSIS — F0630 Mood disorder due to known physiological condition, unspecified: Secondary | ICD-10-CM

## 2011-02-05 NOTE — Progress Notes (Signed)
Patient seen for initial psychological evaluation.  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  He is also struggling with marital issues.  The patient is quite agreeable to pursuing counseling at this time and is scheduled for his next appointment on 02-25-2011.

## 2011-02-13 ENCOUNTER — Other Ambulatory Visit: Payer: Self-pay | Admitting: *Deleted

## 2011-02-13 DIAGNOSIS — C712 Malignant neoplasm of temporal lobe: Secondary | ICD-10-CM

## 2011-02-13 DIAGNOSIS — C719 Malignant neoplasm of brain, unspecified: Secondary | ICD-10-CM

## 2011-02-13 MED ORDER — OXYCODONE HCL 10 MG PO TB12: 10.0000 mg | ORAL_TABLET | Freq: Two times a day (BID) | ORAL | Status: DC

## 2011-02-13 MED ORDER — OXYCODONE-ACETAMINOPHEN 5-325 MG PO TABS: 1.0000 | ORAL_TABLET | ORAL | Status: DC | PRN

## 2011-02-13 NOTE — Telephone Encounter (Signed)
Patient called requesting to pick up refill scripts on his pain medications on 02/14/11. Made him aware that scripts will be available for pick up tomorrow.

## 2011-02-14 ENCOUNTER — Other Ambulatory Visit (HOSPITAL_BASED_OUTPATIENT_CLINIC_OR_DEPARTMENT_OTHER): Payer: PRIVATE HEALTH INSURANCE | Admitting: Lab

## 2011-02-14 DIAGNOSIS — C719 Malignant neoplasm of brain, unspecified: Secondary | ICD-10-CM

## 2011-02-14 LAB — CBC WITH DIFFERENTIAL/PLATELET
BASO%: 0.2 % (ref 0.0–2.0)
MCHC: 35.4 g/dL (ref 32.0–36.0)
MONO#: 0.3 10*3/uL (ref 0.1–0.9)
RBC: 3.03 10*6/uL — ABNORMAL LOW (ref 4.20–5.82)
RDW: 14.2 % (ref 11.0–14.6)
WBC: 2.7 10*3/uL — ABNORMAL LOW (ref 4.0–10.3)
lymph#: 1 10*3/uL (ref 0.9–3.3)

## 2011-02-15 ENCOUNTER — Telehealth: Payer: Self-pay | Admitting: *Deleted

## 2011-02-15 NOTE — Telephone Encounter (Signed)
Patient reports several in family have had respiratory illness and he has started coughing and sore throat. No fever. Had flu vaccine. Asking if MD can order something to prevent illness? Per Dr. Truett Perna: Only way to prevent viral illness is good handwashing and good cough hygiene. Push fluids, take OTC med for symptom relief. Do not drink or eat from same utensils as sick members in family. Wash dishes in hot water.

## 2011-02-16 ENCOUNTER — Other Ambulatory Visit: Payer: Self-pay | Admitting: Hematology & Oncology

## 2011-02-16 DIAGNOSIS — R110 Nausea: Secondary | ICD-10-CM

## 2011-02-16 DIAGNOSIS — J101 Influenza due to other identified influenza virus with other respiratory manifestations: Secondary | ICD-10-CM

## 2011-02-16 MED ORDER — OSELTAMIVIR PHOSPHATE 75 MG PO CAPS: 75.0000 mg | ORAL_CAPSULE | Freq: Two times a day (BID) | ORAL | Status: AC

## 2011-02-16 MED ORDER — PROMETHAZINE HCL 25 MG PO TABS: 12.5000 mg | ORAL_TABLET | Freq: Four times a day (QID) | ORAL | Status: AC | PRN

## 2011-02-21 ENCOUNTER — Telehealth: Payer: Self-pay | Admitting: *Deleted

## 2011-02-21 NOTE — Telephone Encounter (Signed)
Vaccine #7 given on 02/20/11. Needs labs week of 02/25/11 to begin Temodar on 02/28/11.

## 2011-02-25 ENCOUNTER — Ambulatory Visit: Payer: PRIVATE HEALTH INSURANCE | Admitting: Psychiatry

## 2011-02-25 ENCOUNTER — Other Ambulatory Visit: Payer: Self-pay | Admitting: *Deleted

## 2011-02-25 DIAGNOSIS — C719 Malignant neoplasm of brain, unspecified: Secondary | ICD-10-CM

## 2011-02-25 MED ORDER — TEMOZOLOMIDE 250 MG PO CAPS: 250.0000 mg | ORAL_CAPSULE | Freq: Every day | ORAL | Status: DC

## 2011-02-25 NOTE — Progress Notes (Unsigned)
Patient cancelled appointment for today saying he was ill. He said he would call back to reschedule.

## 2011-02-27 ENCOUNTER — Ambulatory Visit (HOSPITAL_BASED_OUTPATIENT_CLINIC_OR_DEPARTMENT_OTHER): Payer: Medicare Other | Admitting: Oncology

## 2011-02-27 ENCOUNTER — Telehealth: Payer: Self-pay | Admitting: Oncology

## 2011-02-27 ENCOUNTER — Other Ambulatory Visit (HOSPITAL_BASED_OUTPATIENT_CLINIC_OR_DEPARTMENT_OTHER): Payer: Medicare Other | Admitting: Lab

## 2011-02-27 VITALS — BP 123/79 | HR 70 | Temp 97.2°F | Ht 67.5 in | Wt 178.3 lb

## 2011-02-27 DIAGNOSIS — C719 Malignant neoplasm of brain, unspecified: Secondary | ICD-10-CM

## 2011-02-27 DIAGNOSIS — M7500 Adhesive capsulitis of unspecified shoulder: Secondary | ICD-10-CM

## 2011-02-27 LAB — CBC WITH DIFFERENTIAL/PLATELET
BASO%: 0.2 % (ref 0.0–2.0)
LYMPH%: 28 % (ref 14.0–49.0)
MCHC: 34.6 g/dL (ref 32.0–36.0)
MONO#: 0.5 10*3/uL (ref 0.1–0.9)
MONO%: 12 % (ref 0.0–14.0)
Platelets: 116 10*3/uL — ABNORMAL LOW (ref 140–400)
RBC: 2.98 10*6/uL — ABNORMAL LOW (ref 4.20–5.82)
WBC: 3.9 10*3/uL — ABNORMAL LOW (ref 4.0–10.3)

## 2011-02-27 LAB — COMPREHENSIVE METABOLIC PANEL
ALT: 26 U/L (ref 0–53)
CO2: 24 mEq/L (ref 19–32)
Calcium: 8.5 mg/dL (ref 8.4–10.5)
Chloride: 105 mEq/L (ref 96–112)
Potassium: 3.9 mEq/L (ref 3.5–5.3)
Sodium: 138 mEq/L (ref 135–145)
Total Bilirubin: 0.3 mg/dL (ref 0.3–1.2)
Total Protein: 6.5 g/dL (ref 6.0–8.3)

## 2011-02-27 NOTE — Progress Notes (Signed)
OFFICE PROGRESS NOTE   INTERVAL HISTORY:   She returns as scheduled. He continues chemotherapy and vaccine treatment. He reports no new neurologic symptoms. He complains of continued pain at the right arm/shoulder and diffuse "aching ". He had the "flu "last week with a fever and cough. The symptoms have improved. Multiple family members were sick.  He underwent a restaging MRI of the brain at [facility] on 02/20/2011. Irregular enhancing and nonenhancing tumor along the margins of the resection cavity measured 4.1 x 1.8 cm compared to a previous measurement a 4.4 x 2.1 cm. No new area of abnormal enhancement. He reports the counseling session with Dr. Ferdinand Lango went well and he plans to schedule a followup appointment.  Objective:  Vital signs in last 24 hours:  Blood pressure 123/79, pulse 70, temperature 97.2 F (36.2 C), temperature source Oral, height 5' 7.5" (1.715 m), weight 178 lb 4.8 oz (80.876 kg).    HEENT:  No thrush or ulcers Resp: Lungs clear bilaterally Cardio: Regular rate and GI: Nontender. No hepatomegaly Vascular: No leg edema Neuro: Alert and oriented. The motor examination is intact in the left upper and lower extremity. 4/5 strength with dorsi flexion and internal/external rotation at the right foot. 2/5 strength at the right arm and hand.      Lab Results:  Lab Results  Component Value Date   WBC 3.9* 02/27/2011   HGB 10.4* 02/27/2011   HCT 30.2* 02/27/2011   MCV 101.3* 02/27/2011   PLT 116* 02/27/2011      Medications: I have reviewed the patient's current medications.  Assessment/Plan: 1. Glioblastoma multiforme. An MRI of the brain during a hospital admission on 03/27/2010 was compared to an MRI from [place] on 02/22/2010. There was an increase in the abnormal signal of the left posterior opercular region and periatrial region, concerning for progression of tumor. Review of the MRI by Dr. Ferdinand Lango at [facility] confirmed disease progression. Avastin was resumed on 05/28/2010.  Hydroxyurea was initiated on 06/07/2010. The hydroxyurea was placed on hold on 06/27/2010 due to leukopenia and thrombocytopenia.  a. Initiation of salvage therapy with temozolomide and the Celldex vaccine beginning on 08/16/2010.  b. MRI of the brain at [facility] on 10/31/2010 is concerning for disease progression with an interval increase in enhancement surrounding the resection cavity and inferior margin of the putamen and left optic tract. c. MRI of the brain at [facility] on 11/28/2010 showed a slight interval increase in peripheral nodular enhancement surrounding the resection cavity d. He underwent a repeat MRI of the brain at [facility] on 12/26/2010-this showed a stable appearance of the left temporal lobe resection cavity and marginal enhancement. e. Restaging MRI of the brain 12/26/2010 at [facility] with a slight interval decrease in the size of residual left temporal lobe tumor 2. 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 3. Right visual field deficit secondary to progression of the glioblastoma multiforme, stable today. 4. 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 5. Hypothyroidism, maintained on Synthroid. 6. Hypertension, essential hypertension and an effect of Avastin. 7. History of nephrotic syndrome secondary to Avastin. 8. History of progressive thrombocytopenia, status post a bone marrow biopsy on 04/26/2010 with findings of a hypocellular marrow with decreased megakaryocytes and a left shift in the myeloid and erythroid series and minimal dyspoiesis. Cytogenetics returned normal. He again developed thrombocytopenia after receiving Avastin and beginning hydroxyurea in April 2012. The platelet count has improved since the Avastin and hydroxyurea were discontinued. 9.  Vitamin B12 deficiency. 10. Acute diverticulitis in April 2012, improved with ciprofloxacin and Flagyl. Related to Avastin (?).  11. Adhesive  capsulitis of the right shoulder, followed by Dr. Amedeo Plenty. He now takes OxyContin/oxycodone for relief of pain. 12. 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 Low serum testosterone level on labs requested by Dr. Ferdinand Lango in November [place] has prescribed testosterone replacement.  14."aching "-? Related to the recent URI or vaccine therapy      Disposition:  He appears stable from a neurologic standpoint. The restaging MRI on 02/20/2011 showed improvement in the left temporal lobe tumor. He will begin another cycle of temozolomide on January 10. He continues monthly vaccine therapy at [facility]. He will return for an office and lab visit in one month. He is scheduled for labs at [facility] on January 30.  I will contact Dr. Phillip Heal and to discuss physical therapy and management of the right shoulder adhesion.   Electa Sniff, MD  02/27/2011  3:27 PM

## 2011-02-27 NOTE — Telephone Encounter (Signed)
appt made for 2/6 9:30/10:00 per dr Truett Perna from 1/9 pof  aom

## 2011-02-28 ENCOUNTER — Other Ambulatory Visit: Payer: Self-pay | Admitting: *Deleted

## 2011-02-28 DIAGNOSIS — C719 Malignant neoplasm of brain, unspecified: Secondary | ICD-10-CM

## 2011-02-28 MED ORDER — ONDANSETRON HCL 8 MG PO TABS: 8.0000 mg | ORAL_TABLET | ORAL | Status: DC

## 2011-02-28 NOTE — Telephone Encounter (Signed)
Message from pt's wife requesting refill on Zofran for pt to take pre-Temodar. Rx sent electronically.

## 2011-03-11 ENCOUNTER — Other Ambulatory Visit: Payer: Self-pay

## 2011-03-11 DIAGNOSIS — C712 Malignant neoplasm of temporal lobe: Secondary | ICD-10-CM

## 2011-03-11 MED ORDER — LEVETIRACETAM 750 MG PO TABS: 750.0000 mg | ORAL_TABLET | Freq: Two times a day (BID) | ORAL | Status: DC

## 2011-03-18 ENCOUNTER — Other Ambulatory Visit: Payer: Self-pay | Admitting: *Deleted

## 2011-03-18 DIAGNOSIS — C719 Malignant neoplasm of brain, unspecified: Secondary | ICD-10-CM

## 2011-03-18 MED ORDER — OXYCODONE HCL 10 MG PO TB12: 10.0000 mg | ORAL_TABLET | Freq: Two times a day (BID) | ORAL | Status: DC

## 2011-03-18 NOTE — Telephone Encounter (Signed)
Wife needs to pick up script for oxycontin

## 2011-03-26 ENCOUNTER — Other Ambulatory Visit: Payer: Self-pay | Admitting: Internal Medicine

## 2011-03-26 ENCOUNTER — Other Ambulatory Visit: Payer: Self-pay | Admitting: *Deleted

## 2011-03-26 DIAGNOSIS — C712 Malignant neoplasm of temporal lobe: Secondary | ICD-10-CM

## 2011-03-26 MED ORDER — CLONIDINE HCL 0.1 MG PO TABS: 0.1000 mg | ORAL_TABLET | Freq: Two times a day (BID) | ORAL | Status: DC

## 2011-03-27 ENCOUNTER — Telehealth: Payer: Self-pay | Admitting: Oncology

## 2011-03-27 ENCOUNTER — Other Ambulatory Visit: Payer: Medicare Other

## 2011-03-27 ENCOUNTER — Ambulatory Visit (HOSPITAL_BASED_OUTPATIENT_CLINIC_OR_DEPARTMENT_OTHER): Payer: PRIVATE HEALTH INSURANCE | Admitting: Oncology

## 2011-03-27 VITALS — BP 99/67 | HR 96 | Temp 98.2°F | Ht 67.5 in | Wt 177.7 lb

## 2011-03-27 DIAGNOSIS — C719 Malignant neoplasm of brain, unspecified: Secondary | ICD-10-CM

## 2011-03-27 LAB — CBC WITH DIFFERENTIAL/PLATELET
BASO%: 0.4 % (ref 0.0–2.0)
EOS%: 1.2 % (ref 0.0–7.0)
HCT: 32.1 % — ABNORMAL LOW (ref 38.4–49.9)
MCH: 35.8 pg — ABNORMAL HIGH (ref 27.2–33.4)
MCHC: 35.2 g/dL (ref 32.0–36.0)
MONO#: 0.4 10*3/uL (ref 0.1–0.9)
NEUT%: 59.8 % (ref 39.0–75.0)
RBC: 3.15 10*6/uL — ABNORMAL LOW (ref 4.20–5.82)
RDW: 14.1 % (ref 11.0–14.6)
WBC: 4.2 10*3/uL (ref 4.0–10.3)
lymph#: 1.2 10*3/uL (ref 0.9–3.3)

## 2011-03-27 LAB — COMPREHENSIVE METABOLIC PANEL
ALT: 28 U/L (ref 0–53)
AST: 17 U/L (ref 0–37)
Calcium: 9.5 mg/dL (ref 8.4–10.5)
Chloride: 99 mEq/L (ref 96–112)
Creatinine, Ser: 1.09 mg/dL (ref 0.50–1.35)
Sodium: 135 mEq/L (ref 135–145)
Total Protein: 7.2 g/dL (ref 6.0–8.3)

## 2011-03-27 NOTE — Progress Notes (Signed)
OFFICE PROGRESS NOTE   INTERVAL HISTORY:   He received another vaccine treatment at [facility] last week. He is scheduled to begin another cycle of chemotherapy tomorrow. He reports no new neurologic symptoms.  He saw Dr. Amedeo Plenty for the adhesive capsulitis of the right shoulder. He reports improvement in motion and pain following a steroid injection. He continues physical therapy.  Objective:  Vital signs in last 24 hours:  Blood pressure 99/67, pulse 96, temperature 98.2 F (36.8 C), temperature source Oral, height 5' 7.5" (1.715 m), weight 177 lb 11.2 oz (80.604 kg).    HEENT: No thrush or ulcers Resp: Lungs clear bilaterally Cardio: Regular rate and rhythm GI: No hepatosplenomegaly, nontender Vascular: No leg edema Neuro: Alert and oriented, 3/5 strength at the right arm and hand. Stable right visual field deficit.    Lab Results:  Lab Results  Component Value Date   WBC 4.2 03/27/2011   HGB 11.3* 03/27/2011   HCT 32.1* 03/27/2011   MCV 101.7* 03/27/2011   PLT 152 03/27/2011      Medications: I have reviewed the patient's current medications. Assessment/plan:  1. Glioblastoma multiforme. An MRI of the brain during a hospital admission on 03/27/2010 was compared to an MRI from [place] on 02/22/2010. There was an increase in the abnormal signal of the left posterior opercular region and periatrial region, concerning for progression of tumor. Review of the MRI by Dr. Ferdinand Lango at [facility] confirmed disease progression. Avastin was resumed on 05/28/2010. Hydroxyurea was initiated on 06/07/2010. The hydroxyurea was placed on hold on 06/27/2010 due to leukopenia and thrombocytopenia.  a. Initiation of salvage therapy with temozolomide and the Celldex vaccine beginning on 08/16/2010.  b. MRI of the brain at [facility] on 10/31/2010 is concerning for disease progression with an interval increase in enhancement surrounding the resection cavity and inferior margin of the putamen and left optic tract. c. MRI of  the brain at [facility] on 11/28/2010 showed a slight interval increase in peripheral nodular enhancement surrounding the resection cavity d. He underwent a repeat MRI of the brain at [facility] on 12/26/2010-this showed a stable appearance of the left temporal lobe resection cavity and marginal enhancement. e. Restaging MRI of the brain 12/26/2010 at [facility] with a slight interval decrease in the size of residual left temporal lobe tumor f. Restaging MRI of the brain at [facility] on 02/20/2011 revealed a slight interval decrease in the residual left temporal lobe tumor 2. 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 3. Right visual field deficit secondary to progression of the glioblastoma multiforme, stable today. 4. 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 5. Hypothyroidism, maintained on Synthroid. 6. Hypertension, essential hypertension and an effect of Avastin. 7. History of nephrotic syndrome secondary to Avastin. 8. History of progressive thrombocytopenia, status post a bone marrow biopsy on 04/26/2010 with findings of a hypocellular marrow with decreased megakaryocytes and a left shift in the myeloid and erythroid series and minimal dyspoiesis. Cytogenetics returned normal. He again developed thrombocytopenia after receiving Avastin and beginning hydroxyurea in April 2012. The platelet count has improved since the Avastin and hydroxyurea were discontinued. 9. Vitamin B12 deficiency. 10. Acute diverticulitis in April 2012, improved with ciprofloxacin and Flagyl. Related to Avastin (?).  11. Adhesive capsulitis of the right shoulder, followed by Dr. Amedeo Plenty. He now takes OxyContin/oxycodone for relief of pain. Currently undergoing a physical therapy program and steroid injection therapy 12. 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 Low serum testosterone level  on labs requested by Dr. Ferdinand Lango in November [place] has prescribed testosterone replacement.     Disposition:  He appears stable. He will begin another cycle of Temodar tomorrow. He is scheduled for a restaging MRI evaluation at [facility] next month. He will return for an office and lab visit here on 04/25/2011.   Electa Sniff, MD  03/27/2011  7:20 PM

## 2011-03-27 NOTE — Telephone Encounter (Signed)
Gv pt appt for march2013 °

## 2011-03-28 ENCOUNTER — Telehealth: Payer: Self-pay | Admitting: *Deleted

## 2011-03-28 NOTE — Telephone Encounter (Signed)
Faxed copy of lab results from 03/27/11 with note attached that pt will begin next cycle of Temodar on 03/28/11 to [place] at [facility] brain tumor center. [phone].

## 2011-04-08 ENCOUNTER — Other Ambulatory Visit: Payer: Self-pay

## 2011-04-08 ENCOUNTER — Other Ambulatory Visit: Payer: Self-pay | Admitting: *Deleted

## 2011-04-08 DIAGNOSIS — C719 Malignant neoplasm of brain, unspecified: Secondary | ICD-10-CM

## 2011-04-08 DIAGNOSIS — C712 Malignant neoplasm of temporal lobe: Secondary | ICD-10-CM

## 2011-04-08 MED ORDER — OXYCODONE-ACETAMINOPHEN 5-325 MG PO TABS: 1.0000 | ORAL_TABLET | ORAL | Status: DC | PRN

## 2011-04-08 MED ORDER — CLONAZEPAM 0.5 MG PO TABS: 0.5000 mg | ORAL_TABLET | Freq: Every day | ORAL | Status: DC

## 2011-04-08 NOTE — Telephone Encounter (Signed)
Wife called to request refill on Percocet. Will pick up.

## 2011-04-18 ENCOUNTER — Other Ambulatory Visit: Payer: Self-pay | Admitting: *Deleted

## 2011-04-18 DIAGNOSIS — C719 Malignant neoplasm of brain, unspecified: Secondary | ICD-10-CM

## 2011-04-18 MED ORDER — OXYCODONE HCL 10 MG PO TB12: 10.0000 mg | ORAL_TABLET | Freq: Two times a day (BID) | ORAL | Status: DC

## 2011-04-22 ENCOUNTER — Other Ambulatory Visit: Payer: Self-pay | Admitting: *Deleted

## 2011-04-22 ENCOUNTER — Other Ambulatory Visit: Payer: Self-pay | Admitting: Internal Medicine

## 2011-04-22 DIAGNOSIS — C719 Malignant neoplasm of brain, unspecified: Secondary | ICD-10-CM

## 2011-04-22 MED ORDER — TEMOZOLOMIDE 250 MG PO CAPS: 250.0000 mg | ORAL_CAPSULE | Freq: Every day | ORAL | Status: DC

## 2011-04-23 ENCOUNTER — Other Ambulatory Visit: Payer: Self-pay | Admitting: Internal Medicine

## 2011-04-25 ENCOUNTER — Other Ambulatory Visit: Payer: Self-pay | Admitting: *Deleted

## 2011-04-25 ENCOUNTER — Ambulatory Visit (HOSPITAL_BASED_OUTPATIENT_CLINIC_OR_DEPARTMENT_OTHER): Payer: Medicare Other | Admitting: Oncology

## 2011-04-25 ENCOUNTER — Other Ambulatory Visit (HOSPITAL_BASED_OUTPATIENT_CLINIC_OR_DEPARTMENT_OTHER): Payer: Medicare Other | Admitting: Lab

## 2011-04-25 VITALS — BP 114/70 | HR 84 | Temp 97.1°F | Ht 67.5 in | Wt 181.7 lb

## 2011-04-25 DIAGNOSIS — C719 Malignant neoplasm of brain, unspecified: Secondary | ICD-10-CM

## 2011-04-25 DIAGNOSIS — H5340 Unspecified visual field defects: Secondary | ICD-10-CM

## 2011-04-25 DIAGNOSIS — Z09 Encounter for follow-up examination after completed treatment for conditions other than malignant neoplasm: Secondary | ICD-10-CM

## 2011-04-25 DIAGNOSIS — C712 Malignant neoplasm of temporal lobe: Secondary | ICD-10-CM

## 2011-04-25 LAB — COMPREHENSIVE METABOLIC PANEL
AST: 17 U/L (ref 0–37)
Albumin: 3.8 g/dL (ref 3.5–5.2)
Alkaline Phosphatase: 86 U/L (ref 39–117)
BUN: 28 mg/dL — ABNORMAL HIGH (ref 6–23)
Creatinine, Ser: 1.11 mg/dL (ref 0.50–1.35)
Potassium: 3.6 mEq/L (ref 3.5–5.3)
Total Bilirubin: 0.2 mg/dL — ABNORMAL LOW (ref 0.3–1.2)

## 2011-04-25 LAB — CBC WITH DIFFERENTIAL/PLATELET
Basophils Absolute: 0 10*3/uL (ref 0.0–0.1)
EOS%: 0.6 % (ref 0.0–7.0)
HGB: 10.2 g/dL — ABNORMAL LOW (ref 13.0–17.1)
LYMPH%: 26.7 % (ref 14.0–49.0)
MCH: 35.5 pg — ABNORMAL HIGH (ref 27.2–33.4)
MCV: 103 fL — ABNORMAL HIGH (ref 79.3–98.0)
MONO%: 15.8 % — ABNORMAL HIGH (ref 0.0–14.0)
Platelets: 128 10*3/uL — ABNORMAL LOW (ref 140–400)
RDW: 14.7 % — ABNORMAL HIGH (ref 11.0–14.6)

## 2011-04-25 MED ORDER — CELECOXIB 200 MG PO CAPS: 200.0000 mg | ORAL_CAPSULE | Freq: Two times a day (BID) | ORAL | Status: DC | PRN

## 2011-04-25 NOTE — Progress Notes (Signed)
OFFICE PROGRESS NOTE   INTERVAL HISTORY:   He returns as scheduled. He has no new complaint. He reports improvement in the right arm pain and range of motion after a steroid injection. Dr.Gramig has prescribed a physical therapy program.  He reports no new neurologic symptoms. The visual field deficit is unchanged.  He completed vaccine therapy at [facility] on 04/17/2011. He is scheduled to begin another cycle of chemotherapy Xeloda might today.  Objective:  Vital signs in last 24 hours:  Blood pressure 114/70, pulse 84, temperature 97.1 F (36.2 C), temperature source Oral, height 5' 7.5" (1.715 m), weight 181 lb 11.2 oz (82.419 kg).    HEENT: Mild thrush at the posterior left buccal mucosa. Resp: Lungs clear bilaterally Cardio: Regular rate and rhythm GI: Nontender, no hepatomegaly Vascular: The left lower leg is slightly larger than the right side, no edema, no erythema Neuro: Alert and oriented. Profound weakness of the right arm and hand. 3-4/5 strength with internal/external rotation at the right foot. Stable right visual field deficit.  Lab Results:  Lab Results  Component Value Date   WBC 4.7 04/25/2011   HGB 10.2* 04/25/2011   HCT 29.5* 04/25/2011   MCV 103.0* 04/25/2011   PLT 128* 04/25/2011   ANC 2.6    Medications: I have reviewed the patient's current medications.  Assessment/Plan: 1. Glioblastoma multiforme. An MRI of the brain during a hospital admission on 03/27/2010 was compared to an MRI from [place] on 02/22/2010. There was an increase in the abnormal signal of the left posterior opercular region and periatrial region, concerning for progression of tumor. Review of the MRI by Dr. Ferdinand Lango at [facility] confirmed disease progression. Avastin was resumed on 05/28/2010. Hydroxyurea was initiated on 06/07/2010. The hydroxyurea was placed on hold on 06/27/2010 due to leukopenia and thrombocytopenia.  a. Initiation of salvage therapy with temozolomide and the Celldex vaccine beginning  on 08/16/2010.  b. MRI of the brain at [facility] on 10/31/2010 is concerning for disease progression with an interval increase in enhancement surrounding the resection cavity and inferior margin of the putamen and left optic tract. c. MRI of the brain at [facility] on 11/28/2010 showed a slight interval increase in peripheral nodular enhancement surrounding the resection cavity d. He underwent a repeat MRI of the brain at [facility] on 12/26/2010-this showed a stable appearance of the left temporal lobe resection cavity and marginal enhancement. e. Restaging MRI of the brain 12/26/2010 at [facility] with a slight interval decrease in the size of residual left temporal lobe tumor f. Restaging MRI of the brain at [facility] on 02/20/2011 revealed a slight interval decrease in the residual left temporal lobe tumor 2. 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 3. Right visual field deficit secondary to progression of the glioblastoma multiforme, stable today. 4. 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 5. Hypothyroidism, maintained on Synthroid. 6. Hypertension, essential hypertension and an effect of Avastin. 7. History of nephrotic syndrome secondary to Avastin. 8. History of progressive thrombocytopenia, status post a bone marrow biopsy on 04/26/2010 with findings of a hypocellular marrow with decreased megakaryocytes and a left shift in the myeloid and erythroid series and minimal dyspoiesis. Cytogenetics returned normal. He again developed thrombocytopenia after receiving Avastin and beginning hydroxyurea in April 2012. The platelet count has improved since the Avastin and hydroxyurea were discontinued. 9. Vitamin B12 deficiency. 10. Acute diverticulitis in April 2012, improved with ciprofloxacin and Flagyl. Related to Avastin (?).  11. Adhesive  capsulitis of the right shoulder, followed by Dr. Amedeo Plenty. He now takes  OxyContin/oxycodone for relief of pain. Currently undergoing a physical therapy program and steroid injection therapy 12. 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 13. Low serum testosterone level on labs requested by Dr. Ferdinand Lango in November [place] has prescribed testosterone replacement.    Disposition:  He appears stable. The plan is to continue monthly chemotherapy Sulamyd beginning with another cycle today. He continues to vaccine therapy at [facility]. We will followup on the most recent brain MRI from [facility]. He will return for an office visit in one month. He would like to discontinue some of his medications. He will stop Zoloft. He will discuss discontinuing Celebrex with Dr. Amedeo Plenty.   Electa Sniff, MD  04/25/2011  9:14 PM

## 2011-05-02 ENCOUNTER — Telehealth: Payer: Self-pay | Admitting: *Deleted

## 2011-05-02 NOTE — Telephone Encounter (Signed)
Call from pt reporting cramping in B feet and legs. Has noticed this for a few days, happens mostly at night. Asking if he should take something for this. Reviewed with Dr. Truett Perna: monitor for now, call if this persists or worsens. Pt verbalized understanding.

## 2011-05-10 ENCOUNTER — Telehealth: Payer: Self-pay | Admitting: *Deleted

## 2011-05-10 NOTE — Telephone Encounter (Signed)
Call from pt reporting he has been unable to sleep. Requesting refill on sleep medicine. Pt unsure of which med has helped him sleep in the past.

## 2011-05-10 NOTE — Telephone Encounter (Signed)
Left message on voicemail for pt's wife to call office. Per Dr. Truett Perna, pt may take Klonopin as needed for sleep.

## 2011-05-15 ENCOUNTER — Telehealth: Payer: Self-pay | Admitting: *Deleted

## 2011-05-15 NOTE — Telephone Encounter (Signed)
Faxed labs from 04/25/11 to [facility] attn [facility] [phone].

## 2011-05-17 ENCOUNTER — Other Ambulatory Visit: Payer: Self-pay | Admitting: *Deleted

## 2011-05-17 DIAGNOSIS — C719 Malignant neoplasm of brain, unspecified: Secondary | ICD-10-CM

## 2011-05-17 MED ORDER — OXYCODONE HCL 10 MG PO TB12: 10.0000 mg | ORAL_TABLET | Freq: Two times a day (BID) | ORAL | Status: DC

## 2011-05-17 NOTE — Telephone Encounter (Signed)
Call from pt requesting refill on Oxycontin. Rx at front desk for pick up.

## 2011-05-21 ENCOUNTER — Ambulatory Visit (HOSPITAL_BASED_OUTPATIENT_CLINIC_OR_DEPARTMENT_OTHER): Payer: PRIVATE HEALTH INSURANCE | Admitting: Oncology

## 2011-05-21 ENCOUNTER — Ambulatory Visit (HOSPITAL_BASED_OUTPATIENT_CLINIC_OR_DEPARTMENT_OTHER): Payer: Medicare Other

## 2011-05-21 ENCOUNTER — Other Ambulatory Visit: Payer: Self-pay | Admitting: *Deleted

## 2011-05-21 ENCOUNTER — Telehealth: Payer: Self-pay | Admitting: Oncology

## 2011-05-21 VITALS — BP 105/69 | HR 80 | Temp 96.8°F | Ht 67.5 in | Wt 178.2 lb

## 2011-05-21 DIAGNOSIS — C719 Malignant neoplasm of brain, unspecified: Secondary | ICD-10-CM

## 2011-05-21 LAB — COMPREHENSIVE METABOLIC PANEL
ALT: 23 U/L (ref 0–53)
AST: 20 U/L (ref 0–37)
Albumin: 3.8 g/dL (ref 3.5–5.2)
Alkaline Phosphatase: 84 U/L (ref 39–117)
Glucose, Bld: 118 mg/dL — ABNORMAL HIGH (ref 70–99)
Potassium: 4.1 mEq/L (ref 3.5–5.3)
Sodium: 136 mEq/L (ref 135–145)
Total Protein: 6.7 g/dL (ref 6.0–8.3)

## 2011-05-21 LAB — CBC WITH DIFFERENTIAL/PLATELET
Basophils Absolute: 0 10*3/uL (ref 0.0–0.1)
EOS%: 1.2 % (ref 0.0–7.0)
Eosinophils Absolute: 0 10*3/uL (ref 0.0–0.5)
HGB: 9.6 g/dL — ABNORMAL LOW (ref 13.0–17.1)
MCH: 36.1 pg — ABNORMAL HIGH (ref 27.2–33.4)
NEUT#: 1.5 10*3/uL (ref 1.5–6.5)
RDW: 14.4 % (ref 11.0–14.6)
lymph#: 0.8 10*3/uL — ABNORMAL LOW (ref 0.9–3.3)

## 2011-05-21 MED ORDER — TEMOZOLOMIDE 250 MG PO CAPS: 250.0000 mg | ORAL_CAPSULE | Freq: Every day | ORAL | Status: DC

## 2011-05-21 NOTE — Progress Notes (Signed)
OFFICE PROGRESS NOTE   INTERVAL HISTORY:   He returns as scheduled. He completed another vaccine treatment on 05/15/2011. No new neurologic symptoms. He is scheduled to begin another cycle of temozolomide this week. He had an episode of vomiting during the last cycle of temozolomide treatment. This was after eating a [place] and others were also became ill.  Objective:  Vital signs in last 24 hours:  Blood pressure 105/69, pulse 80, temperature 96.8 F (36 C), temperature source Oral, height 5' 7.5" (1.715 m), weight 178 lb 3.2 oz (80.831 kg).    HEENT: No thrush or ulcers Resp: Lungs clear bilaterally Cardio: Regular rate and rhythm GI: Nontender, no hepatomegaly Vascular: No leg edema Neuro: Alert and oriented, 3/5 strength at the right hand, 3-4/5 strength at the right foot with medial and dorsal flexion . Stable right visual field deficit  Lab Results:  Lab Results  Component Value Date   WBC 2.6* 05/21/2011   HGB 9.6* 05/21/2011   HCT 28.2* 05/21/2011   MCV 106.0* 05/21/2011   PLT 106* 05/21/2011   ANC 1.5   Medications: I have reviewed the patient's current medications.  Assessment/Plan: 1. Glioblastoma multiforme. An MRI of the brain during a hospital admission on 03/27/2010 was compared to an MRI from [place] on 02/22/2010. There was an increase in the abnormal signal of the left posterior opercular region and periatrial region, concerning for progression of tumor. Review of the MRI by Dr. Ferdinand Lango at [facility] confirmed disease progression. Avastin was resumed on 05/28/2010. Hydroxyurea was initiated on 06/07/2010. The hydroxyurea was placed on hold on 06/27/2010 due to leukopenia and thrombocytopenia.  a. Initiation of salvage therapy with temozolomide and the Celldex vaccine beginning on 08/16/2010.  b. MRI of the brain at [facility] on 10/31/2010 is concerning for disease progression with an interval increase in enhancement surrounding the resection cavity and inferior margin of  the putamen and left optic tract. c. MRI of the brain at [facility] on 11/28/2010 showed a slight interval increase in peripheral nodular enhancement surrounding the resection cavity d. He underwent a repeat MRI of the brain at [facility] on 12/26/2010-this showed a stable appearance of the left temporal lobe resection cavity and marginal enhancement. e. Restaging MRI of the brain 12/26/2010 at [facility] with a slight interval decrease in the size of residual left temporal lobe tumor f. Restaging MRI of the brain at [facility] on 02/20/2011 revealed a slight interval decrease in the residual left temporal lobe tumor g. Restaging MRI of the brain at [facility] on 04/03/2011 revealed stable disease. 2. 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 3. Right visual field deficit secondary to progression of the glioblastoma multiforme, stable today. 4. 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 5. Hypothyroidism, maintained on Synthroid. 6. Hypertension, essential hypertension and an effect of Avastin. 7. History of nephrotic syndrome secondary to Avastin. 8. History of progressive thrombocytopenia, status post a bone marrow biopsy on 04/26/2010 with findings of a hypocellular marrow with decreased megakaryocytes and a left shift in the myeloid and erythroid series and minimal dyspoiesis. Cytogenetics returned normal. He again developed thrombocytopenia after receiving Avastin and beginning hydroxyurea in April 2012. The platelet count has improved since the Avastin and hydroxyurea were discontinued. 9. Vitamin B12 deficiency. 10. Acute diverticulitis in April 2012, improved with ciprofloxacin and Flagyl. Related to Avastin (?).  11. Adhesive capsulitis of the right shoulder, followed by Dr. Amedeo Plenty. He now takes OxyContin/oxycodone for relief of pain.  Currently undergoing a physical therapy program and steroid injection  therapy 12. 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 13. Low serum testosterone level on labs requested by Dr. Ferdinand Lango in November [place] has prescribed testosterone replacement.   Disposition:  He appears unchanged. He will begin another cycle of temozolomide on 05/23/2011. He will return for an office and lab visit in one month. He is scheduled for a repeat MRI at [facility] next month.   Electa Sniff, MD  05/21/2011  5:32 PM

## 2011-05-21 NOTE — Telephone Encounter (Signed)
appt made and printed  aom 

## 2011-05-23 ENCOUNTER — Telehealth: Payer: Self-pay | Admitting: *Deleted

## 2011-05-23 NOTE — Telephone Encounter (Signed)
Patient asking if Dr. Truett Perna would allow him to decrease his Keppra dose from 750 mg to 500 mg bid? No, per Dr. Dietrich Pates risk. Patient notified. He also has been pain free and would like to see if he can come off some of his pain meds. Will try to omit the 1 am dose of Oxycontin for week or so and see how it goes.

## 2011-06-05 ENCOUNTER — Other Ambulatory Visit: Payer: Self-pay | Admitting: *Deleted

## 2011-06-06 ENCOUNTER — Other Ambulatory Visit: Payer: Self-pay | Admitting: *Deleted

## 2011-06-06 ENCOUNTER — Ambulatory Visit: Payer: Medicare Other

## 2011-06-06 DIAGNOSIS — C712 Malignant neoplasm of temporal lobe: Secondary | ICD-10-CM

## 2011-06-06 MED ORDER — OXYCODONE-ACETAMINOPHEN 5-325 MG PO TABS: 1.0000 | ORAL_TABLET | ORAL | Status: DC | PRN

## 2011-06-06 NOTE — Telephone Encounter (Signed)
Also reports feeling very sluggish and weak. Asking to check counts today when he is in to pick up script.

## 2011-06-07 ENCOUNTER — Ambulatory Visit (HOSPITAL_BASED_OUTPATIENT_CLINIC_OR_DEPARTMENT_OTHER): Payer: Medicare Other | Admitting: Lab

## 2011-06-07 DIAGNOSIS — C712 Malignant neoplasm of temporal lobe: Secondary | ICD-10-CM

## 2011-06-07 LAB — CBC WITH DIFFERENTIAL/PLATELET
Basophils Absolute: 0 10*3/uL (ref 0.0–0.1)
Eosinophils Absolute: 0 10*3/uL (ref 0.0–0.5)
HGB: 9.9 g/dL — ABNORMAL LOW (ref 13.0–17.1)
MCV: 116.4 fL — ABNORMAL HIGH (ref 79.3–98.0)
MONO%: 13.3 % (ref 0.0–14.0)
NEUT#: 1.7 10*3/uL (ref 1.5–6.5)
Platelets: 84 10*3/uL — ABNORMAL LOW (ref 140–400)
RDW: 15.1 % — ABNORMAL HIGH (ref 11.0–14.6)

## 2011-06-10 ENCOUNTER — Other Ambulatory Visit: Payer: Self-pay | Admitting: *Deleted

## 2011-06-10 DIAGNOSIS — C719 Malignant neoplasm of brain, unspecified: Secondary | ICD-10-CM

## 2011-06-10 NOTE — Telephone Encounter (Signed)
CLONAZEPAM REFILL IS EARLY. NOTIFIED PHARMACY. THE PHARMACY WILL CHECK WITH PT.'S WIFE.

## 2011-06-12 ENCOUNTER — Encounter: Payer: Self-pay | Admitting: *Deleted

## 2011-06-18 ENCOUNTER — Telehealth: Payer: Self-pay | Admitting: *Deleted

## 2011-06-18 NOTE — Telephone Encounter (Signed)
Prior authorization expired on 06/06/11 for his Temodar. Need to call #[phone] to renew. Message forwarded to [facility] in managed care dept.

## 2011-06-18 NOTE — Telephone Encounter (Signed)
Pt. Called to ask if he should r/s his follow up appt.  06/19/11.  Per Dr. Truett Perna - Its fine to leave it like it is.  Will also do lab work with this appt.  Pt. Is fine wilh this and will come tomorrow.

## 2011-06-19 ENCOUNTER — Ambulatory Visit (HOSPITAL_BASED_OUTPATIENT_CLINIC_OR_DEPARTMENT_OTHER): Payer: Medicare Other | Admitting: Oncology

## 2011-06-19 ENCOUNTER — Telehealth: Payer: Self-pay | Admitting: Oncology

## 2011-06-19 ENCOUNTER — Other Ambulatory Visit (HOSPITAL_BASED_OUTPATIENT_CLINIC_OR_DEPARTMENT_OTHER): Payer: Medicare Other

## 2011-06-19 ENCOUNTER — Encounter: Payer: Self-pay | Admitting: Oncology

## 2011-06-19 ENCOUNTER — Ambulatory Visit (HOSPITAL_COMMUNITY)
Admission: RE | Admit: 2011-06-19 | Discharge: 2011-06-19 | Disposition: A | Discharge status: Home / Self Care | Payer: Medicare Other | Source: Ambulatory Visit | Attending: Oncology | Admitting: Oncology

## 2011-06-19 ENCOUNTER — Telehealth: Payer: Self-pay | Admitting: *Deleted

## 2011-06-19 VITALS — BP 107/61 | HR 71 | Temp 97.0°F | Ht 67.5 in | Wt 173.8 lb

## 2011-06-19 DIAGNOSIS — C711 Malignant neoplasm of frontal lobe: Secondary | ICD-10-CM

## 2011-06-19 DIAGNOSIS — M7989 Other specified soft tissue disorders: Secondary | ICD-10-CM | POA: Insufficient documentation

## 2011-06-19 DIAGNOSIS — C719 Malignant neoplasm of brain, unspecified: Secondary | ICD-10-CM

## 2011-06-19 DIAGNOSIS — M79609 Pain in unspecified limb: Secondary | ICD-10-CM | POA: Insufficient documentation

## 2011-06-19 DIAGNOSIS — F4321 Adjustment disorder with depressed mood: Secondary | ICD-10-CM

## 2011-06-19 DIAGNOSIS — R252 Cramp and spasm: Secondary | ICD-10-CM

## 2011-06-19 DIAGNOSIS — D61818 Other pancytopenia: Secondary | ICD-10-CM

## 2011-06-19 LAB — CBC WITH DIFFERENTIAL/PLATELET
Basophils Absolute: 0 10*3/uL (ref 0.0–0.1)
Eosinophils Absolute: 0.1 10*3/uL (ref 0.0–0.5)
LYMPH%: 27.6 % (ref 14.0–49.0)
MCV: 105.3 fL — ABNORMAL HIGH (ref 79.3–98.0)
MONO%: 14.8 % — ABNORMAL HIGH (ref 0.0–14.0)
NEUT#: 1.6 10*3/uL (ref 1.5–6.5)
NEUT%: 55.3 % (ref 39.0–75.0)
Platelets: 106 10*3/uL — ABNORMAL LOW (ref 140–400)
RBC: 2.76 10*6/uL — ABNORMAL LOW (ref 4.20–5.82)
nRBC: 0 % (ref 0–0)

## 2011-06-19 LAB — COMPREHENSIVE METABOLIC PANEL
BUN: 19 mg/dL (ref 6–23)
CO2: 23 mEq/L (ref 19–32)
Creatinine, Ser: 0.99 mg/dL (ref 0.50–1.35)
Glucose, Bld: 100 mg/dL — ABNORMAL HIGH (ref 70–99)
Total Bilirubin: 0.3 mg/dL (ref 0.3–1.2)

## 2011-06-19 NOTE — Telephone Encounter (Signed)
appts made and printed for pt ans pt sent to u/s today 11:30   aom

## 2011-06-19 NOTE — Progress Notes (Signed)
*  PRELIMINARY RESULTS* Vascular Ultrasound Lower Extremity Venous Duplex has been completed.  Preliminary findings: Bilaterally no evidence of DVT or baker's cyst.  Farrel Demark, RDMS 06/19/2011, 12:07 PM

## 2011-06-19 NOTE — Progress Notes (Signed)
OFFICE PROGRESS NOTE   INTERVAL HISTORY:   He returns as scheduled. He completed another cycle of temozolomide on 05/23/2011. No new neurologic symptoms. He complains of malaise. He has noted intermittent cramping of the legs. Mild swelling of the left leg.  The range of motion at the right shoulder has improved. He continues physical therapy. A restaging MRI of the brain at [facility] showed no evidence of disease progression. He continues monthly vaccine therapy at [facility].    Objective:  Vital signs in last 24 hours:  Blood pressure 107/61, pulse 71, temperature 97 F (36.1 C), temperature source Oral, height 5' 7.5" (1.715 m), weight 173 lb 12.8 oz (78.835 kg).    HEENT: No thrush or ulcers Resp:  Lungs clear bilaterally Cardio: Regular rate and rhythm GI: No no hepatosplenomegaly Vascular: The left lower leg is slightly larger than the right side with a slight increase in warmth. No erythema. No tenderness. A few varicosities over the left calf. Neuro: Alert and oriented. Stable weakness of the right arm   Lab Results:  Lab Results  Component Value Date   WBC 2.8* 06/19/2011   HGB 9.9* 06/19/2011   HCT 29.0* 06/19/2011   MCV 105.3* 06/19/2011   PLT 106* 06/19/2011   ANC 1.6   Medications: I have reviewed the patient's current medications.  Assessment/Plan: 1. Glioblastoma multiforme. An MRI of the brain during a hospital admission on 03/27/2010 was compared to an MRI from [place] on 02/22/2010. There was an increase in the abnormal signal of the left posterior opercular region and periatrial region, concerning for progression of tumor. Review of the MRI by Dr. Ferdinand Lango at [facility] confirmed disease progression. Avastin was resumed on 05/28/2010. Hydroxyurea was initiated on 06/07/2010. The hydroxyurea was placed on hold on 06/27/2010 due to leukopenia and thrombocytopenia.  a. Initiation of salvage therapy with temozolomide and the Celldex vaccine beginning on 08/16/2010.  b. MRI of the brain  at [facility] on 10/31/2010 is concerning for disease progression with an interval increase in enhancement surrounding the resection cavity and inferior margin of the putamen and left optic tract. c. MRI of the brain at [facility] on 11/28/2010 showed a slight interval increase in peripheral nodular enhancement surrounding the resection cavity d. He underwent a repeat MRI of the brain at [facility] on 12/26/2010-this showed a stable appearance of the left temporal lobe resection cavity and marginal enhancement. e. Restaging MRI of the brain 12/26/2010 at [facility] with a slight interval decrease in the size of residual left temporal lobe tumor f. Restaging MRI of the brain at [facility] on 02/20/2011 revealed a slight interval decrease in the residual left temporal lobe tumor g. Restaging MRI of the brain at [facility] on 04/03/2011 revealed stable disease. 2. 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 3. Right visual field deficit secondary to progression of the glioblastoma multiforme, stable today. 4. 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 5. Hypothyroidism, maintained on Synthroid. 6. Hypertension, essential hypertension and an effect of Avastin. 7. History of nephrotic syndrome secondary to Avastin. 8. History of progressive thrombocytopenia, status post a bone marrow biopsy on 04/26/2010 with findings of a hypocellular marrow with decreased megakaryocytes and a left shift in the myeloid and erythroid series and minimal dyspoiesis. Cytogenetics returned normal. He again developed thrombocytopenia after receiving Avastin and beginning hydroxyurea in April 2012. The platelet count has improved since the Avastin and hydroxyurea were discontinued. Mild thrombocytopenia today is likely related to temozolomide. 9.  Vitamin B12 deficiency. 10. Acute diverticulitis in April 2012, improved with ciprofloxacin and Flagyl. Related to Avastin  (?).  11. Adhesive capsulitis of the right shoulder, followed by Dr. Amedeo Plenty. He now takes OxyContin/oxycodone for relief of pain. Currently undergoing a physical therapy program and steroid injection therapy 12. 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 13. Low serum testosterone level on labs requested by Dr. Ferdinand Lango in November [place] has prescribed testosterone replacement.    Disposition:  He appears stable from a neurologic standpoint. He is scheduled to begin a final cycle of temozolomide chemotherapy beginning on 06/22/2011. He continues monthly vaccine therapy he did. He is scheduled for a restaging PET scan at [facility] next month.  The anemia and thrombocytopenia are likely related to a cumulative effect of treatment with temozolomide.  He has mild swelling of the left leg and complains of cramping in the leg bilaterally. He'll be referred for a Doppler of the legs due to the high risk of developing venous thrombosis.  He continues physical therapy with Dr. Amedeo Plenty.   Betsy Coder, MD  06/19/2011  2:25 PM

## 2011-06-19 NOTE — Progress Notes (Signed)
[place] @ [phone] about temodar pa; they do not approve patients to receive temodar past 10 months, unless their MD approves it  Rep will fax me a medical necessity form for D.r Truett Perna to fill out for an extention.

## 2011-06-19 NOTE — Telephone Encounter (Signed)
Received call report, pt was negative for DVT bilaterally.

## 2011-06-20 ENCOUNTER — Telehealth: Payer: Self-pay | Admitting: *Deleted

## 2011-06-20 NOTE — Telephone Encounter (Signed)
Leg cramps continue-worse at night. Asking if K+ level was OK? Made him aware that his K+ level was normal and per MD he does not need K+ supplement. Doppler study was also negative. Suggested he push po fluids, do gentle stretches and try tonic water 1 oz daily (old home remedy).

## 2011-06-21 ENCOUNTER — Other Ambulatory Visit: Payer: Self-pay | Admitting: *Deleted

## 2011-06-21 DIAGNOSIS — C719 Malignant neoplasm of brain, unspecified: Secondary | ICD-10-CM

## 2011-06-21 MED ORDER — ONDANSETRON HCL 8 MG PO TABS: 8.0000 mg | ORAL_TABLET | ORAL | Status: DC

## 2011-06-21 NOTE — Telephone Encounter (Signed)
Made patient aware that we are still working on his prior authorization.

## 2011-06-21 NOTE — Telephone Encounter (Signed)
Call from pharmacy that patient needs prior authorization for his Temodar. Made them aware that we are already aware of this and managed care is working on the prior authorization. All necessary chart information has been sent by Axel Filler.

## 2011-06-24 ENCOUNTER — Other Ambulatory Visit: Payer: Self-pay | Admitting: *Deleted

## 2011-06-24 DIAGNOSIS — C719 Malignant neoplasm of brain, unspecified: Secondary | ICD-10-CM

## 2011-06-24 DIAGNOSIS — C712 Malignant neoplasm of temporal lobe: Secondary | ICD-10-CM

## 2011-06-24 MED ORDER — CLONIDINE HCL 0.1 MG PO TABS: 0.1000 mg | ORAL_TABLET | Freq: Two times a day (BID) | ORAL | Status: DC

## 2011-06-24 MED ORDER — OXYCODONE HCL 10 MG PO TB12: 10.0000 mg | ORAL_TABLET | Freq: Two times a day (BID) | ORAL | Status: DC

## 2011-06-25 ENCOUNTER — Telehealth: Payer: Self-pay

## 2011-06-25 NOTE — Telephone Encounter (Signed)
Multiple calls made to [facility] by MD and RN for requested peer to peer review for approval of pt's Temodar.   Received VM from unnamed male at [facility] at 1500 today stating that pt's case has been marked "pending" as it is currently under review by the Medical Director there.  (Case had previously been considered "closed" when denied) Their office will contact our office when review is complete.    dph

## 2011-06-26 ENCOUNTER — Telehealth: Payer: Self-pay | Admitting: *Deleted

## 2011-06-26 NOTE — Telephone Encounter (Signed)
Message from pt reporting his Temodar authorization has been resolved. He should receive medication on 06/27/11 and will begin on 06/27/11 for 5 days.

## 2011-06-27 ENCOUNTER — Other Ambulatory Visit: Payer: Self-pay | Admitting: Nurse Practitioner

## 2011-06-27 ENCOUNTER — Ambulatory Visit (HOSPITAL_BASED_OUTPATIENT_CLINIC_OR_DEPARTMENT_OTHER): Payer: Medicare Other

## 2011-06-27 DIAGNOSIS — C719 Malignant neoplasm of brain, unspecified: Secondary | ICD-10-CM

## 2011-06-27 LAB — CBC WITH DIFFERENTIAL/PLATELET
BASO%: 0.3 % (ref 0.0–2.0)
HCT: 29.8 % — ABNORMAL LOW (ref 38.4–49.9)
MCHC: 33.9 g/dL (ref 32.0–36.0)
MONO#: 0.4 10*3/uL (ref 0.1–0.9)
NEUT%: 58.5 % (ref 39.0–75.0)
RDW: 13.3 % (ref 11.0–14.6)
WBC: 3.6 10*3/uL — ABNORMAL LOW (ref 4.0–10.3)
lymph#: 1 10*3/uL (ref 0.9–3.3)
nRBC: 0 % (ref 0–0)

## 2011-06-30 ENCOUNTER — Other Ambulatory Visit: Payer: Self-pay | Admitting: Oncology

## 2011-07-12 ENCOUNTER — Other Ambulatory Visit: Payer: Self-pay | Admitting: Internal Medicine

## 2011-07-15 ENCOUNTER — Other Ambulatory Visit: Payer: Self-pay | Admitting: Internal Medicine

## 2011-07-19 ENCOUNTER — Telehealth: Payer: Self-pay | Admitting: Oncology

## 2011-07-19 ENCOUNTER — Other Ambulatory Visit: Payer: Self-pay | Admitting: *Deleted

## 2011-07-19 NOTE — Telephone Encounter (Signed)
called pt and informed him that his appt on 06/06 was moved to 06/05.

## 2011-07-24 ENCOUNTER — Ambulatory Visit (HOSPITAL_COMMUNITY)
Admission: RE | Admit: 2011-07-24 | Discharge: 2011-07-24 | Disposition: A | Discharge status: Home / Self Care | Payer: Medicare Other | Source: Ambulatory Visit | Attending: Oncology | Admitting: Oncology

## 2011-07-24 ENCOUNTER — Telehealth: Payer: Self-pay | Admitting: *Deleted

## 2011-07-24 ENCOUNTER — Other Ambulatory Visit: Payer: Medicare Other | Admitting: Lab

## 2011-07-24 ENCOUNTER — Other Ambulatory Visit: Payer: Self-pay | Admitting: *Deleted

## 2011-07-24 ENCOUNTER — Other Ambulatory Visit (HOSPITAL_BASED_OUTPATIENT_CLINIC_OR_DEPARTMENT_OTHER): Payer: Medicare Other | Admitting: Lab

## 2011-07-24 ENCOUNTER — Ambulatory Visit: Payer: Medicare Other | Admitting: Oncology

## 2011-07-24 ENCOUNTER — Ambulatory Visit (HOSPITAL_BASED_OUTPATIENT_CLINIC_OR_DEPARTMENT_OTHER): Payer: Medicare Other | Admitting: Oncology

## 2011-07-24 ENCOUNTER — Telehealth: Payer: Self-pay | Admitting: Oncology

## 2011-07-24 VITALS — BP 116/77 | HR 103 | Temp 97.4°F | Ht 67.5 in | Wt 169.2 lb

## 2011-07-24 DIAGNOSIS — R062 Wheezing: Secondary | ICD-10-CM | POA: Insufficient documentation

## 2011-07-24 DIAGNOSIS — R05 Cough: Secondary | ICD-10-CM | POA: Insufficient documentation

## 2011-07-24 DIAGNOSIS — C719 Malignant neoplasm of brain, unspecified: Secondary | ICD-10-CM

## 2011-07-24 DIAGNOSIS — C711 Malignant neoplasm of frontal lobe: Secondary | ICD-10-CM

## 2011-07-24 DIAGNOSIS — R059 Cough, unspecified: Secondary | ICD-10-CM | POA: Insufficient documentation

## 2011-07-24 LAB — CBC WITH DIFFERENTIAL/PLATELET
Basophils Absolute: 0 10*3/uL (ref 0.0–0.1)
Eosinophils Absolute: 0.1 10*3/uL (ref 0.0–0.5)
HGB: 11 g/dL — ABNORMAL LOW (ref 13.0–17.1)
MCV: 105.2 fL — ABNORMAL HIGH (ref 79.3–98.0)
MONO#: 0.4 10*3/uL (ref 0.1–0.9)
NEUT#: 2.5 10*3/uL (ref 1.5–6.5)
RBC: 3.07 10*6/uL — ABNORMAL LOW (ref 4.20–5.82)
RDW: 13.3 % (ref 11.0–14.6)
WBC: 3.6 10*3/uL — ABNORMAL LOW (ref 4.0–10.3)
lymph#: 0.6 10*3/uL — ABNORMAL LOW (ref 0.9–3.3)
nRBC: 0 % (ref 0–0)

## 2011-07-24 LAB — COMPREHENSIVE METABOLIC PANEL
ALT: 22 U/L (ref 0–53)
Albumin: 4.1 g/dL (ref 3.5–5.2)
CO2: 21 mEq/L (ref 19–32)
Chloride: 109 mEq/L (ref 96–112)
Glucose, Bld: 92 mg/dL (ref 70–99)
Potassium: 3.4 mEq/L — ABNORMAL LOW (ref 3.5–5.3)
Sodium: 140 mEq/L (ref 135–145)
Total Protein: 6.5 g/dL (ref 6.0–8.3)

## 2011-07-24 MED ORDER — ALBUTEROL SULFATE HFA 108 (90 BASE) MCG/ACT IN AERS: 2.0000 | INHALATION_SPRAY | Freq: Four times a day (QID) | RESPIRATORY_TRACT | Status: DC | PRN

## 2011-07-24 MED ORDER — LEVOFLOXACIN 500 MG PO TABS: 500.0000 mg | ORAL_TABLET | Freq: Every day | ORAL | Status: DC

## 2011-07-24 MED ORDER — SERTRALINE HCL 50 MG PO TABS: 50.0000 mg | ORAL_TABLET | Freq: Every day | ORAL | Status: DC

## 2011-07-24 NOTE — Patient Instructions (Signed)
You expressed desire to attempt to wean off you long acting pain medication. Per Dr. Truett Perna: take Oxycontin 10 mg every am and hold pm dose. If you develop sweats or shakes at night, take a dose of Percocet. Continue this regimen X 1 week. Call office on 6/12 and report progress to nurse. Due to chest wheezing and cough, have CXR today on the way out. Start Levaquin 500 mg daily X 7 days and use albuterol MDI #2 puffs every 6 hours as needed. Call for fever or shortness of breath.

## 2011-07-24 NOTE — Telephone Encounter (Signed)
gv pt appt for [id number].  sent pt to [facility] for CXR

## 2011-07-24 NOTE — Progress Notes (Signed)
[place]    OFFICE PROGRESS NOTE   INTERVAL HISTORY:   He returns as scheduled. He completed another cycle of temozolomide beginning on 06/27/2011. No new neurologic symptoms. The right shoulder pain has improved. He would like to wean off of the OxyContin. When he missed OxyContin and Zoloft 1 day last week he developed sweats. The sweats resolved within 1 hour of resuming these medications.  He underwent a "PET "skin at [place] last week and reports there was no evidence of a hypermetabolic brain tumor (we have not received a report). He continues monthly vaccine therapy on the [facility] protocol.  Mr. Balles reports a cough and wheezing for the past 3 days. No fever. Mild shortness of breath.  Objective:  Vital signs in last 24 hours:  Blood pressure 116/77, pulse 103, temperature 97.4 F (36.3 C), temperature source Oral, height 5' 7.5" (1.715 m), weight 169 lb 3.2 oz (76.749 kg).    HEENT: No thrush or ulcers Resp: Bilateral inspiratory and expiratory rhonchi/or wheezes, no respiratory distress Cardio: Regular rate and rhythm GI: No hepatomegaly, nontender Vascular: No leg edema-the left calf musculature is larger than the right side Neuro: Alert and oriented, fluent speech, market weakness of the right arm and hand, decrease strength with inversion/the version at the right foot    Portacath/PICC-without erythema  Lab Results:  Lab Results  Component Value Date   WBC 3.6* 07/24/2011   HGB 11.0* 07/24/2011   HCT 32.3* 07/24/2011   MCV 105.2* 07/24/2011   PLT 79* 07/24/2011   ANC 2.5    Medications: I have reviewed the patient's current medications.  Assessment/Plan: 1. Glioblastoma multiforme. An MRI of the brain during a hospital admission on 03/27/2010 was compared to an MRI from [place] on 02/22/2010. There was an increase in the abnormal signal of the left posterior opercular region and periatrial region, concerning for progression of tumor. Review of the MRI  by Dr. Ferdinand Lango at [facility] confirmed disease progression. Avastin was resumed on 05/28/2010. Hydroxyurea was initiated on 06/07/2010. The hydroxyurea was placed on hold on 06/27/2010 due to leukopenia and thrombocytopenia.  a. Initiation of salvage therapy with temozolomide and the Celldex vaccine beginning on 08/16/2010.  b. MRI of the brain at [facility] on 10/31/2010 is concerning for disease progression with an interval increase in enhancement surrounding the resection cavity and inferior margin of the putamen and left optic tract. c. MRI of the brain at [facility] on 11/28/2010 showed a slight interval increase in peripheral nodular enhancement surrounding the resection cavity d. He underwent a repeat MRI of the brain at [facility] on 12/26/2010-this showed a stable appearance of the left temporal lobe resection cavity and marginal enhancement. e. Restaging MRI of the brain 12/26/2010 at [facility] with a slight interval decrease in the size of residual left temporal lobe tumor f. Restaging MRI of the brain at [facility] on 02/20/2011 revealed a slight interval decrease in the residual left temporal lobe tumor g. Restaging MRI of the brain at [facility] on 04/03/2011 revealed stable disease. h. MRI 06/12/2011 at [facility] with stable left temporal resection cavity with decreased peripheral enhancement i. He completed a final cycle of temozolomide beginning on 06/27/11 and continues monthly vaccine therapy 2. 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 3. Right visual field deficit secondary to progression of the glioblastoma multiforme 4. 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 5. Hypothyroidism, maintained on Synthroid. 6. Hypertension, essential hypertension and an effect  of Avastin. 7. History of nephrotic syndrome secondary to Avastin. 8. History of progressive thrombocytopenia, status post a bone marrow biopsy on 04/26/2010 with findings of  a hypocellular marrow with decreased megakaryocytes and a left shift in the myeloid and erythroid series and minimal dyspoiesis. Cytogenetics returned normal. He again developed thrombocytopenia after receiving Avastin and beginning hydroxyurea in April 2012. The platelet count has improved since the Avastin and hydroxyurea were discontinued. thrombocytopenia today is likely related to temozolomide. 9. Vitamin B12 deficiency. 10. Acute diverticulitis in April 2012, improved with ciprofloxacin and Flagyl. Related to Avastin (?).  11. Adhesive capsulitis of the right shoulder, followed by Dr. Amedeo Plenty. He now takes OxyContin/oxycodone for relief of pain. Currently undergoing a physical therapy program and steroid injection therapy. The range of motion at the right shoulder has improved and he reports decreased pain. 12. 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 13. Low serum testosterone level on labs requested by Dr. Ferdinand Lango in November [place] has prescribed testosterone replacement.  14. Cough/wheezing-likely a viral upper respiratory infection. Given his immunocompromise state we will obtain a chest x-ray and prescribed a course of Levaquin. He will use an albuterol inhaler as needed.  Disposition:  He has completed one year of monthly temozolomide therapy concurrent with Celldex vaccine. He will continue monthly vaccine treatment. There is no clinical evidence for progression of the glioblastoma.  The OxyContin will be tapered to once daily. He will take and oxycodone in the evening if he feels withdrawal symptoms. He will contact us in one week with a report on his pain and tolerance of decreasing the OxyContin dose. We will taper the OxyContin to off as tolerated.  Mr. Velazquez will return for an office visit in one month.   Betsy Coder, MD  07/24/2011  1:48 PM

## 2011-07-24 NOTE — Telephone Encounter (Signed)
Left VM on cell/home # that CXR was negative. Continue with levaquin and albuteral MDI as ordered. Call with update in 1 week or sooner if indicated.

## 2011-07-25 ENCOUNTER — Encounter: Payer: Self-pay | Admitting: *Deleted

## 2011-07-25 ENCOUNTER — Other Ambulatory Visit: Payer: Medicare Other | Admitting: Lab

## 2011-07-25 ENCOUNTER — Ambulatory Visit: Payer: Medicare Other | Admitting: Oncology

## 2011-08-15 ENCOUNTER — Other Ambulatory Visit: Payer: Self-pay | Admitting: *Deleted

## 2011-08-15 DIAGNOSIS — C719 Malignant neoplasm of brain, unspecified: Secondary | ICD-10-CM

## 2011-08-15 MED ORDER — OXYCODONE HCL 10 MG PO TB12: 10.0000 mg | ORAL_TABLET | Freq: Two times a day (BID) | ORAL | Status: DC

## 2011-08-20 ENCOUNTER — Ambulatory Visit (HOSPITAL_BASED_OUTPATIENT_CLINIC_OR_DEPARTMENT_OTHER): Payer: Medicare Other | Admitting: Lab

## 2011-08-20 DIAGNOSIS — C719 Malignant neoplasm of brain, unspecified: Secondary | ICD-10-CM

## 2011-08-20 LAB — CBC WITH DIFFERENTIAL/PLATELET
BASO%: 0.5 % (ref 0.0–2.0)
Eosinophils Absolute: 0 10*3/uL (ref 0.0–0.5)
LYMPH%: 22.4 % (ref 14.0–49.0)
MCHC: 33.7 g/dL (ref 32.0–36.0)
MONO#: 0.3 10*3/uL (ref 0.1–0.9)
MONO%: 11.3 % (ref 0.0–14.0)
NEUT#: 1.8 10*3/uL (ref 1.5–6.5)
Platelets: 114 10*3/uL — ABNORMAL LOW (ref 140–400)
RBC: 2.89 10*6/uL — ABNORMAL LOW (ref 4.20–5.82)
RDW: 13.4 % (ref 11.0–14.6)
WBC: 2.8 10*3/uL — ABNORMAL LOW (ref 4.0–10.3)

## 2011-08-21 ENCOUNTER — Other Ambulatory Visit: Payer: Medicare Other | Admitting: Lab

## 2011-08-21 ENCOUNTER — Ambulatory Visit (HOSPITAL_BASED_OUTPATIENT_CLINIC_OR_DEPARTMENT_OTHER): Payer: Medicare Other | Admitting: Oncology

## 2011-08-21 ENCOUNTER — Telehealth: Payer: Self-pay | Admitting: Oncology

## 2011-08-21 VITALS — BP 111/68 | HR 67 | Temp 97.4°F | Ht 67.5 in | Wt 166.6 lb

## 2011-08-21 DIAGNOSIS — C719 Malignant neoplasm of brain, unspecified: Secondary | ICD-10-CM

## 2011-08-21 DIAGNOSIS — C711 Malignant neoplasm of frontal lobe: Secondary | ICD-10-CM

## 2011-08-21 NOTE — Progress Notes (Signed)
[place]    OFFICE PROGRESS NOTE   INTERVAL HISTORY:   He returns as scheduled. No new neurologic symptoms. Persistent right arm and right foot weakness. He was able to tolerate a decrease in the OxyContin to once daily. He would like to discontinue OxyContin and other medications as tolerated.  [name] continues monthly vaccine treatment at [facility].  Objective:  Vital signs in last 24 hours:  Blood pressure 111/68, pulse 67, temperature 97.4 F (36.3 C), temperature source Oral, height 5' 7.5" (1.715 m), weight 166 lb 9.6 oz (75.569 kg).   Resp: Lungs clear bilaterally Cardio: Regular rate and rhythm Vascular: The left lower leg is larger than the right side, no edema Neuro: Alert and oriented, 3/5 strength at the right hand, 3/5 strength with dorsi flexion at the right foot , right visual field deficit-stabl  Lab Results:  Lab Results  Component Value Date   WBC 2.8* 08/20/2011   HGB 10.2* 08/20/2011   HCT 30.2* 08/20/2011   MCV 104.5* 08/20/2011   PLT 114* 08/20/2011   ANC 1.8    Medications: I have reviewed the patient's current medications.  Assessment/Plan: 1. Glioblastoma multiforme. An MRI of the brain during a hospital admission on 03/27/2010 was compared to an MRI from [place] on 02/22/2010. There was an increase in the abnormal signal of the left posterior opercular region and periatrial region, concerning for progression of tumor. Review of the MRI by Dr. Ferdinand Lango at [facility] confirmed disease progression. Avastin was resumed on 05/28/2010. Hydroxyurea was initiated on 06/07/2010. The hydroxyurea was placed on hold on 06/27/2010 due to leukopenia and thrombocytopenia.  a. Initiation of salvage therapy with temozolomide and the Celldex vaccine beginning on 08/16/2010.  b. MRI of the brain at [facility] on 10/31/2010 is concerning for disease progression with an interval increase in enhancement surrounding the resection cavity and inferior margin of the putamen and  left optic tract. c. MRI of the brain at [facility] on 11/28/2010 showed a slight interval increase in peripheral nodular enhancement surrounding the resection cavity d. He underwent a repeat MRI of the brain at [facility] on 12/26/2010-this showed a stable appearance of the left temporal lobe resection cavity and marginal enhancement. e. Restaging MRI of the brain 12/26/2010 at [facility] with a slight interval decrease in the size of residual left temporal lobe tumor f. Restaging MRI of the brain at [facility] on 02/20/2011 revealed a slight interval decrease in the residual left temporal lobe tumor g. Restaging MRI of the brain at [facility] on 04/03/2011 revealed stable disease. h. MRI 06/12/2011 at [facility] with stable left temporal resection cavity with decreased peripheral enhancement i. He completed a final cycle of temozolomide beginning on 06/27/11 and continues monthly vaccine therapy j. PET scan on may 29th 2013 at [facility] definite foci of hypermetabolic FDG activity identified, in the region of the irregular nodular MRI enhancement in the inferior left temporal lobe there are no definite foci of hypermetabolic activity, there was low level FDG activity in this location similar to the surrounding nonenhancing left temporal lobe 2. 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 3. Right visual field deficit secondary to progression of the glioblastoma multiforme 4. 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 5. Hypothyroidism, maintained on Synthroid. 6. Hypertension, essential hypertension and an effect of Avastin. Improved 7. History of nephrotic syndrome secondary to Avastin. 8. History of progressive thrombocytopenia, status post a bone marrow biopsy on 04/26/2010 with findings  of a hypocellular marrow with decreased megakaryocytes and a left shift in the myeloid and erythroid series and minimal dyspoiesis. Cytogenetics returned  normal. He again developed thrombocytopenia after receiving Avastin and beginning hydroxyurea in April 2012. The platelet count has improved since the Avastin and hydroxyurea were discontinued.  9. Vitamin B12 deficiency. 10. Acute diverticulitis in April 2012, improved with ciprofloxacin and Flagyl. Related to Avastin (?).  11. Adhesive capsulitis of the right shoulder, followed by Dr. Amedeo Plenty. He now takes OxyContin/oxycodone for relief of pain. Currently undergoing a physical therapy program and steroid injection therapy. The range of motion at the right shoulder has improved and he reports decreased pain. 12. 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 13. Low serum testosterone level on labs requested by Dr. Ferdinand Lango in November [place] has prescribed testosterone replacement.     Disposition:  He appears stable. He continues monthly vaccine treatment at [facility]. The right arm discomfort has improved. We discontinued OxyContin today.  Mr. Mallozzi would like to consolidate his medical regimen as possible. The blood pressure is significantly improved. We discontinued Benicar today.  He will return for an office visit in 3 months.   Betsy Coder, MD  08/21/2011  1:32 PM

## 2011-08-21 NOTE — Telephone Encounter (Signed)
appts made and printed for pt aom °

## 2011-09-09 ENCOUNTER — Other Ambulatory Visit: Payer: Self-pay | Admitting: *Deleted

## 2011-09-09 DIAGNOSIS — C712 Malignant neoplasm of temporal lobe: Secondary | ICD-10-CM

## 2011-09-09 MED ORDER — OXYCODONE-ACETAMINOPHEN 5-325 MG PO TABS: 1.0000 | ORAL_TABLET | ORAL | Status: DC | PRN

## 2011-09-20 ENCOUNTER — Other Ambulatory Visit: Payer: Self-pay | Admitting: Internal Medicine

## 2011-09-20 NOTE — Telephone Encounter (Signed)
Pharmacy called stating the pt is at the pharmacy trying to refill med before he goes out of town, pt had no more refills pt requesting refill of levothyroxine (SYNTHROID, LEVOTHROID) 75 MCG tablet

## 2011-09-24 ENCOUNTER — Other Ambulatory Visit: Payer: Self-pay | Admitting: Medical Oncology

## 2011-09-24 DIAGNOSIS — C712 Malignant neoplasm of temporal lobe: Secondary | ICD-10-CM

## 2011-09-24 MED ORDER — CELECOXIB 200 MG PO CAPS: 200.0000 mg | ORAL_CAPSULE | Freq: Two times a day (BID) | ORAL | Status: DC | PRN

## 2011-09-24 MED ORDER — CLONIDINE HCL 0.1 MG PO TABS: 0.1000 mg | ORAL_TABLET | Freq: Two times a day (BID) | ORAL | Status: DC

## 2011-09-25 ENCOUNTER — Telehealth: Payer: Self-pay | Admitting: *Deleted

## 2011-09-25 NOTE — Telephone Encounter (Signed)
Message from pt reporting he has slowly weaned himself off of Oxycodone. Has been off it completely for about 7 days. Has noticed he has been "dragging" lately. Asking if there is anything he can take for energy. Needs a "boost". Reviewed with Dr. Truett Perna: there is nothing he can take for energy. If this persists, may need to check thyroid levels. Left message on voicemail for pt with this info.

## 2011-09-26 ENCOUNTER — Telehealth: Payer: Self-pay | Admitting: *Deleted

## 2011-09-26 NOTE — Telephone Encounter (Signed)
Call from pt requesting to have thyroid level checked to evaluate his fatigue. Concerned that he may be experiencing withdrawal from Oxycodone. Denies having pain. Was told by a friend of his that there is a medication that can be given to treat addiction to Oxycodone and will increase energy. Asking if this may apply to him? Pt weaned himself off of Oxycodone slowly. Had been on one 5/325 mg tablet daily before discontinuing med. Pt has been off for about 7 days. Pt is not having any sweats, hallucinations, pain or other symptoms suggesting withdrawals. Suggested he call Dr. Amador Cunas to evaluate fatigue as pt is on Synthroid and has been on testosterone. He agrees to do so, will call this office as needed.

## 2011-09-27 ENCOUNTER — Ambulatory Visit (HOSPITAL_BASED_OUTPATIENT_CLINIC_OR_DEPARTMENT_OTHER): Payer: Medicare Other

## 2011-09-27 ENCOUNTER — Telehealth: Payer: Self-pay | Admitting: *Deleted

## 2011-09-27 DIAGNOSIS — C719 Malignant neoplasm of brain, unspecified: Secondary | ICD-10-CM

## 2011-09-27 DIAGNOSIS — C711 Malignant neoplasm of frontal lobe: Secondary | ICD-10-CM

## 2011-09-27 DIAGNOSIS — E039 Hypothyroidism, unspecified: Secondary | ICD-10-CM

## 2011-09-27 LAB — CBC WITH DIFFERENTIAL/PLATELET
BASO%: 0.6 % (ref 0.0–2.0)
Eosinophils Absolute: 0.1 10*3/uL (ref 0.0–0.5)
HCT: 33.2 % — ABNORMAL LOW (ref 38.4–49.9)
MCHC: 33.8 g/dL (ref 32.0–36.0)
MONO#: 0.4 10*3/uL (ref 0.1–0.9)
NEUT#: 2.2 10*3/uL (ref 1.5–6.5)
Platelets: 128 10*3/uL — ABNORMAL LOW (ref 140–400)
RBC: 3.21 10*6/uL — ABNORMAL LOW (ref 4.20–5.82)
WBC: 3.6 10*3/uL — ABNORMAL LOW (ref 4.0–10.3)
lymph#: 0.9 10*3/uL (ref 0.9–3.3)

## 2011-09-27 LAB — TSH: TSH: 0.865 u[IU]/mL (ref 0.350–4.500)

## 2011-09-27 NOTE — Telephone Encounter (Signed)
Call from pt asking if OK to have lab done in this office. Dr. Amador Cunas is on vacation and he is unable to get labs done there.

## 2011-10-01 ENCOUNTER — Telehealth: Payer: Self-pay | Admitting: Nurse Practitioner

## 2011-10-01 NOTE — Telephone Encounter (Signed)
Req. Return call.  To inform pt per Dr. Truett Perna- "CBC ok, tsh is normal, f/u as schedueled."

## 2011-10-02 ENCOUNTER — Telehealth: Payer: Self-pay | Admitting: Medical Oncology

## 2011-10-02 NOTE — Telephone Encounter (Signed)
Pt notified of cbc , tsh and to f/u as scheduled. Pt stated Dr Truett Perna had told him this last night.

## 2011-10-08 ENCOUNTER — Ambulatory Visit: Payer: Medicare Other | Admitting: Internal Medicine

## 2011-10-14 ENCOUNTER — Ambulatory Visit: Payer: Medicare Other | Admitting: Internal Medicine

## 2011-10-23 ENCOUNTER — Other Ambulatory Visit: Payer: Self-pay | Admitting: Internal Medicine

## 2011-10-25 ENCOUNTER — Encounter: Payer: Self-pay | Admitting: Internal Medicine

## 2011-10-25 ENCOUNTER — Ambulatory Visit (INDEPENDENT_AMBULATORY_CARE_PROVIDER_SITE_OTHER): Payer: Medicare Other | Admitting: Internal Medicine

## 2011-10-25 VITALS — BP 110/84 | Temp 98.2°F | Wt 174.0 lb

## 2011-10-25 DIAGNOSIS — I10 Essential (primary) hypertension: Secondary | ICD-10-CM

## 2011-10-25 DIAGNOSIS — R569 Unspecified convulsions: Secondary | ICD-10-CM

## 2011-10-25 DIAGNOSIS — C719 Malignant neoplasm of brain, unspecified: Secondary | ICD-10-CM

## 2011-10-25 DIAGNOSIS — C712 Malignant neoplasm of temporal lobe: Secondary | ICD-10-CM

## 2011-10-25 MED ORDER — SERTRALINE HCL 25 MG PO TABS: 25.0000 mg | ORAL_TABLET | Freq: Every day | ORAL | Status: DC

## 2011-10-25 MED ORDER — CELECOXIB 200 MG PO CAPS: 200.0000 mg | ORAL_CAPSULE | Freq: Two times a day (BID) | ORAL | Status: DC | PRN

## 2011-10-25 MED ORDER — ZOLPIDEM TARTRATE ER 6.25 MG PO TBCR: 6.2500 mg | EXTENDED_RELEASE_TABLET | Freq: Every evening | ORAL | Status: DC | PRN

## 2011-10-25 MED ORDER — LEVOTHYROXINE SODIUM 75 MCG PO TABS: 75.0000 ug | ORAL_TABLET | Freq: Every day | ORAL | Status: DC

## 2011-10-25 MED ORDER — LEVETIRACETAM 750 MG PO TABS: 750.0000 mg | ORAL_TABLET | Freq: Two times a day (BID) | ORAL | Status: DC

## 2011-10-25 MED ORDER — ALBUTEROL SULFATE HFA 108 (90 BASE) MCG/ACT IN AERS: 2.0000 | INHALATION_SPRAY | Freq: Four times a day (QID) | RESPIRATORY_TRACT | Status: DC | PRN

## 2011-10-25 NOTE — Progress Notes (Signed)
Subjective:    Patient ID: [name], male    DOB: 06-30-1960, 51 y.o.   MRN: [id number]  HPI  51 year old patient who is followed closely by oncology and also at [facility] for GBM. He is done remarkably well and has recently been tapered off narcotic pain relievers as well as blood pressure medications. He has hypothyroidism and a secondary seizure disorder. He has right-sided weakness and a right homonymous hemianopsia. In general doing quite well and quite upbeat today;  his chief complaint is chronic fatigue  Past Medical History  Diagnosis Date  . ALLERGIC RHINITIS 01/09/2009  . HYPERTENSION 01/09/2009  . SEIZURE DISORDER 01/09/2009  . Glioblastoma multiforme     stage IV  . Thyroid disease     Hypothyroidism    History   Social History  . Marital Status: Married    Spouse Name: N/A    Number of Children: N/A  . Years of Education: N/A   Occupational History  . Not on file.   Social History Main Topics  . Smoking status: Never Smoker   . Smokeless tobacco: Never Used  . Alcohol Use: No  . Drug Use: No  . Sexually Active: Not on file   Other Topics Concern  . Not on file   Social History Narrative  . No narrative on file    Past Surgical History  Procedure Date  . Craniotomy 2009    glioblastoma    History reviewed. No pertinent family history.  No Known Allergies  Current Outpatient Prescriptions on File Prior to Visit  Medication Sig Dispense Refill  . albuterol (VENTOLIN HFA) 108 (90 BASE) MCG/ACT inhaler Inhale 2 puffs into the lungs every 6 (six) hours as needed for wheezing.  1 Inhaler  0  . celecoxib (CELEBREX) 200 MG capsule Take 1 capsule (200 mg total) by mouth 2 (two) times daily between meals as needed.  60 capsule  4  . levETIRAcetam (KEPPRA) 750 MG tablet Take 500 mg by mouth every 12 (twelve) hours.      Marland Kitchen levothyroxine (SYNTHROID, LEVOTHROID) 75 MCG tablet take 1 tablet by mouth once daily  30 tablet  0  . sertraline (ZOLOFT) 25 MG tablet  Take 25 mg by mouth Daily. #30 with refill X 11      . vitamin B-12 (CYANOCOBALAMIN) 1000 MCG tablet Take 500 mcg by mouth daily.       Marland Kitchen zolpidem (AMBIEN CR) 6.25 MG CR tablet Take 6.25 mg by mouth at bedtime as needed.      Marland Kitchen DISCONTD: pantoprazole (PROTONIX) 40 MG tablet Take 40 mg by mouth daily.         BP 110/84  Temp 98.2 F (36.8 C) (Oral)  Wt 174 lb (78.926 kg)     Review of Systems  Constitutional: Positive for fatigue. Negative for fever, chills and appetite change.  HENT: Negative for hearing loss, ear pain, congestion, sore throat, trouble swallowing, neck stiffness, dental problem, voice change and tinnitus.   Eyes: Positive for visual disturbance. Negative for pain and discharge.  Respiratory: Negative for cough, chest tightness, wheezing and stridor.   Cardiovascular: Negative for chest pain, palpitations and leg swelling.  Gastrointestinal: Negative for nausea, vomiting, abdominal pain, diarrhea, constipation, blood in stool and abdominal distention.  Genitourinary: Negative for urgency, hematuria, flank pain, discharge, difficulty urinating and genital sores.  Musculoskeletal: Negative for myalgias, back pain, joint swelling, arthralgias and gait problem.  Skin: Negative for rash.  Neurological: Positive for weakness. Negative for dizziness,  syncope, speech difficulty, numbness and headaches.  Hematological: Negative for adenopathy. Does not bruise/bleed easily.  Psychiatric/Behavioral: Negative for behavioral problems and dysphoric mood. The patient is not nervous/anxious.        Objective:   Physical Exam  Constitutional: He is oriented to person, place, and time. He appears well-developed.  HENT:  Head: Normocephalic.  Right Ear: External ear normal.  Left Ear: External ear normal.  Eyes: Conjunctivae and EOM are normal.  Neck: Normal range of motion.  Cardiovascular: Normal rate and normal heart sounds.   Pulmonary/Chest: Breath sounds normal.       O2  saturation 98 Pulse rate 58  Abdominal: Bowel sounds are normal.  Musculoskeletal: Normal range of motion. He exhibits no edema and no tenderness.  Neurological: He is alert and oriented to person, place, and time.       Right-sided weakness with a right HH  Psychiatric: He has a normal mood and affect. His behavior is normal.          Assessment & Plan:   Glioblastoma multiforme History of hypertension. Presently normal tensive off medication Hypothyroidism. Medications refilled Secondary seizure disorder  Followup with oncology both locally and at [facility] Return here in one year or as needed

## 2011-10-25 NOTE — Patient Instructions (Signed)
It is important that you exercise regularly, at least 20 minutes 3 to 4 times per week.  If you develop chest pain or shortness of breath seek  medical attention.  Call or return to clinic prn if these symptoms worsen or fail to improve as anticipated.  

## 2011-11-01 ENCOUNTER — Encounter: Payer: Self-pay | Admitting: Internal Medicine

## 2011-11-01 ENCOUNTER — Ambulatory Visit (INDEPENDENT_AMBULATORY_CARE_PROVIDER_SITE_OTHER): Payer: Medicare Other | Admitting: Internal Medicine

## 2011-11-01 VITALS — BP 100/78 | HR 70 | Temp 98.0°F | Resp 18 | Ht 68.75 in | Wt 173.0 lb

## 2011-11-01 DIAGNOSIS — I10 Essential (primary) hypertension: Secondary | ICD-10-CM

## 2011-11-01 DIAGNOSIS — C719 Malignant neoplasm of brain, unspecified: Secondary | ICD-10-CM

## 2011-11-01 DIAGNOSIS — Z0000 Encounter for general adult medical examination without abnormal findings: Secondary | ICD-10-CM

## 2011-11-01 MED ORDER — LEVETIRACETAM 500 MG PO TABS: 500.0000 mg | ORAL_TABLET | Freq: Two times a day (BID) | ORAL | Status: DC

## 2011-11-01 NOTE — Patient Instructions (Signed)
Limit your sodium (Salt) intake  Please check your blood pressure on a regular basis.  If it is consistently greater than 150/90, please make an office appointment.    It is important that you exercise regularly, at least 20 minutes 3 to 4 times per week.  If you develop chest pain or shortness of breath seek  medical attention.   Call or return to clinic prn if these symptoms worsen or fail to improve as anticipated.  

## 2011-11-01 NOTE — Progress Notes (Signed)
Subjective:    Patient ID: [name], male    DOB: August 28, 1960, 51 y.o.   MRN: [id number]  HPI  51 year old patient who is seen today for a health maintenance examination. He is followed closely by oncology do to GBM and he is also followed at [facility] for vaccine therapy every 28 days.  Past Medical History  Diagnosis Date  . ALLERGIC RHINITIS 01/09/2009  . HYPERTENSION 01/09/2009  . SEIZURE DISORDER 01/09/2009  . Glioblastoma multiforme     stage IV  . Thyroid disease     Hypothyroidism    History   Social History  . Marital Status: Married    Spouse Name: N/A    Number of Children: N/A  . Years of Education: N/A   Occupational History  . Not on file.   Social History Main Topics  . Smoking status: Never Smoker   . Smokeless tobacco: Never Used  . Alcohol Use: No  . Drug Use: No  . Sexually Active: Not on file   Other Topics Concern  . Not on file   Social History Narrative  . No narrative on file    Past Surgical History  Procedure Date  . Craniotomy 2009    glioblastoma    No family history on file.  No Known Allergies  Current Outpatient Prescriptions on File Prior to Visit  Medication Sig Dispense Refill  . albuterol (VENTOLIN HFA) 108 (90 BASE) MCG/ACT inhaler Inhale 2 puffs into the lungs every 6 (six) hours as needed for wheezing.  1 Inhaler  0  . celecoxib (CELEBREX) 200 MG capsule Take 1 capsule (200 mg total) by mouth 2 (two) times daily between meals as needed.  60 capsule  4  . levETIRAcetam (KEPPRA) 750 MG tablet Take 1 tablet (750 mg total) by mouth every 12 (twelve) hours.  180 tablet  6  . levothyroxine (SYNTHROID, LEVOTHROID) 75 MCG tablet Take 1 tablet (75 mcg total) by mouth daily.  90 tablet  6  . sertraline (ZOLOFT) 25 MG tablet Take 1 tablet (25 mg total) by mouth daily. #30 with refill X 11  90 tablet  4  . vitamin B-12 (CYANOCOBALAMIN) 1000 MCG tablet Take 500 mcg by mouth daily.       Marland Kitchen zolpidem (AMBIEN CR) 6.25 MG CR  tablet Take 1 tablet (6.25 mg total) by mouth at bedtime as needed.  30 tablet  2  . DISCONTD: pantoprazole (PROTONIX) 40 MG tablet Take 40 mg by mouth daily.         BP 100/78  Pulse 70  Temp 98 F (36.7 C) (Oral)  Resp 18  Ht 5' 8.75" (1.746 m)  Wt 173 lb (78.472 kg)  BMI 25.73 kg/m2  SpO2 97%   Past medical history patient had a colonoscopy at age 43 which was normal Social history married 2 children son age 39 daughter age 39; nonsmoker and Family history father died of complications of MDS at age 55 mother age 56 and is in reasonably good health  1. Risk factors, based on past  M,S,F history-  cardiovascular risk factors include a history of hypertension  2.  Physical activities: Limited do to a right-sided hemiparesis  3.  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  Hearing: No deficits  5.  ADL's: Fairly independent in aspects of daily living. Able to drive a car. Right-sided weakness  6.  Fall risk: Moderate due to right-sided hemiparesis and right homonymous hemianopsia  7.  Home safety: No problems identified  8.  Height weight, and visual acuity; height and weight stable 9.  Counseling: Close oncology followup recommended  10. Lab orders based on risk factors: Not appropriate at this time  11. Referral : Colonoscopy age 8 (102 years after her original)  26. Care plan: Follow up oncology and at [facility]  13. Cognitive assessment: Alert and appropriate normal affect mildly impaired speech     Review of Systems  Constitutional: Negative for fever, chills, appetite change and fatigue.  HENT: Negative for hearing loss, ear pain, congestion, sore throat, trouble swallowing, neck stiffness, dental problem, voice change and tinnitus.   Eyes: Negative for pain, discharge and visual disturbance (right homonymous hemianopsia).  Respiratory: Negative for cough, chest tightness, wheezing and stridor.     Cardiovascular: Negative for chest pain, palpitations and leg swelling.  Gastrointestinal: Negative for nausea, vomiting, abdominal pain, diarrhea, constipation, blood in stool and abdominal distention.  Genitourinary: Negative for urgency, hematuria, flank pain, discharge, difficulty urinating and genital sores.  Musculoskeletal: Negative for myalgias, back pain, joint swelling, arthralgias and gait problem.  Skin: Negative for rash.  Neurological: Negative for dizziness, syncope, speech difficulty, weakness ( right spastic hemiparesthesias), numbness and headaches.  Hematological: Negative for adenopathy. Does not bruise/bleed easily.  Psychiatric/Behavioral: Negative for behavioral problems and dysphoric mood. The patient is not nervous/anxious.        Objective:   Physical Exam  Constitutional: He appears well-developed and well-nourished.  HENT:  Head: Normocephalic and atraumatic.  Right Ear: External ear normal.  Left Ear: External ear normal.  Nose: Nose normal.  Mouth/Throat: Oropharynx is clear and moist.  Eyes: Conjunctivae normal and EOM are normal. Pupils are equal, round, and reactive to light. No scleral icterus.       Right homonymous hemianopsia  Neck: Normal range of motion. Neck supple. No JVD present. No thyromegaly present.  Cardiovascular: Regular rhythm, normal heart sounds and intact distal pulses.  Exam reveals no gallop and no friction rub.   No murmur heard. Pulmonary/Chest: Effort normal and breath sounds normal. He exhibits no tenderness.  Abdominal: Soft. Bowel sounds are normal. He exhibits no distension and no mass. There is no tenderness.  Genitourinary: Prostate normal and penis normal. Guaiac negative stool.  Musculoskeletal: Normal range of motion. He exhibits no edema and no tenderness.  Lymphadenopathy:    He has no cervical adenopathy.  Neurological: He is alert. He displays abnormal reflex. No cranial nerve deficit. Coordination normal.        Spastic right hemiparesis  Right-sided hyperreflexia with clonus of the right ankle  Skin: Skin is warm and dry. No rash noted.  Psychiatric: He has a normal mood and affect. His behavior is normal.          Assessment & Plan:   Preventive health examination Glioblastoma multiforme-  follow up oncology History of hypertension. This has been aggravated by chemotherapy in the past blood pressure has been consistently low normal range. Suggested he take Catapres every other day for 2 weeks with home blood pressure monitoring and to discontinue blood pressure remains in a low-normal range

## 2011-11-25 ENCOUNTER — Other Ambulatory Visit (HOSPITAL_BASED_OUTPATIENT_CLINIC_OR_DEPARTMENT_OTHER): Payer: Medicare Other | Admitting: Lab

## 2011-11-25 ENCOUNTER — Ambulatory Visit (HOSPITAL_BASED_OUTPATIENT_CLINIC_OR_DEPARTMENT_OTHER): Payer: Medicare Other | Admitting: Oncology

## 2011-11-25 ENCOUNTER — Telehealth: Payer: Self-pay | Admitting: Oncology

## 2011-11-25 VITALS — BP 122/86 | HR 78 | Temp 97.9°F | Resp 20 | Ht 68.75 in | Wt 173.4 lb

## 2011-11-25 DIAGNOSIS — C719 Malignant neoplasm of brain, unspecified: Secondary | ICD-10-CM

## 2011-11-25 DIAGNOSIS — C711 Malignant neoplasm of frontal lobe: Secondary | ICD-10-CM

## 2011-11-25 LAB — CBC WITH DIFFERENTIAL/PLATELET
Basophils Absolute: 0 10*3/uL (ref 0.0–0.1)
EOS%: 2.5 % (ref 0.0–7.0)
Eosinophils Absolute: 0.1 10*3/uL (ref 0.0–0.5)
HCT: 35.7 % — ABNORMAL LOW (ref 38.4–49.9)
HGB: 12.3 g/dL — ABNORMAL LOW (ref 13.0–17.1)
MONO#: 0.4 10*3/uL (ref 0.1–0.9)
NEUT#: 1.9 10*3/uL (ref 1.5–6.5)
NEUT%: 57.4 % (ref 39.0–75.0)
RDW: 12.2 % (ref 11.0–14.6)
WBC: 3.2 10*3/uL — ABNORMAL LOW (ref 4.0–10.3)
lymph#: 0.9 10*3/uL (ref 0.9–3.3)

## 2011-11-25 NOTE — Telephone Encounter (Signed)
appts made and printed for pt aom °

## 2011-11-25 NOTE — Progress Notes (Signed)
[place]    OFFICE PROGRESS NOTE   INTERVAL HISTORY:   He returns as scheduled. He continues monthly vaccine therapy at [facility]. He had one episode of "loss of vision "last week. He describes seen lines in the visual field. The episode lasted for 5-10 minutes and spontaneously resolved. No associated neurologic symptoms. He reports several episodes of increased Word finding difficulty, but this has not been a consistent problem.  The right-sided strength is stable. He no longer has pain in the right shoulder. He is taking no pain medication.  [name] underwent a restaging MRI at [facility] on 11/06/2011. The resection site appears stable. There was a less than 1 cm new questionable lesion medial to the resection site that did not enhance. He has been scheduled for a one-month followup MRI.  Objective:  Vital signs in last 24 hours:  Blood pressure 122/86, pulse 78, temperature 97.9 F (36.6 C), temperature source Oral, resp. rate 20, height 5' 8.75" (1.746 m), weight 173 lb 6.4 oz (78.654 kg).    HEENT: No thrush Resp: Lungs clear bilaterally Cardio: Regular rate and Vascular: No leg edema Neuro: Alert and oriented, the speech is fluent, 4/5 strength at the right leg and foot. 3/5 strength at the right hand. Right visual field deficit.     Lab Results:  Lab Results  Component Value Date   WBC 3.2* 11/25/2011   HGB 12.3* 11/25/2011   HCT 35.7* 11/25/2011   MCV 97.3 11/25/2011   PLT 117* 11/25/2011   ANC 1.9   Medications: I have reviewed the patient's current medications.  Assessment/Plan: 1. Glioblastoma multiforme. An MRI of the brain during a hospital admission on 03/27/2010 was compared to an MRI from [place] on 02/22/2010. There was an increase in the abnormal signal of the left posterior opercular region and periatrial region, concerning for progression of tumor. Review of the MRI by Dr. Ferdinand Thomas at [facility] confirmed disease progression. Avastin was resumed on  05/28/2010. Hydroxyurea was initiated on 06/07/2010. The hydroxyurea was placed on hold on 06/27/2010 due to leukopenia and thrombocytopenia.  a. Initiation of salvage therapy with temozolomide and the Celldex vaccine beginning on 08/16/2010.  b. MRI of the brain at [facility] on 10/31/2010 is concerning for disease progression with an interval increase in enhancement surrounding the resection cavity and inferior margin of the putamen and left optic tract. c. MRI of the brain at [facility] on 11/28/2010 showed a slight interval increase in peripheral nodular enhancement surrounding the resection cavity d. He underwent a repeat MRI of the brain at [facility] on 12/26/2010-this showed a stable appearance of the left temporal lobe resection cavity and marginal enhancement. e. Restaging MRI of the brain 12/26/2010 at [facility] with a slight interval decrease in the size of residual left temporal lobe tumor f. Restaging MRI of the brain at [facility] on 02/20/2011 revealed a slight interval decrease in the residual left temporal lobe tumor g. Restaging MRI of the brain at [facility] on 04/03/2011 revealed stable disease. h. MRI 06/12/2011 at [facility] with stable left temporal resection cavity with decreased peripheral enhancement i. He completed a final cycle of temozolomide beginning on 06/27/11 and continues monthly vaccine therapy j. PET scan on may 29th 2013 at [facility] definite foci of hypermetabolic FDG activity identified, in the region of the irregular nodular MRI enhancement in the inferior left temporal lobe there are no definite foci of hypermetabolic activity, there was low level FDG activity in this location similar to the surrounding nonenhancing left  temporal lobe k. Restaging MRI of the brain at [facility] on 11/06/2011-stable left brain resection site and? New less than 1 cm left brain lesion 2. 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 3. Right visual  field deficit secondary to progression of the glioblastoma multiforme, stable 4. 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 5. Hypothyroidism, maintained on Synthroid. 6. Hypertension, essential hypertension and an effect of Avastin. Improved 7. History of nephrotic syndrome secondary to Avastin. 8. History of progressive thrombocytopenia, status post a bone marrow biopsy on 04/26/2010 with findings of a hypocellular marrow with decreased megakaryocytes and a left shift in the myeloid and erythroid series and minimal dyspoiesis. Cytogenetics returned normal. He again developed thrombocytopenia after receiving Avastin and beginning hydroxyurea in April 2012. The platelet count has improved since the Avastin and hydroxyurea were discontinued.  9. Vitamin B12 deficiency. 10. Acute diverticulitis in April 2012, improved with ciprofloxacin and Flagyl. Related to Avastin (?).  11. Adhesive capsulitis of the right shoulder, followed by Dr. Amedeo Thomas. He is no longer taking pain medication and the range of motion has improved after a physical therapy program.  12. 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 13. Low serum testosterone level on labs requested by Dr. Ferdinand Thomas in November 2012-he is not taking testosterone     Disposition:  His overall status is unchanged. He will continue the Celldex vaccine program at [facility] Mr. Even is scheduled for an MRI at [facility] later this month. He will return for an office visit here on 01/09/2012. He will contact us in the interim for new symptoms.  I am encouraged he is no longer taking pain medication and the range of motion at the right shoulder has improved.   Betsy Coder, MD  11/25/2011  3:46 PM

## 2011-12-19 ENCOUNTER — Other Ambulatory Visit: Payer: Self-pay | Admitting: *Deleted

## 2011-12-19 DIAGNOSIS — C712 Malignant neoplasm of temporal lobe: Secondary | ICD-10-CM

## 2011-12-19 MED ORDER — CLONIDINE HCL 0.1 MG PO TABS: 0.1000 mg | ORAL_TABLET | Freq: Two times a day (BID) | ORAL | Status: DC

## 2012-01-08 ENCOUNTER — Telehealth: Payer: Self-pay | Admitting: *Deleted

## 2012-01-08 NOTE — Telephone Encounter (Signed)
Received call from pt stating "I have a fever 99.4, started last night, head congestion/cough, taking Advil, feels like the flu; should I come tomorrow for my appt at 12?"  Per Dr. Truett Perna OK to re-schedule, instructed to drink plenty of fluids and call if any problems.  Pt verbalized understanding of instructions.

## 2012-01-09 ENCOUNTER — Telehealth: Payer: Self-pay | Admitting: *Deleted

## 2012-01-09 ENCOUNTER — Ambulatory Visit: Payer: Medicare Other | Admitting: Oncology

## 2012-01-09 ENCOUNTER — Other Ambulatory Visit: Payer: Self-pay | Admitting: *Deleted

## 2012-01-09 ENCOUNTER — Telehealth: Payer: Self-pay | Admitting: Internal Medicine

## 2012-01-09 NOTE — Telephone Encounter (Signed)
Fever today 101-100 with aches/chills. Thinks he has flu. Did not get flu vaccine this year. Instructed him to call his PCP, Dr. Amador Cunas. He may call in Tamiflu. Will reschedule his missed office visit.

## 2012-01-09 NOTE — Telephone Encounter (Signed)
Patient Information:  Caller Name: [facility]  Phone: [phone]  Patient: [name]  Gender: Male  DOB: 10-20-1960  Age: 51 Years  PCP: Eleonore Chiquito [facility])   Symptoms  Reason For Call & Symptoms: flu-like symptoms  Reviewed Health History In EMR: Yes  Reviewed Medications In EMR: Yes  Reviewed Allergies In EMR: Yes  Date of Onset of Symptoms: 01/08/2012  Treatments Tried: Ibuprofen 600 mg q 4 hours  Treatments Tried Worked: Yes  Any Fever: Yes  Fever Taken: Oral  Fever Time Of Reading: 14:30:00  Fever Last Reading: 99.6  Guideline(s) Used:  Fever  Disposition Per Guideline:   Home Care  Reason For Disposition Reached:   Fever without localizing symptoms  Advice Given:  Call Back If:  Fever lasts longer than 3 days (72 hours)  You become worse  Fever Medicines:  For fevers above 101 F (38.3 C) take either acetaminophen or ibuprofen.  The goal of fever therapy is to bring the fever down to a comfortable level. Remember that fever medicine usually lowers fever 2 degrees F (1 - 1 1/2 degrees C).  For All Fevers:  Drink cold fluids orally to prevent dehydration (Reason: good hydration replaces sweat and improves heat loss via skin). Adults should drink 6-8 glasses of water daily.  For fevers 100-101 F (37.8-38.3 C) this is the only treatment and fever medicine is unnecessary.  Office Follow Up:  Does the office need to follow up with this patient?: Yes  Instructions For The Office: please follow up with patient regarding Tamiflu request  Patient Refused Recommendation:  Patient Requests Prescription  pt has history of cancer is wants to know if Tamiflu can be ordered  RN Note:  pt reports flu symptoms (body aches, sore throat, temp 101.5 )

## 2012-01-09 NOTE — Telephone Encounter (Signed)
Needs to be seen.  I feel that we need to evaluate to make sure this is viral vs bacterial.  This could be flu but not aware this is going around yet in community.

## 2012-01-10 NOTE — Telephone Encounter (Signed)
Spoke to pt told him needs to be seen per Dr. Caryl Never. Pt stated his fever has broke and not sure if wants to come in. Told pt that is up to him but can not call in any medication until seen. Pt verbalized understanding and will call back if he decides to be seen to day.

## 2012-01-14 ENCOUNTER — Telehealth: Payer: Self-pay | Admitting: Oncology

## 2012-01-14 NOTE — Telephone Encounter (Signed)
S/w pt re appt for 12/9.

## 2012-01-27 ENCOUNTER — Telehealth: Payer: Self-pay | Admitting: Oncology

## 2012-01-27 ENCOUNTER — Ambulatory Visit (HOSPITAL_BASED_OUTPATIENT_CLINIC_OR_DEPARTMENT_OTHER): Payer: Medicare Other | Admitting: Oncology

## 2012-01-27 VITALS — BP 111/85 | HR 80 | Temp 97.0°F | Resp 18 | Ht 68.75 in | Wt 178.7 lb

## 2012-01-27 DIAGNOSIS — E538 Deficiency of other specified B group vitamins: Secondary | ICD-10-CM

## 2012-01-27 DIAGNOSIS — Z23 Encounter for immunization: Secondary | ICD-10-CM

## 2012-01-27 DIAGNOSIS — C719 Malignant neoplasm of brain, unspecified: Secondary | ICD-10-CM

## 2012-01-27 DIAGNOSIS — C711 Malignant neoplasm of frontal lobe: Secondary | ICD-10-CM

## 2012-01-27 MED ORDER — INFLUENZA VIRUS VACC SPLIT PF IM SUSP
0.5000 mL | Freq: Once | INTRAMUSCULAR | Status: AC
  Administered 2012-01-27: 0.5 mL via INTRAMUSCULAR
  Filled 2012-01-27: qty 0.5

## 2012-01-27 NOTE — Telephone Encounter (Signed)
appts made and printed for pt aom °

## 2012-01-27 NOTE — Progress Notes (Signed)
Next visit at [facility] w/vaccine 01/29/12. No recent "loss of vision" episodes. Reports having the flu in late November with fever for several days. He did not go to his PCP as he was instructed to do when he called this office.

## 2012-01-27 NOTE — Progress Notes (Signed)
[name]    OFFICE PROGRESS NOTE   INTERVAL HISTORY:   He returns as scheduled. No new neurologic symptoms. He had an upper respiratory infection in 3 weeks ago with a cough. His symptoms have resolved. He continues monthly vaccine treatment at [facility].  A restaging MRI of the brain on 12/31/2011 was stable.  Objective:  Vital signs in last 24 hours:  Blood pressure 111/85, pulse 80, temperature 97 F (36.1 C), temperature source Oral, resp. rate 18, height 5' 8.75" (1.746 m), weight 178 lb 11.2 oz (81.058 kg).   Resp: Lungs clear bilaterally Cardio: Regular rate and rhythm GI: No hepatomegaly Vascular: No leg edema Neuro: Right arm/hand weakness, mild weakness at the right leg. Decreased strength with internal/external rotation at the right foot     Medications: I have reviewed the patient's current medications.  Assessment/Plan: 1. Glioblastoma multiforme. An MRI of the brain during a hospital admission on 03/27/2010 was compared to an MRI from [facility] on 02/22/2010. There was an increase in the abnormal signal of the left posterior opercular region and periatrial region, concerning for progression of tumor. Review of the MRI by Dr. Ferdinand Lango at [facility] confirmed disease progression. Avastin was resumed on 05/28/2010. Hydroxyurea was initiated on 06/07/2010. The hydroxyurea was placed on hold on 06/27/2010 due to leukopenia and thrombocytopenia.  a. Initiation of salvage therapy with temozolomide and the Celldex vaccine beginning on 08/16/2010.  b. MRI of the brain at [facility] on 10/31/2010 is concerning for disease progression with an interval increase in enhancement surrounding the resection cavity and inferior margin of the putamen and left optic tract. c. MRI of the brain at [facility] on 11/28/2010 showed a slight interval increase in peripheral nodular enhancement surrounding the resection cavity d. He underwent a repeat MRI of the brain at [facility] on 12/26/2010-this showed a  stable appearance of the left temporal lobe resection cavity and marginal enhancement. e. Restaging MRI of the brain 12/26/2010 at [facility] with a slight interval decrease in the size of residual left temporal lobe tumor f. Restaging MRI of the brain at [facility] on 02/20/2011 revealed a slight interval decrease in the residual left temporal lobe tumor g. Restaging MRI of the brain at [facility] on 04/03/2011 revealed stable disease. h. MRI 06/12/2011 at [facility] with stable left temporal resection cavity with decreased peripheral enhancement i. He completed a final cycle of temozolomide beginning on 06/27/11 and continues monthly vaccine therapy j. PET scan on may 29th 2013 at [facility] definite foci of hypermetabolic FDG activity identified, in the region of the irregular nodular MRI enhancement in the inferior left temporal lobe there are no definite foci of hypermetabolic activity, there was low level FDG activity in this location similar to the surrounding nonenhancing left temporal lobe k. Restaging MRI of the brain at [facility] on 11/06/2011-stable left brain resection site and? New less than 1 cm left brain lesion, no enhancement on a PET scan l. Stable radiographic disease on an MRI 12/31/2011 2. 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 3. 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 5. Hypothyroidism, maintained on Synthroid. 6. Hypertension, essential hypertension and an effect of Avastin. Improved 7. History of nephrotic syndrome secondary to Avastin. 8. History of progressive thrombocytopenia, status post a bone marrow biopsy on 04/26/2010 with findings of a hypocellular marrow with decreased megakaryocytes and a left shift in the  myeloid and erythroid series and minimal dyspoiesis. Cytogenetics returned normal. He again  developed thrombocytopenia after receiving Avastin and beginning hydroxyurea in April 2012. The platelet count has improved since the Avastin and hydroxyurea were discontinued.  9. Vitamin B12 deficiency. 10. Acute diverticulitis in April 2012, improved with ciprofloxacin and Flagyl. Related to Avastin (?).  11. Adhesive capsulitis of the right shoulder, followed by Dr. Amedeo Plenty. He is no longer taking pain medication and the range of motion has improved after a physical therapy program.  12. 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 13. Low serum testosterone level on labs requested by Dr. Ferdinand Lango in November 2012-he is not taking testosterone    Disposition:  He appears stable. No clinical evidence for progression of the glioblastoma. He received an influenza vaccine today. Mr. Widrig will return for an office visit in 3 months. He continues the Celldex vaccine treatment each month. He is scheduled for a followup upon and brain MRI at [facility] next month.   Betsy Coder, MD  01/27/2012  11:57 AM

## 2012-02-05 ENCOUNTER — Ambulatory Visit (INDEPENDENT_AMBULATORY_CARE_PROVIDER_SITE_OTHER): Payer: Medicare Other | Admitting: Internal Medicine

## 2012-02-05 ENCOUNTER — Encounter: Payer: Self-pay | Admitting: Internal Medicine

## 2012-02-05 VITALS — BP 122/80 | HR 68 | Temp 97.8°F | Resp 18 | Wt 183.0 lb

## 2012-02-05 DIAGNOSIS — J309 Allergic rhinitis, unspecified: Secondary | ICD-10-CM

## 2012-02-05 DIAGNOSIS — J40 Bronchitis, not specified as acute or chronic: Secondary | ICD-10-CM

## 2012-02-05 DIAGNOSIS — I10 Essential (primary) hypertension: Secondary | ICD-10-CM

## 2012-02-05 MED ORDER — AZITHROMYCIN 250 MG PO TABS: ORAL_TABLET | ORAL | Status: DC

## 2012-02-05 MED ORDER — HYDROCODONE-HOMATROPINE 5-1.5 MG/5ML PO SYRP: 5.0000 mL | ORAL_SOLUTION | Freq: Four times a day (QID) | ORAL | Status: AC | PRN

## 2012-02-05 MED ORDER — SULFACETAMIDE SODIUM 10 % OP SOLN: 1.0000 [drp] | OPHTHALMIC | Status: DC

## 2012-02-05 NOTE — Progress Notes (Signed)
Subjective:    Patient ID: [name], male    DOB: [birth year], 51 y.o.   MRN: [id number]  HPI  51 year old patient who has a history of hypertension hypothyroidism and is followed closely for GBM who presents with a six-week history of head and chest congestion. He has refractory cough which has been productive of green sputum. He feels unwell and has had a difficult time sleeping. He had fever initially but has been afebrile more recently.  Past Medical History  Diagnosis Date  . ALLERGIC RHINITIS 01/09/2009  . HYPERTENSION 01/09/2009  . SEIZURE DISORDER 01/09/2009  . Glioblastoma multiforme     stage IV  . Thyroid disease     Hypothyroidism    History   Social History  . Marital Status: Married    Spouse Name: N/A    Number of Children: N/A  . Years of Education: N/A   Occupational History  . Not on file.   Social History Main Topics  . Smoking status: Never Smoker   . Smokeless tobacco: Never Used  . Alcohol Use: No  . Drug Use: No  . Sexually Active: Not on file   Other Topics Concern  . Not on file   Social History Narrative  . No narrative on file    Past Surgical History  Procedure Date  . Craniotomy 2009    glioblastoma    No family history on file.  No Known Allergies  Current Outpatient Prescriptions on File Prior to Visit  Medication Sig Dispense Refill  . albuterol (VENTOLIN HFA) 108 (90 BASE) MCG/ACT inhaler Inhale 2 puffs into the lungs every 6 (six) hours as needed for wheezing.  1 Inhaler  0  . celecoxib (CELEBREX) 200 MG capsule Take 1 capsule (200 mg total) by mouth 2 (two) times daily between meals as needed.  60 capsule  4  . cloNIDine (CATAPRES) 0.1 MG tablet Take 1 tablet (0.1 mg total) by mouth 2 (two) times daily.  60 tablet  5  . levETIRAcetam (KEPPRA) 500 MG tablet Take 1 tablet (500 mg total) by mouth 2 (two) times daily.  180 tablet  6  . levothyroxine (SYNTHROID, LEVOTHROID) 75 MCG tablet Take 1 tablet (75 mcg total) by mouth  daily.  90 tablet  6  . sertraline (ZOLOFT) 25 MG tablet Take 1 tablet (25 mg total) by mouth daily. #30 with refill X 11  90 tablet  4  . STUDY MEDICATION Inject as directed every 30 (thirty) days. Study drug injection monthly at [facility]      . vitamin B-12 (CYANOCOBALAMIN) 1000 MCG tablet Take 500 mcg by mouth daily.       Marland Kitchen zolpidem (AMBIEN CR) 6.25 MG CR tablet Take 1 tablet (6.25 mg total) by mouth at bedtime as needed.  30 tablet  2  . [DISCONTINUED] pantoprazole (PROTONIX) 40 MG tablet Take 40 mg by mouth daily.         BP 122/80  Pulse 68  Temp 97.8 F (36.6 C) (Oral)  Resp 18  Wt 183 lb (83.008 kg)  SpO2 96%      Review of Systems  Constitutional: Positive for activity change, appetite change and fatigue. Negative for fever and chills.  HENT: Positive for congestion and postnasal drip. Negative for hearing loss, ear pain, sore throat, trouble swallowing, neck stiffness, dental problem, voice change and tinnitus.   Eyes: Negative for pain, discharge and visual disturbance.  Respiratory: Positive for cough. Negative for chest tightness, wheezing and stridor.  Cardiovascular: Negative for chest pain, palpitations and leg swelling.  Gastrointestinal: Negative for nausea, vomiting, abdominal pain, diarrhea, constipation, blood in stool and abdominal distention.  Genitourinary: Negative for urgency, hematuria, flank pain, discharge, difficulty urinating and genital sores.  Musculoskeletal: Negative for myalgias, back pain, joint swelling, arthralgias and gait problem.  Skin: Negative for rash.  Neurological: Negative for dizziness, syncope, speech difficulty, weakness, numbness and headaches.  Hematological: Negative for adenopathy. Does not bruise/bleed easily.  Psychiatric/Behavioral: Negative for behavioral problems and dysphoric mood. The patient is not nervous/anxious.        Objective:   Physical Exam  Constitutional: He is oriented to person, place, and time. He appears  well-developed.  HENT:  Head: Normocephalic.  Right Ear: External ear normal.  Left Ear: External ear normal.       Conjunctival injection bilaterally  Eyes: Conjunctivae normal and EOM are normal.  Neck: Normal range of motion.  Cardiovascular: Normal rate and normal heart sounds.   Pulmonary/Chest: Breath sounds normal.  Abdominal: Bowel sounds are normal.  Musculoskeletal: Normal range of motion. He exhibits no edema and no tenderness.  Neurological: He is alert and oriented to person, place, and time.       Right arm hemiparesis  Psychiatric: He has a normal mood and affect. His behavior is normal.          Assessment & Plan:   Bronchitis Hypertension fair control History of allergic rhinitis  We'll treat symptomatically and also with azithromycin

## 2012-02-05 NOTE — Patient Instructions (Signed)
Use saline irrigation, warm  moist compresses and over-the-counter decongestants only as directed.  Call if there is no improvement in 5 to 7 days, or sooner if you develop increasing pain, fever, or any new symptoms. 

## 2012-03-27 ENCOUNTER — Telehealth: Payer: Self-pay | Admitting: *Deleted

## 2012-03-27 MED ORDER — MELOXICAM 7.5 MG PO TABS: 7.5000 mg | ORAL_TABLET | Freq: Every day | ORAL | Status: DC

## 2012-03-27 NOTE — Telephone Encounter (Signed)
Message from pt reporting his cost for Xeloda has gone up. Asking if Dr. Truett Perna will prescribe Meloxicam. Reviewed with Dr. Truett Perna. Order received, sent to pharmacy.

## 2012-05-05 ENCOUNTER — Ambulatory Visit: Payer: Medicare Other | Admitting: Oncology

## 2012-05-08 ENCOUNTER — Telehealth: Payer: Self-pay | Admitting: Oncology

## 2012-05-08 ENCOUNTER — Other Ambulatory Visit: Payer: Self-pay | Admitting: *Deleted

## 2012-05-08 NOTE — Progress Notes (Signed)
[facility] for f/u 05/05/12. POF to reschedule in next month

## 2012-05-08 NOTE — Telephone Encounter (Signed)
Talked to pt and gave him appt time for April 2014

## 2012-05-13 ENCOUNTER — Telehealth: Payer: Self-pay | Admitting: Internal Medicine

## 2012-05-13 NOTE — Telephone Encounter (Signed)
Appt scheduled. FYI

## 2012-05-13 NOTE — Telephone Encounter (Signed)
Patient Information:  Caller Name: Muriel  Phone: [phone]  Patient: [name], [name]  Gender: Male  DOB: 18-Jul-1960  Age: 52 Years  PCP: Eleonore Chiquito [facility])  Office Follow Up:  Does the office need to follow up with this patient?: No  Instructions For The Office: N/A  RN Note:  Reports immunocompromised due to cancer.  Asking for medication to treat cold/cough symptoms. Advised to see MD within 24 hours per nursing judgement for caller requesting medication for URI symptoms. Hydrate and humidify.  May use nasal saline. Call if fever or worsens.    Symptoms  Reason For Call & Symptoms: Called to request mediation for upper respiratory infection with "green crud" (green nasal mucus and phlem) and occasional cough.  Reviewed Health History In EMR: Yes  Reviewed Medications In EMR: Yes  Reviewed Allergies In EMR: Yes  Reviewed Surgeries / Procedures: Yes  Date of Onset of Symptoms: 05/11/2012  Guideline(s) Used:  Colds  Disposition Per Guideline:   Home Care  Reason For Disposition Reached:   Colds with no complications  Advice Given:  N/A  RN Overrode Recommendation:  Make Appointment  Requesting antiboitic; advised to see MD within 24 hours per MD orders for antibiotics.  Appointment Scheduled:  05/14/2012 10:15:00 Appointment Scheduled Provider:  Eleonore Chiquito [facility])

## 2012-05-14 ENCOUNTER — Encounter: Payer: Self-pay | Admitting: Internal Medicine

## 2012-05-14 ENCOUNTER — Ambulatory Visit (INDEPENDENT_AMBULATORY_CARE_PROVIDER_SITE_OTHER): Payer: Medicare Other | Admitting: Internal Medicine

## 2012-05-14 VITALS — BP 136/90 | HR 96 | Temp 98.6°F | Resp 20 | Wt 188.0 lb

## 2012-05-14 DIAGNOSIS — R569 Unspecified convulsions: Secondary | ICD-10-CM

## 2012-05-14 DIAGNOSIS — J309 Allergic rhinitis, unspecified: Secondary | ICD-10-CM

## 2012-05-14 DIAGNOSIS — C719 Malignant neoplasm of brain, unspecified: Secondary | ICD-10-CM

## 2012-05-14 MED ORDER — AZITHROMYCIN 250 MG PO TABS: ORAL_TABLET | ORAL | Status: DC

## 2012-05-14 NOTE — Progress Notes (Signed)
Subjective:    Patient ID: [name], male    DOB: 24-Feb-1960, 52 y.o.   MRN: [id number]  HPI   52 year old patient who has a history of glioblastoma multiforme who presently does not require chemotherapy. He also has a history of allergic rhinitis. For the past few days she has had some sinus and chest congestion. He has had minimal yellow sputum production. No fever. His mother had a similar illness prior to his onset.  Past Medical History  Diagnosis Date  . ALLERGIC RHINITIS 01/09/2009  . HYPERTENSION 01/09/2009  . SEIZURE DISORDER 01/09/2009  . Glioblastoma multiforme     stage IV  . Thyroid disease     Hypothyroidism    History   Social History  . Marital Status: Married    Spouse Name: N/A    Number of Children: N/A  . Years of Education: N/A   Occupational History  . Not on file.   Social History Main Topics  . Smoking status: Never Smoker   . Smokeless tobacco: Never Used  . Alcohol Use: No  . Drug Use: No  . Sexually Active: Not on file   Other Topics Concern  . Not on file   Social History Narrative  . No narrative on file    Past Surgical History  Procedure Laterality Date  . Craniotomy  2009    glioblastoma    History reviewed. No pertinent family history.  No Known Allergies  Current Outpatient Prescriptions on File Prior to Visit  Medication Sig Dispense Refill  . levETIRAcetam (KEPPRA) 500 MG tablet Take 1 tablet (500 mg total) by mouth 2 (two) times daily.  180 tablet  6  . levothyroxine (SYNTHROID, LEVOTHROID) 75 MCG tablet Take 1 tablet (75 mcg total) by mouth daily.  90 tablet  6  . sertraline (ZOLOFT) 25 MG tablet Take 1 tablet (25 mg total) by mouth daily. #30 with refill X 11  90 tablet  4  . vitamin B-12 (CYANOCOBALAMIN) 1000 MCG tablet Take 500 mcg by mouth daily.       . [DISCONTINUED] pantoprazole (PROTONIX) 40 MG tablet Take 40 mg by mouth daily.        No current facility-administered medications on file prior to visit.     BP 136/90  Pulse 96  Temp(Src) 98.6 F (37 C) (Oral)  Resp 20  Wt 188 lb (85.276 kg)  BMI 27.97 kg/m2  SpO2 97%       Review of Systems  Constitutional: Positive for fatigue. Negative for fever, chills and appetite change.  HENT: Positive for congestion, postnasal drip and sinus pressure. Negative for hearing loss, ear pain, sore throat, trouble swallowing, neck stiffness, dental problem, voice change and tinnitus.   Eyes: Negative for pain, discharge and visual disturbance.  Respiratory: Positive for cough. Negative for chest tightness, wheezing and stridor.   Cardiovascular: Negative for chest pain, palpitations and leg swelling.  Gastrointestinal: Negative for nausea, vomiting, abdominal pain, diarrhea, constipation, blood in stool and abdominal distention.  Genitourinary: Negative for urgency, hematuria, flank pain, discharge, difficulty urinating and genital sores.  Musculoskeletal: Negative for myalgias, back pain, joint swelling, arthralgias and gait problem.  Skin: Negative for rash.  Neurological: Negative for dizziness, syncope, speech difficulty, weakness, numbness and headaches.  Hematological: Negative for adenopathy. Does not bruise/bleed easily.  Psychiatric/Behavioral: Negative for behavioral problems and dysphoric mood. The patient is not nervous/anxious.        Objective:   Physical Exam  Constitutional: He is oriented to  person, place, and time. He appears well-developed.  HENT:  Head: Normocephalic.  Right Ear: External ear normal.  Left Ear: External ear normal.  Eyes: Conjunctivae and EOM are normal.  Neck: Normal range of motion.  Cardiovascular: Normal rate and normal heart sounds.   Pulmonary/Chest: Breath sounds normal.  Abdominal: Bowel sounds are normal.  Musculoskeletal: Normal range of motion. He exhibits no edema and no tenderness.  Neurological: He is alert and oriented to person, place, and time.  Psychiatric: He has a normal mood and  affect. His behavior is normal.          Assessment & Plan:   Viral URI. We'll treat symptomatically History of glioblastoma multiform he History of chemotherapy associated hypertension. Repeat blood pressure today 110/76. Continue to observe off medications Hypothyroidism

## 2012-05-14 NOTE — Patient Instructions (Signed)
Acute bronchitis symptoms for less than 10 days are generally not helped by antibiotics.  Take over-the-counter expectorants and cough medications such as  Mucinex DM.  Call if there is no improvement in 5 to 7 days or if he developed worsening cough, fever, or new symptoms, such as shortness of breath or chest pain.    

## 2012-06-11 ENCOUNTER — Telehealth: Payer: Self-pay | Admitting: Oncology

## 2012-06-16 ENCOUNTER — Ambulatory Visit: Payer: Medicare Other | Admitting: Oncology

## 2012-07-14 ENCOUNTER — Ambulatory Visit: Payer: Medicare Other | Admitting: Nurse Practitioner

## 2012-08-11 IMAGING — CT CT ABD-PELV W/O CM
1 of 2 series · 13 of 32 positions shown, 19 images · non-contrast
Comparison: CT 07/08/1998 by

CLINICAL DATA: Concern for perforated bowel.  Tissue included
blastoma.

CT ABDOMEN AND PELVIS WITHOUT CONTRAST
TECHNIQUE: Multidetector CT imaging of the abdomen and pelvis was
performed following the standard protocol without intravenous
contrast.

[Series 2: rtn ap without · axial · non-contrast · 0.72mm/px · z∈[+839,+1234]mm · 13 of 93 slices shown, 19 images]
[im 7/93  soft-tissue]
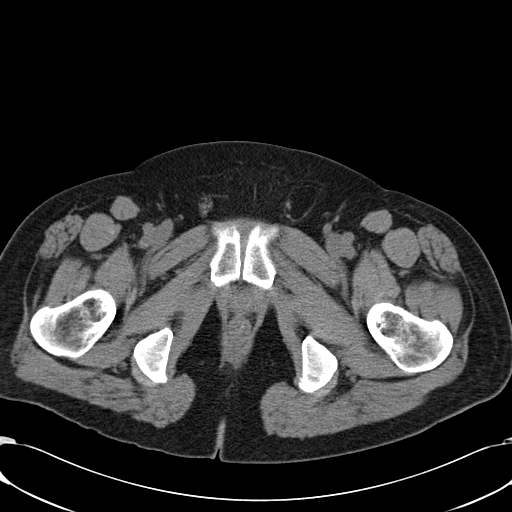
[im 7/93  bone]
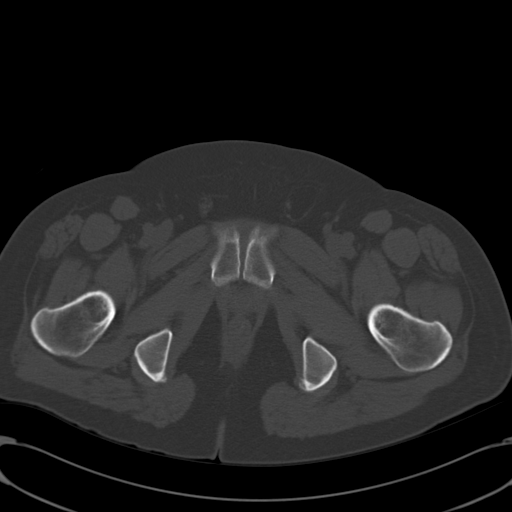
[im 13/93  soft-tissue]
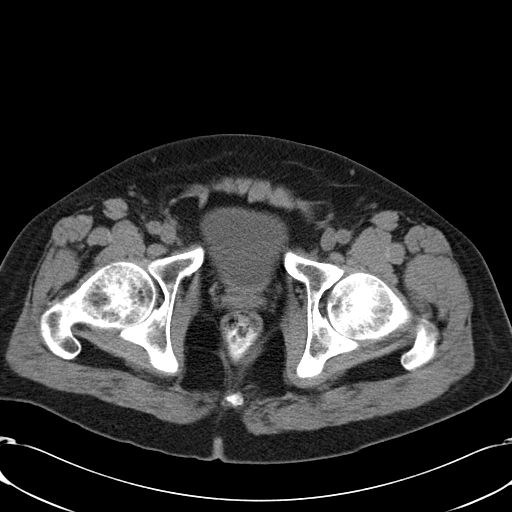
[im 19/93  soft-tissue]
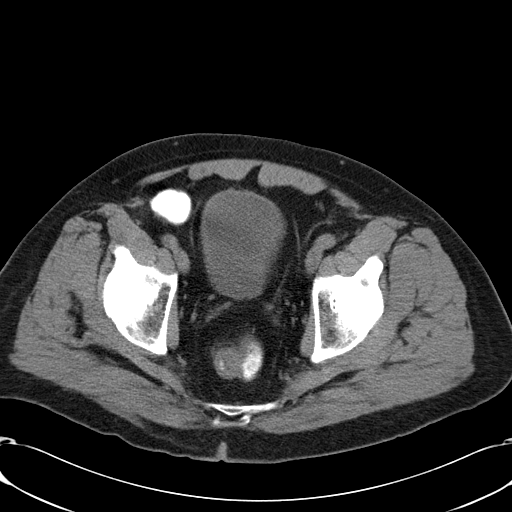
[im 25/93  soft-tissue]
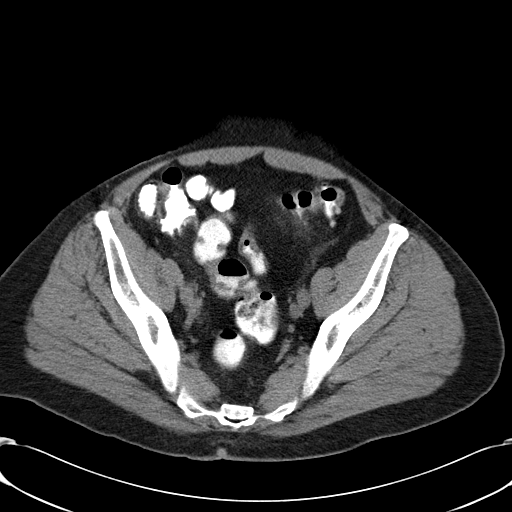
[im 31/93  soft-tissue]
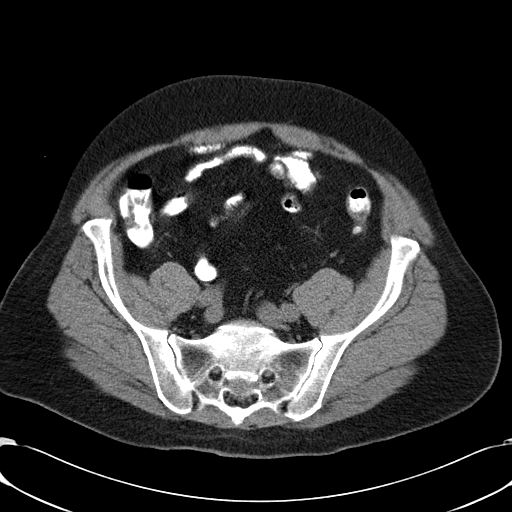
[im 37/93  soft-tissue]
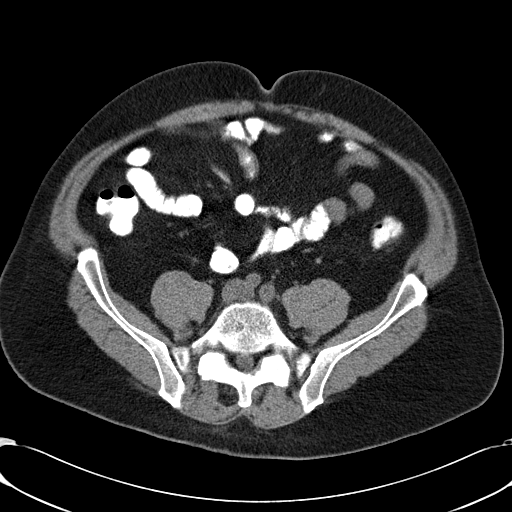
[im 50/93  soft-tissue]
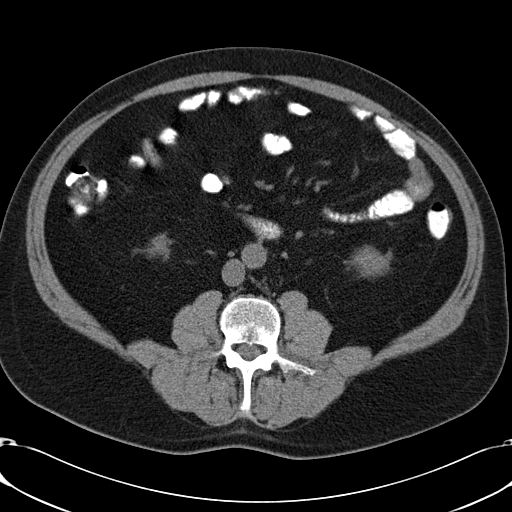
[im 56/93  soft-tissue]
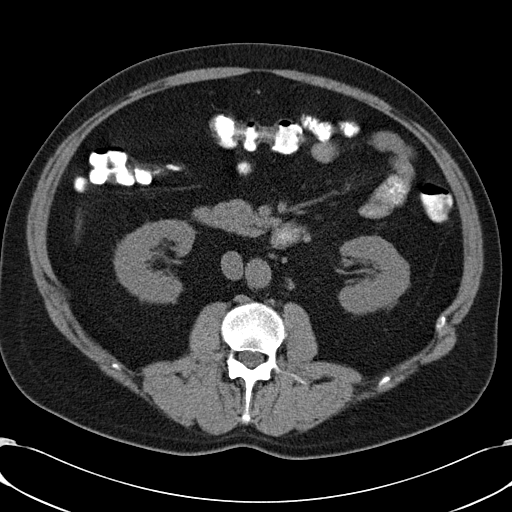
[im 62/93  soft-tissue]
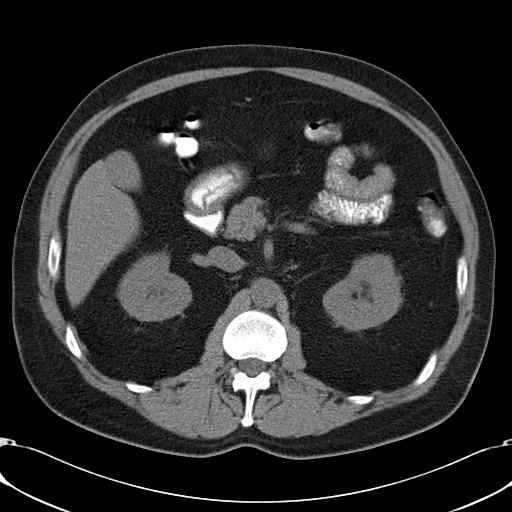
[im 62/93  bone]
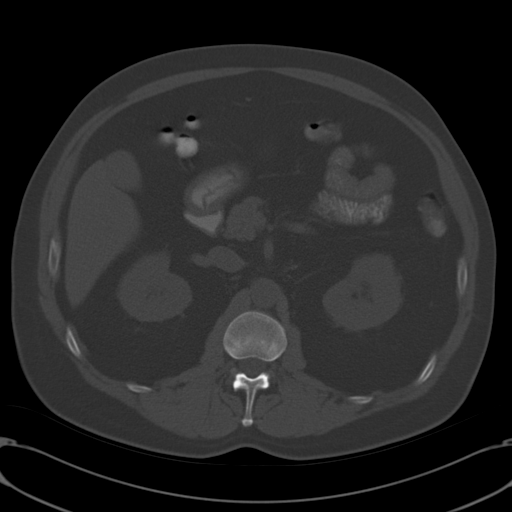
[im 68/93  soft-tissue]
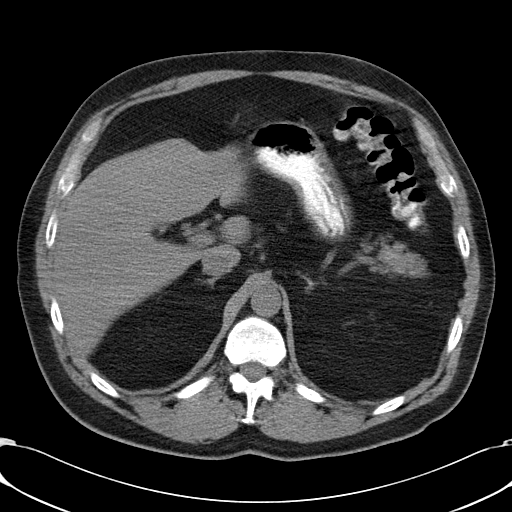
[im 68/93  lung]
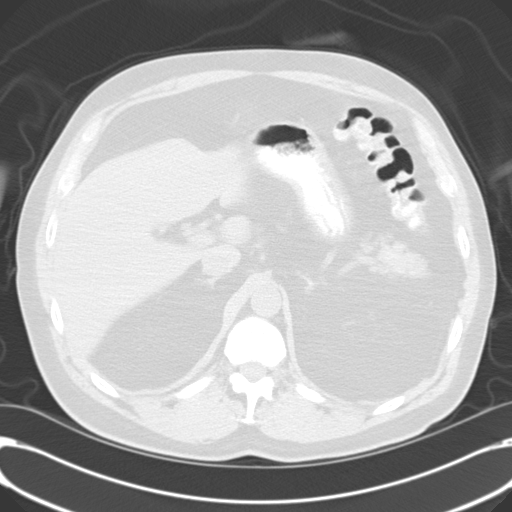
[im 74/93  soft-tissue]
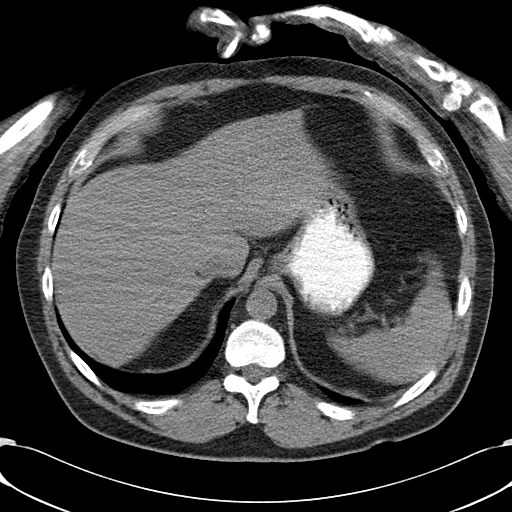
[im 74/93  lung]
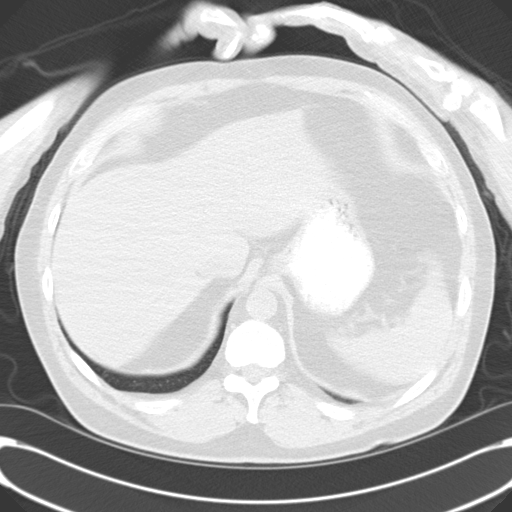
[im 80/93  soft-tissue]
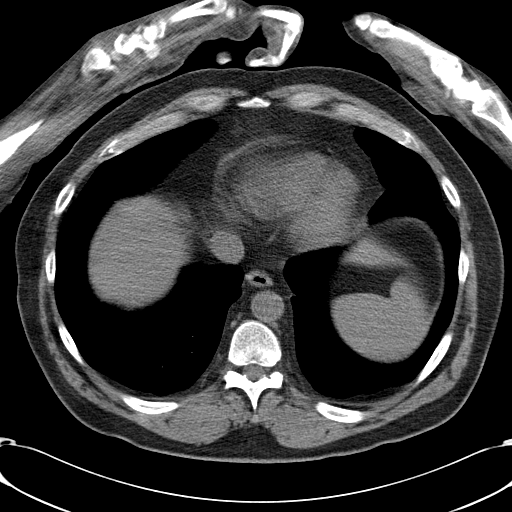
[im 80/93  lung]
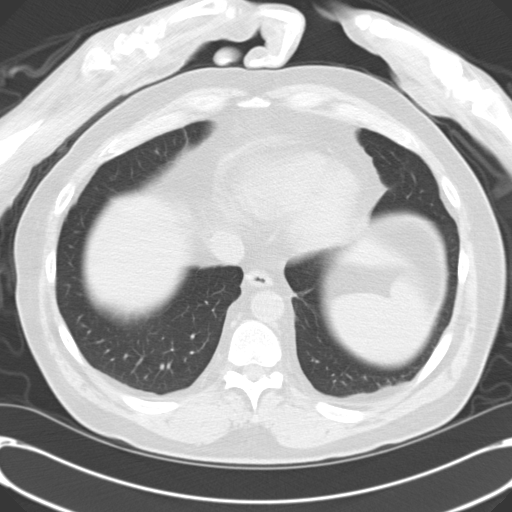
[im 86/93  soft-tissue]
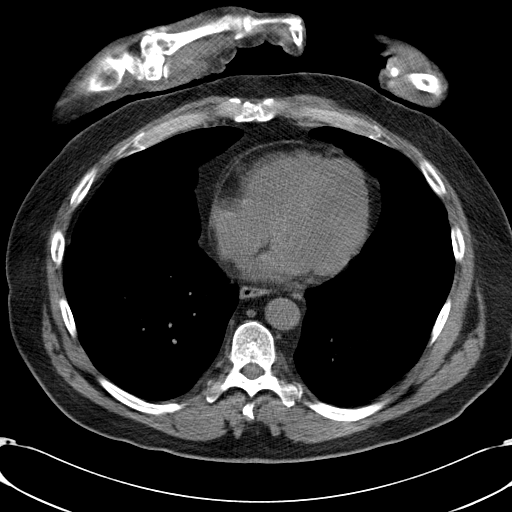
[im 86/93  lung]
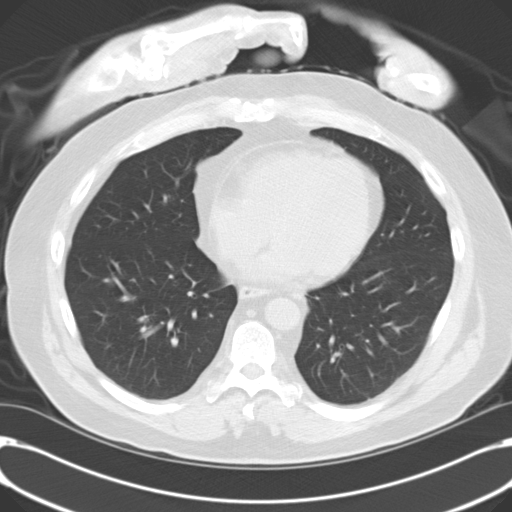

[13 of 32 positions shown; findings below may reference images not displayed]

FINDINGS: Lung bases are clear.  No pericardial fluid.

No focal hepatic lesion evident on this noncontrast exam.
Gallbladder, pancreas, spleen, adrenal glands, and kidneys are
normal.

The stomach, small bowel, and cecum are normal.  Appendix is not
identified.  There is mild stranding along the descending colon and
proximal sigmoid colon (image 70). There is several diverticula of
the colon this region.  No evidence of perforation or abscess.  The
more distal sigmoid colon rectum appear normal.

Abdominal aorta is normal caliber.  No retroperitoneal or
periportal lymphadenopathy.

No free fluid the pelvis.  The bladder and prostate gland are
normal.  Bilateral inguinal hernias which a fat filled.  No pelvic
lymphadenopathy.
IMPRESSION: Mild acute sigmoid diverticulitis without evidence of perforation
or abscess.

## 2012-08-17 NOTE — Progress Notes (Signed)
Patient scheduled to see Dr. Jill Alexanders Mhoon @ [facility] [phone]) on 09/09/12; POF to schedulers to schedule patient for follow-up with Dr. Truett Perna the week of 09/14/12.

## 2012-08-18 ENCOUNTER — Telehealth: Payer: Self-pay | Admitting: Oncology

## 2012-08-18 NOTE — Telephone Encounter (Signed)
, °

## 2012-09-03 ENCOUNTER — Telehealth: Payer: Self-pay | Admitting: Oncology

## 2012-09-15 ENCOUNTER — Encounter: Payer: Self-pay | Admitting: *Deleted

## 2012-09-22 ENCOUNTER — Telehealth: Payer: Self-pay | Admitting: Oncology

## 2012-09-22 ENCOUNTER — Ambulatory Visit (HOSPITAL_BASED_OUTPATIENT_CLINIC_OR_DEPARTMENT_OTHER): Payer: Medicare Other | Admitting: Oncology

## 2012-09-22 VITALS — BP 109/78 | HR 78 | Temp 97.0°F | Resp 18 | Ht 68.0 in | Wt 182.6 lb

## 2012-09-22 DIAGNOSIS — C719 Malignant neoplasm of brain, unspecified: Secondary | ICD-10-CM

## 2012-09-22 DIAGNOSIS — F4321 Adjustment disorder with depressed mood: Secondary | ICD-10-CM

## 2012-09-22 DIAGNOSIS — C711 Malignant neoplasm of frontal lobe: Secondary | ICD-10-CM

## 2012-09-22 DIAGNOSIS — E538 Deficiency of other specified B group vitamins: Secondary | ICD-10-CM

## 2012-09-22 NOTE — Telephone Encounter (Signed)
gv and printed appt sched and avs for pt  °

## 2012-09-22 NOTE — Progress Notes (Signed)
[place]    OFFICE PROGRESS NOTE   INTERVAL HISTORY:   [name] returns for a scheduled appointment. He continues vaccine therapy at [facility]. He recently had Botox treatment the right foot and has noted improvement in the flexion at one of his toes. He continues physical therapy for the right arm and hand weakness. No change in the right visual field deficit. No new neurologic symptoms. He reports "tingling" in the eyes intermittently and was told this could be related to seizure activity. No other seizures. The Keppra dose was increased based on the visual symptoms potentially representing seizures. The most recent MRI at [facility] on 09/09/2012 revealed a small nodular focus of enhancement at the anterior left temporal lobe that has enlarged compared to an MRI on 07/07/2012. There is a central necrotic component. The lesion measured 6 mm compared to 4 mm. He has been scheduled for an MRI/PET scan this month.  Objective:  Vital signs in last 24 hours:  Blood pressure 109/78, pulse 78, temperature 97 F (36.1 C), temperature source Oral, resp. rate 18, height 5\' 8"  (1.727 m), weight 182 lb 9.6 oz (82.827 kg).   Resp: Lungs clear bilaterally Cardio: Regular rate and rhythm GI: No hepatosplenomegaly Vascular: No leg edema Neuro: Alert, decreased strength at the right hand and right foot. He ambulates with a limp.      Medications: I have reviewed the patient's current medications.  Assessment/Plan: 1. Glioblastoma multiforme. An MRI of the brain during a hospital admission on 03/27/2010 was compared to an MRI from [facility] on 02/22/2010. There was an increase in the abnormal signal of the left posterior opercular region and periatrial region, concerning for progression of tumor. Review of the MRI by Dr. Ferdinand Lango at [facility] confirmed disease progression. Avastin was resumed on 05/28/2010. Hydroxyurea was initiated on 06/07/2010. The hydroxyurea was placed on hold on 06/27/2010 due  to leukopenia and thrombocytopenia.  a. Initiation of salvage therapy with temozolomide and the Celldex vaccine beginning on 08/16/2010.  b. MRI of the brain at [facility] on 10/31/2010 is concerning for disease progression with an interval increase in enhancement surrounding the resection cavity and inferior margin of the putamen and left optic tract. c. MRI of the brain at [facility] on 11/28/2010 showed a slight interval increase in peripheral nodular enhancement surrounding the resection cavity d. He underwent a repeat MRI of the brain at [facility] on 12/26/2010-this showed a stable appearance of the left temporal lobe resection cavity and marginal enhancement. e. Restaging MRI of the brain 12/26/2010 at [facility] with a slight interval decrease in the size of residual left temporal lobe tumor f. Restaging MRI of the brain at [facility] on 02/20/2011 revealed a slight interval decrease in the residual left temporal lobe tumor g. Restaging MRI of the brain at [facility] on 04/03/2011 revealed stable disease. h. MRI 06/12/2011 at [facility] with stable left temporal resection cavity with decreased peripheral enhancement i. He completed a final cycle of temozolomide beginning on 06/27/11 and continues monthly vaccine therapy j. PET scan on may 29th 2013 at [facility] definite foci of hypermetabolic FDG activity identified, in the region of the irregular nodular MRI enhancement in the inferior left temporal lobe there are no definite foci of hypermetabolic activity, there was low level FDG activity in this location similar to the surrounding nonenhancing left temporal lobe k. Restaging MRI of the brain at [facility] on 11/06/2011-stable left brain resection site and? New less than 1 cm left brain lesion, no enhancement on a  PET scan l. Stable radiographic disease on an MRI 12/31/2011 m. Continued treatment with the Celldex vaccine with an MRI of the brain at [facility] on 09/09/2012 revealed overall stable disease aside from a slight increase in a nodular  focus of enhancement the left anterior temporal lobe with central necrosis 2. 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 3. 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 Increase in the Keppra dose 09/09/2012 secondary to the possibility of visual seizures 5. Hypothyroidism, maintained on Synthroid. 6. History of Hypertension, essential hypertension and an effect of Avastin. Improved 7. History of nephrotic syndrome secondary to Avastin. 8. History of progressive thrombocytopenia, status post a bone marrow biopsy on 04/26/2010 with findings of a hypocellular marrow with decreased megakaryocytes and a left shift in the myeloid and erythroid series and minimal dyspoiesis. Cytogenetics returned normal. He again developed thrombocytopenia after receiving Avastin and beginning hydroxyurea in April 2012. The platelet count has improved since the Avastin and hydroxyurea were discontinued.  9. Vitamin B12 deficiency. 10. Acute diverticulitis in April 2012, improved with ciprofloxacin and Flagyl. Related to Avastin (?).  11. Adhesive capsulitis of the right shoulder, followed by Dr. Amedeo Plenty. He is no longer taking pain medication and the range of motion has improved after a physical therapy program.  12. 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 Low serum testosterone level on labs requested by Dr. Ferdinand Lango in November 2012-he is not taking testosterone    Disposition:  He appears stable. He continues monthly vaccine treatment at [facility]. Dr. Ferdinand Lango is following him closely. He is scheduled for a repeat MRI/PET scan later this month. [name] will return for an office visit in 4 months. We will see him sooner as needed.   Betsy Coder, MD  09/22/2012  12:35 PM

## 2012-10-05 ENCOUNTER — Encounter: Payer: Self-pay | Admitting: Internal Medicine

## 2012-10-05 ENCOUNTER — Ambulatory Visit (INDEPENDENT_AMBULATORY_CARE_PROVIDER_SITE_OTHER): Payer: Medicare Other | Admitting: Internal Medicine

## 2012-10-05 VITALS — BP 128/80 | HR 90 | Temp 98.3°F | Resp 20 | Wt 186.0 lb

## 2012-10-05 DIAGNOSIS — L02619 Cutaneous abscess of unspecified foot: Secondary | ICD-10-CM

## 2012-10-05 DIAGNOSIS — R569 Unspecified convulsions: Secondary | ICD-10-CM

## 2012-10-05 DIAGNOSIS — L03115 Cellulitis of right lower limb: Secondary | ICD-10-CM

## 2012-10-05 DIAGNOSIS — I10 Essential (primary) hypertension: Secondary | ICD-10-CM

## 2012-10-05 MED ORDER — AMOXICILLIN-POT CLAVULANATE 875-125 MG PO TABS: 1.0000 | ORAL_TABLET | Freq: Two times a day (BID) | ORAL | Status: DC

## 2012-10-05 NOTE — Patient Instructions (Signed)
Take your antibiotic as prescribed until ALL of it is gone, but stop if you develop a rash, swelling, or any side effects of the medication.  Contact our office as soon as possible if  there are side effects of the medication.Cellulitis Cellulitis is an infection of the skin and the tissue beneath it. The infected area is usually red and tender. Cellulitis occurs most often in the arms and lower legs.  CAUSES  Cellulitis is caused by bacteria that enter the skin through cracks or cuts in the skin. The most common types of bacteria that cause cellulitis are Staphylococcus and Streptococcus. SYMPTOMS   Redness and warmth.  Swelling.  Tenderness or pain.  Fever. DIAGNOSIS  Your caregiver can usually determine what is wrong based on a physical exam. Blood tests may also be done. TREATMENT  Treatment usually involves taking an antibiotic medicine. HOME CARE INSTRUCTIONS   Take your antibiotics as directed. Finish them even if you start to feel better.  Keep the infected arm or leg elevated to reduce swelling.  Apply a warm cloth to the affected area up to 4 times per day to relieve pain.  Only take over-the-counter or prescription medicines for pain, discomfort, or fever as directed by your caregiver.  Keep all follow-up appointments as directed by your caregiver. SEEK MEDICAL CARE IF:   You notice red streaks coming from the infected area.  Your red area gets larger or turns dark in color.  Your bone or joint underneath the infected area becomes painful after the skin has healed.  Your infection returns in the same area or another area.  You notice a swollen bump in the infected area.  You develop new symptoms. SEEK IMMEDIATE MEDICAL CARE IF:   You have a fever.  You feel very sleepy.  You develop vomiting or diarrhea.  You have a general ill feeling (malaise) with muscle aches and pains. MAKE SURE YOU:   Understand these instructions.  Will watch your  condition.  Will get help right away if you are not doing well or get worse. Document Released: 11/14/2004 Document Revised: 08/06/2011 Document Reviewed: 04/22/2011 [employer] Patient Information 2014 [employer].  

## 2012-10-05 NOTE — Progress Notes (Signed)
Subjective:    Patient ID: [name], male    DOB: 11/12/1960, 52 y.o.   MRN: [id number]  HPI  52 year old patient who has a history of GBM and is scheduled for followup brain MRI and PET scanning soon.  He presents with a two-day history of increasing pain itching and swelling involving the dorsal aspect of the right foot. No fever or other constitutional complaints.  Past Medical History  Diagnosis Date  . ALLERGIC RHINITIS 01/09/2009  . HYPERTENSION 01/09/2009  . SEIZURE DISORDER 01/09/2009  . Glioblastoma multiforme     stage IV  . Thyroid disease     Hypothyroidism    History   Social History  . Marital Status: Married    Spouse Name: N/A    Number of Children: N/A  . Years of Education: N/A   Occupational History  . Not on file.   Social History Main Topics  . Smoking status: Never Smoker   . Smokeless tobacco: Never Used  . Alcohol Use: No  . Drug Use: No  . Sexual Activity: Not on file   Other Topics Concern  . Not on file   Social History Narrative  . No narrative on file    Past Surgical History  Procedure Laterality Date  . Craniotomy  2009    glioblastoma    No family history on file.  No Known Allergies  Current Outpatient Prescriptions on File Prior to Visit  Medication Sig Dispense Refill  . CELEBREX 200 MG capsule Take 200 mg by mouth daily.       Marland Kitchen levETIRAcetam (KEPPRA) 500 MG tablet Take 1 tablet (500 mg total) by mouth 2 (two) times daily.  180 tablet  6  . levothyroxine (SYNTHROID, LEVOTHROID) 75 MCG tablet Take 1 tablet (75 mcg total) by mouth daily.  90 tablet  6  . sertraline (ZOLOFT) 25 MG tablet Take 1 tablet (25 mg total) by mouth daily. #30 with refill X 11  90 tablet  4  . STUDY MEDICATION Celldex vaccine per [facility]  #27 on 09/09/12      . vitamin B-12 (CYANOCOBALAMIN) 1000 MCG tablet Take 500 mcg by mouth daily.       . baclofen (LIORESAL) 10 MG tablet Take 10 mg by mouth 3 (three) times daily as needed.      .  [DISCONTINUED] pantoprazole (PROTONIX) 40 MG tablet Take 40 mg by mouth daily.        No current facility-administered medications on file prior to visit.    BP 128/80  Pulse 90  Temp(Src) 98.3 F (36.8 C) (Oral)  Resp 20  Wt 186 lb (84.369 kg)  BMI 28.29 kg/m2  SpO2 96%       Review of Systems  Constitutional: Negative for fever, chills, appetite change and fatigue.  HENT: Negative for hearing loss, ear pain, congestion, sore throat, trouble swallowing, neck stiffness, dental problem, voice change and tinnitus.   Eyes: Negative for pain, discharge and visual disturbance.  Respiratory: Negative for cough, chest tightness, wheezing and stridor.   Cardiovascular: Negative for chest pain, palpitations and leg swelling.  Gastrointestinal: Negative for nausea, vomiting, abdominal pain, diarrhea, constipation, blood in stool and abdominal distention.  Genitourinary: Negative for urgency, hematuria, flank pain, discharge, difficulty urinating and genital sores.  Musculoskeletal: Negative for myalgias, back pain, joint swelling, arthralgias and gait problem.  Skin: Positive for rash.  Neurological: Negative for dizziness, syncope, speech difficulty, weakness, numbness and headaches.  Hematological: Negative for adenopathy. Does not bruise/bleed  easily.  Psychiatric/Behavioral: Negative for behavioral problems and dysphoric mood. The patient is not nervous/anxious.        Objective:   Physical Exam  Constitutional: He appears well-developed and well-nourished. No distress.  Afebrile  Skin:   The dorsal aspect of the right foot most marked laterally was warm to touch erythematous and slightly tender to touch Slight maceration between the right fourth and fifth toes but otherwise no signs of skin breakage          Assessment & Plan:   Right foot cellulitis. Will treat with Augmentin for 10 days Glioblastoma multiform

## 2012-10-15 ENCOUNTER — Encounter: Payer: Self-pay | Admitting: *Deleted

## 2012-10-15 NOTE — Progress Notes (Signed)
Patient received vaccine #28 at [facility] on 10/07/12 per progress note. Returns to [facility] in 4 weeks for next vaccine and in October for MRI and visit.

## 2012-11-05 ENCOUNTER — Other Ambulatory Visit: Payer: Self-pay | Admitting: Internal Medicine

## 2012-11-11 ENCOUNTER — Other Ambulatory Visit: Payer: Self-pay | Admitting: *Deleted

## 2012-11-11 DIAGNOSIS — C719 Malignant neoplasm of brain, unspecified: Secondary | ICD-10-CM

## 2012-11-11 MED ORDER — LEVETIRACETAM 500 MG PO TABS: 500.0000 mg | ORAL_TABLET | Freq: Two times a day (BID) | ORAL | Status: DC

## 2012-11-11 NOTE — Telephone Encounter (Signed)
Called patient & he reports he reduced his Keppra to 500 mg twice daily. Felt taking 500 mg am/1000 mg pm "made me loopy". MD notified.

## 2012-11-11 NOTE — Telephone Encounter (Signed)
Confirmed with Dr. Truett Perna, OK to refill with directions to take 500 mg bid.

## 2012-11-11 NOTE — Addendum Note (Signed)
Addended by: Wandalee Ferdinand on: 11/11/2012 10:56 AM   Modules accepted: Orders

## 2012-11-17 ENCOUNTER — Other Ambulatory Visit: Payer: Self-pay | Admitting: Internal Medicine

## 2013-01-04 ENCOUNTER — Telehealth: Payer: Self-pay | Admitting: *Deleted

## 2013-01-04 NOTE — Telephone Encounter (Signed)
Call from pt to make MD aware he is scheduled for surgery 11/20. Dr. Truett Perna was aware. Appt in this office will be rescheduled.

## 2013-01-06 ENCOUNTER — Other Ambulatory Visit: Payer: Self-pay | Admitting: *Deleted

## 2013-01-06 ENCOUNTER — Telehealth: Payer: Self-pay | Admitting: Oncology

## 2013-01-06 NOTE — Telephone Encounter (Signed)
Called left message regarding  appt forMd visit on 01/20/13

## 2013-01-08 ENCOUNTER — Ambulatory Visit: Payer: Medicare Other | Admitting: Oncology

## 2013-01-17 ENCOUNTER — Other Ambulatory Visit: Payer: Self-pay | Admitting: Internal Medicine

## 2013-01-20 ENCOUNTER — Telehealth: Payer: Self-pay | Admitting: *Deleted

## 2013-01-20 ENCOUNTER — Telehealth: Payer: Self-pay

## 2013-01-20 ENCOUNTER — Ambulatory Visit: Payer: Medicare Other | Admitting: Oncology

## 2013-01-20 NOTE — Telephone Encounter (Signed)
Message from pt's wife asking if he can have his brain biopsy stitches removed in this office. Returned call, pt is scheduled to see Dr. Truett Perna today. She stated they were unaware of appt. Pt sees Dr. Noe Gens 12/10 to review biopsy results. Reviewed with Dr. Truett Perna: Will see pt after 12/10 appt at [facility]. Dr. Truett Perna recommends pt have stitches removed in local neurosurgeon's office. Brain biopsy site should be assessed when stitches removed. Wife voiced understanding. Stated she will take him to [facility] for removal.

## 2013-01-20 NOTE — Telephone Encounter (Signed)
Pt called and r/s md visit to 12/26

## 2013-02-01 ENCOUNTER — Other Ambulatory Visit: Payer: Self-pay | Admitting: *Deleted

## 2013-02-01 MED ORDER — LEVETIRACETAM 500 MG PO TABS: ORAL_TABLET | ORAL | Status: DC

## 2013-02-04 ENCOUNTER — Ambulatory Visit: Payer: Medicare Other

## 2013-02-04 DIAGNOSIS — Z23 Encounter for immunization: Secondary | ICD-10-CM

## 2013-02-10 ENCOUNTER — Ambulatory Visit (INDEPENDENT_AMBULATORY_CARE_PROVIDER_SITE_OTHER): Payer: Medicare Other | Admitting: Internal Medicine

## 2013-02-10 ENCOUNTER — Encounter: Payer: Self-pay | Admitting: Internal Medicine

## 2013-02-10 VITALS — BP 120/80 | HR 84 | Temp 98.5°F | Wt 188.0 lb

## 2013-02-10 DIAGNOSIS — R569 Unspecified convulsions: Secondary | ICD-10-CM

## 2013-02-10 DIAGNOSIS — C719 Malignant neoplasm of brain, unspecified: Secondary | ICD-10-CM

## 2013-02-10 DIAGNOSIS — I10 Essential (primary) hypertension: Secondary | ICD-10-CM

## 2013-02-10 DIAGNOSIS — J309 Allergic rhinitis, unspecified: Secondary | ICD-10-CM

## 2013-02-10 LAB — CBC WITH DIFFERENTIAL/PLATELET
Basophils Absolute: 0 10*3/uL (ref 0.0–0.1)
Eosinophils Absolute: 0 10*3/uL (ref 0.0–0.7)
Hemoglobin: 11.4 g/dL — ABNORMAL LOW (ref 13.0–17.0)
Lymphocytes Relative: 22.7 % (ref 12.0–46.0)
MCHC: 33.7 g/dL (ref 30.0–36.0)
Monocytes Absolute: 0.4 10*3/uL (ref 0.1–1.0)
Neutro Abs: 2.5 10*3/uL (ref 1.4–7.7)
Neutrophils Relative %: 65 % (ref 43.0–77.0)
RBC: 3.49 Mil/uL — ABNORMAL LOW (ref 4.22–5.81)
RDW: 14.4 % (ref 11.5–14.6)
WBC: 3.8 10*3/uL — ABNORMAL LOW (ref 4.5–10.5)

## 2013-02-10 LAB — COMPREHENSIVE METABOLIC PANEL
ALT: 30 U/L (ref 0–53)
AST: 23 U/L (ref 0–37)
Albumin: 3.8 g/dL (ref 3.5–5.2)
Alkaline Phosphatase: 83 U/L (ref 39–117)
BUN: 18 mg/dL (ref 6–23)
Calcium: 8.8 mg/dL (ref 8.4–10.5)
Chloride: 109 mEq/L (ref 96–112)
Creatinine, Ser: 1 mg/dL (ref 0.4–1.5)
Potassium: 3.9 mEq/L (ref 3.5–5.1)

## 2013-02-10 LAB — TSH: TSH: 0.6 u[IU]/mL (ref 0.35–5.50)

## 2013-02-10 NOTE — Progress Notes (Signed)
Subjective:    Patient ID: [name], male    DOB: December 02, 1960, 52 y.o.   MRN: [id number]  HPI  52 year old patient who has a history of hypertension. On November 21 he underwent exploratory craniotomy due to the history of a GBM which apparently revealed benign pathology. He has had persistent fatigue and his physicians at [facility] requested a TSH. He is on supplemental medication and has been compliant. He admits to a lack of activity and concerns about deconditioning. No fever or other constitutional complaints.  Past Medical History  Diagnosis Date  . ALLERGIC RHINITIS 01/09/2009  . HYPERTENSION 01/09/2009  . SEIZURE DISORDER 01/09/2009  . Glioblastoma multiforme     stage IV  . Thyroid disease     Hypothyroidism    History   Social History  . Marital Status: Married    Spouse Name: N/A    Number of Children: N/A  . Years of Education: N/A   Occupational History  . Not on file.   Social History Main Topics  . Smoking status: Never Smoker   . Smokeless tobacco: Never Used  . Alcohol Use: No  . Drug Use: No  . Sexual Activity: Not on file   Other Topics Concern  . Not on file   Social History Narrative  . No narrative on file    Past Surgical History  Procedure Laterality Date  . Craniotomy  2009    glioblastoma    History reviewed. No pertinent family history.  No Known Allergies  Current Outpatient Prescriptions on File Prior to Visit  Medication Sig Dispense Refill  . CELEBREX 200 MG capsule take 1 capsule by mouth twice a day with food  60 capsule  4  . levETIRAcetam (KEPPRA) 500 MG tablet Take #1 (500mg ) in am and #2 (1000mg ) in pm to prevent seizure  270 tablet  0  . levothyroxine (SYNTHROID, LEVOTHROID) 75 MCG tablet take 1 tablet by mouth once daily  90 tablet  1  . sertraline (ZOLOFT) 25 MG tablet take 1 tablet by mouth once daily  90 tablet  1  . STUDY MEDICATION Celldex vaccine per [facility]  #27 on 09/09/12      . vitamin B-12 (CYANOCOBALAMIN)  1000 MCG tablet Take 500 mcg by mouth daily.       . baclofen (LIORESAL) 10 MG tablet Take 10 mg by mouth 3 (three) times daily as needed.      . [DISCONTINUED] pantoprazole (PROTONIX) 40 MG tablet Take 40 mg by mouth daily.        No current facility-administered medications on file prior to visit.    BP 120/80  Pulse 84  Temp(Src) 98.5 F (36.9 C) (Oral)  Wt 188 lb (85.276 kg)       Review of Systems  Constitutional: Positive for activity change and fatigue. Negative for fever, chills and appetite change.  HENT: Negative for congestion, dental problem, ear pain, hearing loss, sore throat, tinnitus, trouble swallowing and voice change.   Eyes: Negative for pain, discharge and visual disturbance.  Respiratory: Negative for cough, chest tightness, wheezing and stridor.   Cardiovascular: Negative for chest pain, palpitations and leg swelling.  Gastrointestinal: Negative for nausea, vomiting, abdominal pain, diarrhea, constipation, blood in stool and abdominal distention.  Genitourinary: Negative for urgency, hematuria, flank pain, discharge, difficulty urinating and genital sores.  Musculoskeletal: Negative for arthralgias, back pain, gait problem, joint swelling, myalgias and neck stiffness.  Skin: Negative for rash.  Neurological: Positive for weakness. Negative for  dizziness, syncope, speech difficulty, numbness and headaches.  Hematological: Negative for adenopathy. Does not bruise/bleed easily.  Psychiatric/Behavioral: Negative for behavioral problems and dysphoric mood. The patient is not nervous/anxious.        Objective:   Physical Exam  Constitutional: He is oriented to person, place, and time. He appears well-developed.  HENT:  Head: Normocephalic.  Right Ear: External ear normal.  Left Ear: External ear normal.  Eyes: Conjunctivae and EOM are normal.  Neck: Normal range of motion.  Cardiovascular: Normal rate and normal heart sounds.   Pulmonary/Chest: Breath  sounds normal.  Abdominal: Bowel sounds are normal.  Musculoskeletal: Normal range of motion. He exhibits no edema and no tenderness.  Neurological: He is alert and oriented to person, place, and time.  Right hemiparesis Normal speech  Skin:  Healing left temporal scalp incision  Psychiatric: He has a normal mood and affect. His behavior is normal.          Assessment & Plan:   Status post  exploration for L GBM Fatigue. We'll check TSH and screening lab. We'll regular exercise encouraged Hypertension stable

## 2013-02-10 NOTE — Patient Instructions (Signed)
Limit your sodium (Salt) intake    It is important that you exercise regularly, at least 20 minutes 3 to 4 times per week.  If you develop chest pain or shortness of breath seek  medical attention.  You need to lose weight.  Consider a lower calorie diet and regular exercise.  Return in 6 months for follow-up   

## 2013-02-12 ENCOUNTER — Ambulatory Visit (HOSPITAL_BASED_OUTPATIENT_CLINIC_OR_DEPARTMENT_OTHER): Payer: Medicare Other | Admitting: Oncology

## 2013-02-12 ENCOUNTER — Telehealth: Payer: Self-pay | Admitting: Oncology

## 2013-02-12 ENCOUNTER — Other Ambulatory Visit: Payer: Self-pay | Admitting: Oncology

## 2013-02-12 VITALS — BP 110/84 | HR 100 | Temp 97.6°F | Wt 185.9 lb

## 2013-02-12 DIAGNOSIS — R5381 Other malaise: Secondary | ICD-10-CM

## 2013-02-12 DIAGNOSIS — F4321 Adjustment disorder with depressed mood: Secondary | ICD-10-CM

## 2013-02-12 DIAGNOSIS — E538 Deficiency of other specified B group vitamins: Secondary | ICD-10-CM

## 2013-02-12 DIAGNOSIS — C712 Malignant neoplasm of temporal lobe: Secondary | ICD-10-CM

## 2013-02-12 DIAGNOSIS — C719 Malignant neoplasm of brain, unspecified: Secondary | ICD-10-CM

## 2013-02-12 NOTE — Progress Notes (Signed)
[name]    OFFICE PROGRESS NOTE   INTERVAL HISTORY:   Mr. Schwinn returns for scheduled followup of the glioblastoma multi-forme. He underwent a brain biopsy on 01/07/2013. Per the [facility] oncology notes the biopsy was negative for GBM. The final pathology report is not available in EPIC.  He reports increased right-sided weakness since surgery. He had transient expressive aphasia. The aphasia has resolved. He complains of malaise.  He received treatment #32 with the Celldex vaccine on 01/27/2013. He was noted to be tachycardic. A CT of the chest revealed no pulmonary embolism. He reports "cramping "in the legs following the brain biopsy. This has resolved.  Objective:  Vital signs in last 24 hours:  Blood pressure 110/84, pulse 100, temperature 97.6 F (36.4 C), temperature source Oral, weight 185 lb 14.4 oz (84.324 kg).    HEENT: No thrush Resp: Lungs clear bilaterally Cardio: Regular rate and rhythm (repeat manual pulse 80) GI: No hepatosplenomegaly Vascular: No leg edema, the left lower leg is larger than the right side Neuro: Alert and oriented, decreased strength in the right arm and hand. Decreased strength with dorsi flexion at the right foot and right hip flexion. He walks with a limp.   Lab Results:  Lab Results  Component Value Date   WBC 3.8* 02/10/2013   HGB 11.4* 02/10/2013   HCT 34.0* 02/10/2013   MCV 97.3 02/10/2013   PLT 183.0 02/10/2013   NEUTROABS 2.5 02/10/2013   ANC 2.5   Medications: I have reviewed the patient's current medications.  Assessment/Plan: 1. Glioblastoma multiforme. An MRI of the brain during a hospital admission on 03/27/2010 was compared to an MRI from [place] on 02/22/2010. There was an increase in the abnormal signal of the left posterior opercular region and periatrial region, concerning for progression of tumor. Review of the MRI by Dr. Ferdinand Lango at [facility] confirmed disease progression. Avastin was resumed on  05/28/2010. Hydroxyurea was initiated on 06/07/2010. The hydroxyurea was placed on hold on 06/27/2010 due to leukopenia and thrombocytopenia.  a. Initiation of salvage therapy with temozolomide and the Celldex vaccine beginning on 08/16/2010.  b. MRI of the brain at [facility] on 10/31/2010 is concerning for disease progression with an interval increase in enhancement surrounding the resection cavity and inferior margin of the putamen and left optic tract. c. MRI of the brain at [facility] on 11/28/2010 showed a slight interval increase in peripheral nodular enhancement surrounding the resection cavity d. He underwent a repeat MRI of the brain at [facility] on 12/26/2010-this showed a stable appearance of the left temporal lobe resection cavity and marginal enhancement. e. Restaging MRI of the brain 12/26/2010 at [facility] with a slight interval decrease in the size of residual left temporal lobe tumor f. Restaging MRI of the brain at [facility] on 02/20/2011 revealed a slight interval decrease in the residual left temporal lobe tumor g. Restaging MRI of the brain at [facility] on 04/03/2011 revealed stable disease. h. MRI 06/12/2011 at [facility] with stable left temporal resection cavity with decreased peripheral enhancement i. He completed a final cycle of temozolomide beginning on 06/27/11 and continues monthly vaccine therapy j. PET scan on may 29th 2013 at [facility] definite foci of hypermetabolic FDG activity identified, in the region of the irregular nodular MRI enhancement in the inferior left temporal lobe there are no definite foci of hypermetabolic activity, there was low level FDG activity in this location similar to the surrounding nonenhancing left temporal lobe k. Restaging MRI of the brain at [facility]  on 11/06/2011-stable left brain resection site and? New less than 1 cm left brain lesion, no enhancement on a PET scan l. Stable radiographic disease on an MRI 12/31/2011 m. Continued treatment with the Celldex vaccine with an MRI of the  brain at [facility] on 09/09/2012 revealed overall stable disease aside from a slight increase in a nodular focus of enhancement the left anterior temporal lobe with central necrosis n. MRI 12/02/2012 with increased enhancement in the anterior left temporal area o. MRI guided left temporal resection 01/07/2013-negative for tumor p. Continuation of treatment on the Celldex vaccine, treatment #32 on 01/27/2013 2. 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 3. 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 Increase in the Keppra dose 09/09/2012 secondary to the possibility of visual seizures 5. Hypothyroidism, maintained on Synthroid. 6. History of Hypertension, essential hypertension and an effect of Avastin. Improved 7. History of nephrotic syndrome secondary to Avastin. 8. History of progressive thrombocytopenia, status post a bone marrow biopsy on 04/26/2010 with findings of a hypocellular marrow with decreased megakaryocytes and a left shift in the myeloid and erythroid series and minimal dyspoiesis. Cytogenetics returned normal. He again developed thrombocytopenia after receiving Avastin and beginning hydroxyurea in April 2012. The platelet count has improved since the Avastin and hydroxyurea were discontinued.  9. Vitamin B12 deficiency. 10. Acute diverticulitis in April 2012, improved with ciprofloxacin and Flagyl. Related to Avastin (?).  11. Adhesive capsulitis of the right shoulder, followed by Dr. Amedeo Plenty. He is no longer taking pain medication and the range of motion has improved after a physical therapy program.  12. 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 Low serum testosterone level on labs requested by Dr. Ferdinand Lango in November 2012-he is not taking testosterone   Disposition:  He underwent a  brain biopsy 01/07/2013 and there was no recurrent GBM. He continues monthly vaccine treatment and close clinical followup in the brain tumor clinic at [facility]. He will discuss the indication for physical therapy with Dr. Ferdinand Lango since he has developed increased right-sided weakness after undergoing the repeat brain resection.  Mr. Ledbetter will return for an office visit here in 4 months. We are available to see him sooner as needed.   Betsy Coder, MD  02/12/2013  1:56 PM

## 2013-02-12 NOTE — Telephone Encounter (Signed)
gv and printed appt sched and avs for pt for April 2015  °

## 2013-02-24 ENCOUNTER — Telehealth: Payer: Self-pay | Admitting: Internal Medicine

## 2013-02-24 NOTE — Telephone Encounter (Signed)
Pt would like blood work results °

## 2013-02-25 NOTE — Telephone Encounter (Signed)
Left message on voicemail to call office.  

## 2013-02-25 NOTE — Telephone Encounter (Signed)
Pt called back told him lab results normal except very mild stable anemia per Dr. Raliegh Ip. Pt verbalized understanding.

## 2013-02-25 NOTE — Telephone Encounter (Signed)
Please call/notify patient that lab/test/procedure is normal except for very mild stable anemia

## 2013-03-22 ENCOUNTER — Ambulatory Visit: Payer: Medicare Other | Attending: Oncology | Admitting: Physical Therapy

## 2013-03-22 DIAGNOSIS — Z85841 Personal history of malignant neoplasm of brain: Secondary | ICD-10-CM | POA: Insufficient documentation

## 2013-03-22 DIAGNOSIS — M6281 Muscle weakness (generalized): Secondary | ICD-10-CM | POA: Insufficient documentation

## 2013-03-22 DIAGNOSIS — IMO0001 Reserved for inherently not codable concepts without codable children: Secondary | ICD-10-CM | POA: Insufficient documentation

## 2013-03-22 DIAGNOSIS — R269 Unspecified abnormalities of gait and mobility: Secondary | ICD-10-CM | POA: Insufficient documentation

## 2013-03-22 DIAGNOSIS — Z9181 History of falling: Secondary | ICD-10-CM | POA: Insufficient documentation

## 2013-03-24 ENCOUNTER — Telehealth: Payer: Self-pay | Admitting: Oncology

## 2013-03-24 NOTE — Telephone Encounter (Signed)
Called pt left message regarding appt moved to am due to call, mailed appt to pt

## 2013-03-30 ENCOUNTER — Ambulatory Visit: Payer: Medicare Other | Admitting: Physical Therapy

## 2013-04-01 ENCOUNTER — Ambulatory Visit: Payer: Medicare Other | Admitting: Physical Therapy

## 2013-04-06 ENCOUNTER — Ambulatory Visit: Payer: Medicare Other | Admitting: Physical Therapy

## 2013-04-08 ENCOUNTER — Ambulatory Visit: Payer: Medicare Other | Admitting: Physical Therapy

## 2013-04-13 ENCOUNTER — Ambulatory Visit: Payer: Medicare Other | Admitting: Physical Therapy

## 2013-04-13 ENCOUNTER — Ambulatory Visit: Payer: Medicare Other | Admitting: Occupational Therapy

## 2013-04-15 ENCOUNTER — Ambulatory Visit: Payer: Medicare Other | Admitting: Physical Therapy

## 2013-04-20 ENCOUNTER — Ambulatory Visit: Payer: Medicare Other | Attending: Oncology | Admitting: Physical Therapy

## 2013-04-20 DIAGNOSIS — Z85841 Personal history of malignant neoplasm of brain: Secondary | ICD-10-CM | POA: Insufficient documentation

## 2013-04-20 DIAGNOSIS — Z9181 History of falling: Secondary | ICD-10-CM | POA: Insufficient documentation

## 2013-04-20 DIAGNOSIS — M6281 Muscle weakness (generalized): Secondary | ICD-10-CM | POA: Insufficient documentation

## 2013-04-20 DIAGNOSIS — IMO0001 Reserved for inherently not codable concepts without codable children: Secondary | ICD-10-CM | POA: Insufficient documentation

## 2013-04-20 DIAGNOSIS — R269 Unspecified abnormalities of gait and mobility: Secondary | ICD-10-CM | POA: Insufficient documentation

## 2013-04-22 ENCOUNTER — Ambulatory Visit: Payer: Medicare Other | Admitting: Physical Therapy

## 2013-04-22 ENCOUNTER — Ambulatory Visit: Payer: Medicare Other | Admitting: Occupational Therapy

## 2013-04-26 ENCOUNTER — Ambulatory Visit: Payer: Medicare Other | Admitting: Physical Therapy

## 2013-04-28 ENCOUNTER — Ambulatory Visit: Payer: Medicare Other | Admitting: Occupational Therapy

## 2013-05-05 ENCOUNTER — Ambulatory Visit: Payer: Medicare Other

## 2013-05-07 ENCOUNTER — Ambulatory Visit: Payer: Medicare Other

## 2013-05-10 ENCOUNTER — Ambulatory Visit: Payer: Medicare Other | Admitting: Occupational Therapy

## 2013-05-13 ENCOUNTER — Ambulatory Visit: Payer: Medicare Other | Admitting: Occupational Therapy

## 2013-05-17 ENCOUNTER — Other Ambulatory Visit: Payer: Self-pay | Admitting: Internal Medicine

## 2013-05-18 ENCOUNTER — Encounter: Payer: Medicare Other | Admitting: Occupational Therapy

## 2013-05-20 ENCOUNTER — Encounter: Payer: Medicare Other | Admitting: Occupational Therapy

## 2013-05-31 ENCOUNTER — Ambulatory Visit: Payer: Medicare Other | Admitting: Occupational Therapy

## 2013-06-03 ENCOUNTER — Encounter: Payer: Medicare Other | Admitting: Occupational Therapy

## 2013-06-15 ENCOUNTER — Ambulatory Visit: Payer: Medicare Other | Admitting: Oncology

## 2013-07-20 ENCOUNTER — Other Ambulatory Visit: Payer: Self-pay | Admitting: Internal Medicine

## 2013-09-06 ENCOUNTER — Other Ambulatory Visit: Payer: Self-pay | Admitting: *Deleted

## 2013-09-06 DIAGNOSIS — C719 Malignant neoplasm of brain, unspecified: Secondary | ICD-10-CM

## 2013-09-06 MED ORDER — LEVETIRACETAM 500 MG PO TABS: ORAL_TABLET | ORAL | Status: DC

## 2013-11-16 ENCOUNTER — Other Ambulatory Visit: Payer: Self-pay | Admitting: Internal Medicine

## 2013-12-06 ENCOUNTER — Other Ambulatory Visit: Payer: Self-pay | Admitting: *Deleted

## 2013-12-06 DIAGNOSIS — C719 Malignant neoplasm of brain, unspecified: Secondary | ICD-10-CM

## 2013-12-07 MED ORDER — LEVETIRACETAM 500 MG PO TABS: ORAL_TABLET | ORAL | Status: DC

## 2014-01-17 ENCOUNTER — Other Ambulatory Visit: Payer: Self-pay | Admitting: Internal Medicine

## 2014-01-21 ENCOUNTER — Ambulatory Visit (INDEPENDENT_AMBULATORY_CARE_PROVIDER_SITE_OTHER): Payer: Medicare Other | Admitting: Family Medicine

## 2014-01-21 ENCOUNTER — Encounter: Payer: Self-pay | Admitting: Family Medicine

## 2014-01-21 ENCOUNTER — Ambulatory Visit: Payer: Medicare Other | Admitting: Family

## 2014-01-21 VITALS — BP 118/82 | HR 87 | Temp 98.5°F | Ht 68.0 in | Wt 187.8 lb

## 2014-01-21 DIAGNOSIS — J069 Acute upper respiratory infection, unspecified: Secondary | ICD-10-CM

## 2014-01-21 DIAGNOSIS — J029 Acute pharyngitis, unspecified: Secondary | ICD-10-CM

## 2014-01-21 LAB — POCT RAPID STREP A (OFFICE): Rapid Strep A Screen: NEGATIVE

## 2014-01-21 MED ORDER — AZITHROMYCIN 250 MG PO TABS: ORAL_TABLET | ORAL | Status: DC

## 2014-01-21 NOTE — Progress Notes (Signed)
Pre visit review using our clinic review tool, if applicable. No additional management support is needed unless otherwise documented below in the visit note. 

## 2014-01-21 NOTE — Patient Instructions (Signed)
BEFORE YOU LEAVE: -strep test -schedule physical with Dr. Burnice Logan  -As we discussed, we have prescribed a new medication (azithromycin) for you at this appointment. We discussed the common and serious potential adverse effects of this medication and you can review these and more with the pharmacist when you pick up your medication.  Please follow the instructions for use carefully and notify us immediately if you have any problems taking this medication.

## 2014-01-21 NOTE — Progress Notes (Signed)
HPI:  -started: about 4 days ago -symptoms:nasal congestion, sore throat, cough, chills on and off -denies:fever >99, SOB, NVD, tooth pain -has tried: musinex -sick contacts/travel/risks: denies flu exposure, tick exposure or or Ebola risks -Hx of: allergies, feports also on chronic treatments (1x per month - he is unsure of medication) at [facility] for hx brain cancer and reports requires abx whenever sick due to mild immunocompromized state -he reports recurrent strep and wants z pack  ROS: See pertinent positives and negatives per HPI.  Past Medical History  Diagnosis Date  . ALLERGIC RHINITIS 01/09/2009  . HYPERTENSION 01/09/2009  . SEIZURE DISORDER 01/09/2009  . Glioblastoma multiforme     stage IV  . Thyroid disease     Hypothyroidism    Past Surgical History  Procedure Laterality Date  . Craniotomy  2009    glioblastoma    No family history on file.  History   Social History  . Marital Status: Married    Spouse Name: N/A    Number of Children: N/A  . Years of Education: N/A   Social History Main Topics  . Smoking status: Never Smoker   . Smokeless tobacco: Never Used  . Alcohol Use: No  . Drug Use: No  . Sexual Activity: None   Other Topics Concern  . None   Social History Narrative    Current outpatient prescriptions: CELEBREX 200 MG capsule, take 1 capsule by mouth twice a day with food, Disp: 60 capsule, Rfl: 4;  levETIRAcetam (KEPPRA) 500 MG tablet, TAKE ONE TABLET BY MOUTH TWICE A DAY, Disp: 60 tablet, Rfl: 2;  levothyroxine (SYNTHROID, LEVOTHROID) 75 MCG tablet, take 1 tablet by mouth once daily, Disp: 90 tablet, Rfl: 0;  Potassium (GNP POTASSIUM) 99 MG TABS, Take by mouth., Disp: , Rfl:  sertraline (ZOLOFT) 25 MG tablet, take 1 tablet by mouth once daily, Disp: 90 tablet, Rfl: 0;  vitamin B-12 (CYANOCOBALAMIN) 1000 MCG tablet, Take 500 mcg by mouth daily. , Disp: , Rfl: ;  azithromycin (ZITHROMAX) 250 MG tablet, 2 tabs on first day the 1 tab daily for  4 more days, Disp: 6 tablet, Rfl: 0;  [DISCONTINUED] pantoprazole (PROTONIX) 40 MG tablet, Take 40 mg by mouth daily. , Disp: , Rfl:   EXAM:  Filed Vitals:   01/21/14 1540  BP: 118/82  Pulse: 87  Temp: 98.5 F (36.9 C)    Body mass index is 28.56 kg/(m^2).  GENERAL: vitals reviewed and listed above, alert, oriented, appears well hydrated and in no acute distress  HEENT: atraumatic, conjunttiva clear, no obvious abnormalities on inspection of external nose and ears, normal appearance of ear canals and TMs, clear nasal congestion, mild post oropharyngeal erythema with PND, no tonsillar edema or exudate, no sinus TTP  NECK: no obvious masses on inspection  LUNGS: clear to auscultation bilaterally, no wheezes, rales or rhonchi, good air movement  CV: HRRR, no peripheral edema  MS: moves all extremities without noticeable abnormality  PSYCH: pleasant and cooperative, no obvious depression or anxiety  ASSESSMENT AND PLAN:  Discussed the following assessment and plan:  Sore throat - Plan: azithromycin (ZITHROMAX) 250 MG tablet  Acute pharyngitis, unspecified pharyngitis type - Plan: azithromycin (ZITHROMAX) 250 MG tablet  Acute upper respiratory infection - Plan: azithromycin (ZITHROMAX) 250 MG tablet  -given HPI and exam findings today, a serious infection or illness is unlikely. We discussed potential etiologies, with VURI being most likely, and advised supportive care and monitoring. We discussed treatment side effects, likely course,  antibiotic misuse, transmission, and signs of developing a serious illness. -rapid strep per his concern and zpack to cover -of course, we advised to return or notify a doctor immediately if symptoms worsen or persist or new concerns arise.    Patient Instructions  BEFORE YOU LEAVE: -strep test -schedule physical with Dr. Burnice Logan  -As we discussed, we have prescribed a new medication (azithromycin) for you at this appointment. We  discussed the common and serious potential adverse effects of this medication and you can review these and more with the pharmacist when you pick up your medication.  Please follow the instructions for use carefully and notify us immediately if you have any problems taking this medication.      Colin Benton R.

## 2014-01-21 NOTE — Addendum Note (Signed)
Addended by: Agnes Lawrence on: 01/21/2014 04:18 PM   Modules accepted: Orders

## 2014-01-31 ENCOUNTER — Ambulatory Visit (INDEPENDENT_AMBULATORY_CARE_PROVIDER_SITE_OTHER): Payer: Medicare Other | Admitting: *Deleted

## 2014-01-31 DIAGNOSIS — Z23 Encounter for immunization: Secondary | ICD-10-CM

## 2014-02-19 ENCOUNTER — Other Ambulatory Visit: Payer: Self-pay | Admitting: Internal Medicine

## 2014-03-02 ENCOUNTER — Telehealth: Payer: Self-pay | Admitting: Oncology

## 2014-03-02 NOTE — Telephone Encounter (Signed)
Pt called to r/s April 2015 apt, pt confirmed  MD visit , mailed pt schedule... KJ

## 2014-03-07 ENCOUNTER — Other Ambulatory Visit: Payer: Self-pay | Admitting: *Deleted

## 2014-03-07 DIAGNOSIS — C719 Malignant neoplasm of brain, unspecified: Secondary | ICD-10-CM

## 2014-03-07 MED ORDER — LEVETIRACETAM 500 MG PO TABS: ORAL_TABLET | ORAL | Status: DC

## 2014-03-31 ENCOUNTER — Ambulatory Visit (HOSPITAL_BASED_OUTPATIENT_CLINIC_OR_DEPARTMENT_OTHER): Payer: Medicare Other | Admitting: Oncology

## 2014-03-31 ENCOUNTER — Telehealth: Payer: Self-pay | Admitting: Oncology

## 2014-03-31 VITALS — BP 136/87 | HR 96 | Temp 98.4°F | Resp 18 | Ht 68.0 in | Wt 187.7 lb

## 2014-03-31 DIAGNOSIS — F4321 Adjustment disorder with depressed mood: Secondary | ICD-10-CM

## 2014-03-31 DIAGNOSIS — E538 Deficiency of other specified B group vitamins: Secondary | ICD-10-CM

## 2014-03-31 DIAGNOSIS — C712 Malignant neoplasm of temporal lobe: Secondary | ICD-10-CM

## 2014-03-31 DIAGNOSIS — C719 Malignant neoplasm of brain, unspecified: Secondary | ICD-10-CM

## 2014-03-31 NOTE — Telephone Encounter (Signed)
Gave avs & calendar for October. °

## 2014-03-31 NOTE — Progress Notes (Signed)
[place] OFFICE PROGRESS NOTE   Diagnosis: Glioblastoma  INTERVAL HISTORY:   [name] returns for a scheduled visit. He was last seen here in December 2014. He continues treatment on a vaccine study at [facility]. An MRI 03/08/2014 revealed no evidence of disease progression. He had a fall and suffered a subdural hemorrhage in May 2015. He underwent surgical evacuation at [facility].  He reports feeling very well. Good appetite and energy level. No new neurologic symptoms. He is being tapered off of Keppra.  Yesterday evening approximately 10 PM he fell while getting out of the car. He thinks he may have "blacked out ". He has a bruise at the left frontal and right parietal scalp. No new neurologic symptoms. His wife found him on the ground beside the car. No witnessed seizure activity. He reports having a few glasses of wine prior to this event. He has chronic difficulty getting out of the car and feels that he slipped when trying to lift his legs out of the car.  Objective:  Vital signs in last 24 hours:  Blood pressure 136/87, pulse 96, temperature 98.4 F (36.9 C), temperature source Oral, resp. rate 18, height 5' 8"  (1.727 m), weight 187 lb 11.2 oz (85.14 kg), SpO2 99 %.    HEENT: Abrasion at the right parietal scalp and left frontal scalp. Small ecchymosis associated with the left frontal scalp abrasion Resp: Lungs clear bilaterally Cardio: Regular rate and rhythm GI: No hepatomegaly, nontender Vascular: No leg edema Neuro: Alert, fluid speech, some difficulty with word recall. 3-4 /5 strength in the right arm and hand, 4/5 strength in the right leg and foot. He follows commands and ambulates.     Medications: I have reviewed the patient's current medications.  Assessment/Plan: 1. 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 An MRI of the brain during a hospital admission on 03/27/2010 was compared to an MRI from [place] on 02/22/2010. There was an increase in the  abnormal signal of the left posterior opercular region and periatrial region, concerning for progression of tumor. Review of the MRI by [name] at [facility] confirmed disease progression. Avastin was resumed on 05/28/2010. Hydroxyurea was initiated on 06/07/2010. The hydroxyurea was placed on hold on 06/27/2010 due to leukopenia and thrombocytopenia.  1. Initiation of salvage therapy with temozolomide and the Celldex vaccine beginning on 08/16/2010.  2. MRI of the brain at [facility] on 10/31/2010 is concerning for disease progression with an interval increase in enhancement surrounding the resection cavity and inferior margin of the putamen and left optic tract. 3. MRI of the brain at [facility] on 11/28/2010 showed a slight interval increase in peripheral nodular enhancement surrounding the resection cavity 4. He underwent a repeat MRI of the brain at [facility] on 12/26/2010-this showed a stable appearance of the left temporal lobe resection cavity and marginal enhancement. 5. Restaging MRI of the brain 12/26/2010 at [facility] with a slight interval decrease in the size of residual left temporal lobe tumor 6. Restaging MRI of the brain at [facility] on 02/20/2011 revealed a slight interval decrease in the residual left temporal lobe tumor 7. Restaging MRI of the brain at [facility] on 04/03/2011 revealed stable disease. 8. MRI 06/12/2011 at [facility] with stable left temporal resection cavity with decreased peripheral enhancement 9. He completed a final cycle of temozolomide beginning on 06/27/11 and continues monthly vaccine therapy 10. PET scan on may 29th 2013 at [facility] definite foci of hypermetabolic FDG activity identified, in the region of the irregular nodular MRI enhancement  in the inferior left temporal lobe there are no definite foci of hypermetabolic activity, there was low level FDG activity in this location similar to the surrounding nonenhancing left temporal lobe 11. Restaging MRI of the brain at [facility] on 11/06/2011-stable left  brain resection site and? New less than 1 cm left brain lesion, no enhancement on a PET scan 12. Stable radiographic disease on an MRI 12/31/2011 13. Continued treatment with the Celldex vaccine with an MRI of the brain at [facility] on 09/09/2012 revealed overall stable disease aside from a slight increase in a nodular focus of enhancement the left anterior temporal lobe with central necrosis 14. MRI 12/02/2012 with increased enhancement in the anterior left temporal area 15. MRI guided left temporal resection 01/07/2013-negative for tumor 16. Continuation of treatment on the Celldex vaccine, treatment #32 on 01/27/2013 17. Restaging MRI at [facility] 03/08/2014 with no evidence of disease progression 18. Dose #46 of the Celldex vaccine on 03/08/2014 2. 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 3. 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 Increase in the Keppra dose 09/09/2012 secondary to the possibility of visual seizures 5. Hypothyroidism, maintained on Synthroid. 6. History of Hypertension, essential hypertension and an effect of Avastin. Improved 7. History of nephrotic syndrome secondary to Avastin. 8. History of progressive thrombocytopenia, status post a bone marrow biopsy on 04/26/2010 with findings of a hypocellular marrow with decreased megakaryocytes and a left shift in the myeloid and erythroid series and minimal dyspoiesis. Cytogenetics returned normal. He again developed thrombocytopenia after receiving Avastin and beginning hydroxyurea in April 2012. The platelet count has improved since the Avastin and hydroxyurea were discontinued.  9. Vitamin B12 deficiency. 10. Acute diverticulitis in April 2012, improved with ciprofloxacin and Flagyl. Related to Avastin (?).  11. Adhesive capsulitis of the right  shoulder, followed by [name]. He is no longer taking pain medication and the range of motion has improved after a physical therapy program.  12. 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 Low serum testosterone level on labs requested by [name] in November 2012-he is not taking testosterone        14. Subdural hemorrhage after a fall requiring surgical evacuation in May 2015       15. Fall outside of his car 03/30/2014 with scalp abrasion/ecchymoses    Disposition:  There is no clinical evidence for progression of the glioblastoma. I doubt the fall last night was related to a seizure, but this is possible. There is no clinical evidence of a CNS hemorrhage at present. We will contact [name] to get her opinion regarding the need for a brain scan. He is scheduled for a follow-up appointment at [facility] next week. [name] wife knows to contact us for new neurologic symptoms, nausea, or headache. I recommended he not drive for the next several days.  He will return for a routine office visit here in 8 months. We are available to see him sooner as needed. Betsy Coder, MD  03/31/2014  1:42 PM

## 2014-04-01 ENCOUNTER — Telehealth: Payer: Self-pay | Admitting: *Deleted

## 2014-04-01 NOTE — Telephone Encounter (Signed)
NO CLINICAL EVIDENCE OF CNS HEMORRHAGE.

## 2014-04-15 ENCOUNTER — Other Ambulatory Visit: Payer: Self-pay | Admitting: Internal Medicine

## 2014-04-22 ENCOUNTER — Encounter: Payer: Medicare Other | Admitting: Internal Medicine

## 2014-05-15 ENCOUNTER — Other Ambulatory Visit: Payer: Self-pay | Admitting: Internal Medicine

## 2014-06-15 ENCOUNTER — Encounter: Payer: Medicare Other | Admitting: Internal Medicine

## 2014-06-29 ENCOUNTER — Ambulatory Visit (INDEPENDENT_AMBULATORY_CARE_PROVIDER_SITE_OTHER): Payer: Medicare Other | Admitting: Internal Medicine

## 2014-06-29 ENCOUNTER — Encounter: Payer: Self-pay | Admitting: Internal Medicine

## 2014-06-29 VITALS — BP 120/80 | HR 80 | Temp 98.7°F | Resp 20 | Ht 68.0 in | Wt 188.0 lb

## 2014-06-29 DIAGNOSIS — C719 Malignant neoplasm of brain, unspecified: Secondary | ICD-10-CM

## 2014-06-29 DIAGNOSIS — Z0000 Encounter for general adult medical examination without abnormal findings: Secondary | ICD-10-CM | POA: Diagnosis not present

## 2014-06-29 DIAGNOSIS — I10 Essential (primary) hypertension: Secondary | ICD-10-CM

## 2014-06-29 LAB — LIPID PANEL
Cholesterol: 224 mg/dL — ABNORMAL HIGH (ref 0–200)
HDL: 39.7 mg/dL (ref >39.00)
NONHDL: 184.3
TRIGLYCERIDES: 251 mg/dL — AB (ref 0.0–149.0)
Total CHOL/HDL Ratio: 6
VLDL: 50.2 mg/dL — ABNORMAL HIGH (ref 0.0–40.0)

## 2014-06-29 LAB — LDL CHOLESTEROL, DIRECT: LDL DIRECT: 120 mg/dL

## 2014-06-29 LAB — TSH: TSH: 1.24 u[IU]/mL (ref 0.35–4.50)

## 2014-06-29 NOTE — Progress Notes (Signed)
Subjective:    Patient ID: [name], male    DOB: 1960-04-10, 54 y.o.   MRN: [id number]  HPI   Subjective:    Patient ID: [name], male    DOB: Aug 03, 1960, 54 y.o.   MRN: [id number]  HPI  54 year old patient who is seen today for a health maintenance examination. He is followed closely by oncology do to GBM and he is also followed at [facility] for  therapy every 28 days.  Past Medical History  Diagnosis Date  . ALLERGIC RHINITIS 01/09/2009  . HYPERTENSION 01/09/2009  . SEIZURE DISORDER 01/09/2009  . Glioblastoma multiforme     stage IV  . Thyroid disease     Hypothyroidism    History   Social History  . Marital Status: Married    Spouse Name: N/A  . Number of Children: N/A  . Years of Education: N/A   Occupational History  . Not on file.   Social History Main Topics  . Smoking status: Never Smoker   . Smokeless tobacco: Never Used  . Alcohol Use: No  . Drug Use: No  . Sexual Activity: Not on file   Other Topics Concern  . Not on file   Social History Narrative    Past Surgical History  Procedure Laterality Date  . Craniotomy  2009    glioblastoma    No family history on file.  Allergies  Allergen Reactions  . Pollen Extract Other (See Comments)    Other reaction: sneezing, stuffy nose  . Penicillins Rash    Current Outpatient Prescriptions on File Prior to Visit  Medication Sig Dispense Refill  . levETIRAcetam (KEPPRA) 500 MG tablet TAKE ONE TABLET BY MOUTH TWICE A DAY 60 tablet 2  . levothyroxine (SYNTHROID, LEVOTHROID) 75 MCG tablet take 1 tablet by mouth once daily 90 tablet 0  . Potassium (GNP POTASSIUM) 99 MG TABS Take by mouth.    . sertraline (ZOLOFT) 25 MG tablet take 1 tablet by mouth once daily 90 tablet 0  . vitamin B-12 (CYANOCOBALAMIN) 1000 MCG tablet Take 500 mcg by mouth daily.     . [DISCONTINUED] pantoprazole (PROTONIX) 40 MG tablet Take 40 mg by mouth daily.      No current facility-administered medications  on file prior to visit.    BP 120/80 mmHg  Pulse 80  Temp(Src) 98.7 F (37.1 C) (Oral)  Resp 20  Ht 5\' 8"  (1.727 m)  Wt 188 lb (85.276 kg)  BMI 28.59 kg/m2  SpO2 96%   Past medical history patient had a colonoscopy at age 63 which was normal Social history married 2 children son age 56 daughter age 42; nonsmoker  Family history father died of complications of MDS at age 1 mother age 50 and is in reasonably good health  1. Risk factors, based on past  M,S,F history-  cardiovascular risk factors include a history of hypertension  2.  Physical activities: Limited do to a right-sided hemiparesis  3.  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  Hearing: No deficits  5.  ADL's: Fairly independent in aspects of daily living. Able to drive a car. Right-sided weakness  6.  Fall risk: Moderate due to right-sided hemiparesis and right homonymous hemianopsia  7.  Home safety: No problems identified  8.  Height weight, and visual acuity; height and weight stable 9.  Counseling: Close oncology followup recommended.  Heart healthy diet more regular exercise encouraged  10. Lab orders based  on risk factors: Will check a lipid profile and TSH.  Other laboratory studies were followed frequently by oncology  11. Referral : Colonoscopy age 17 (67 years after his original)  52. Care plan: Follow up oncology and at [facility]  13. Cognitive assessment: Alert and appropriate normal affect mildly impaired speech  14.  Preventive services will include annual health assessment with screening lab.  He will be continued on 10 year colonoscopy intervals and will be considered for follow-up next year.  Annual eye examinations.  Encouraged  15.  Provider list includes ophthalmology primary care and oncology as well as radiology     Review of Systems  Constitutional: Negative for fever, chills, appetite change and fatigue.  HENT:  Negative for hearing loss, ear pain, congestion, sore throat, trouble swallowing, neck stiffness, dental problem, voice change and tinnitus.   Eyes: Negative for pain, discharge and visual disturbance (right homonymous hemianopsia).  Respiratory: Negative for cough, chest tightness, wheezing and stridor.   Cardiovascular: Negative for chest pain, palpitations and leg swelling.  Gastrointestinal: Negative for nausea, vomiting, abdominal pain, diarrhea, constipation, blood in stool and abdominal distention.  Genitourinary: Negative for urgency, hematuria, flank pain, discharge, difficulty urinating and genital sores.  Musculoskeletal: Negative for myalgias, back pain, joint swelling, arthralgias and gait problem.  Skin: Negative for rash.  Neurological: Negative for dizziness, syncope, speech difficulty, weakness ( right spastic hemiparesthesias), numbness and headaches.  Hematological: Negative for adenopathy. Does not bruise/bleed easily.  Psychiatric/Behavioral: Negative for behavioral problems and dysphoric mood. The patient is not nervous/anxious.        Objective:   Physical Exam  Constitutional: He appears well-developed and well-nourished.  HENT:  Head: Normocephalic and atraumatic.  Right Ear: External ear normal.  Left Ear: External ear normal.  Nose: Nose normal.  Mouth/Throat: Oropharynx is clear and moist.  Eyes: Conjunctivae normal and EOM are normal. Pupils are equal, round, and reactive to light. No scleral icterus.       Right homonymous hemianopsia  Neck: Normal range of motion. Neck supple. No JVD present. No thyromegaly present.  Cardiovascular: Regular rhythm, normal heart sounds and intact distal pulses.  Exam reveals no gallop and no friction rub.   No murmur heard. Pulmonary/Chest: Effort normal and breath sounds normal. He exhibits no tenderness.  Abdominal: Soft. Bowel sounds are normal. He exhibits no distension and no mass. There is no tenderness.    Genitourinary: Prostate normal and penis normal. Guaiac negative stool.  Musculoskeletal: Normal range of motion. He exhibits no edema and no tenderness.  Lymphadenopathy:    He has no cervical adenopathy.  Neurological: He is alert. He displays abnormal reflex. No cranial nerve deficit. Coordination normal.       Spastic right hemiparesis  Right-sided hyperreflexia with clonus of the right ankle  Skin: Skin is warm and dry. No rash noted.  Psychiatric: He has a normal mood and affect. His behavior is normal.          Assessment & Plan:   Preventive health examination Glioblastoma multiforme-  follow up oncology History of hypertension.  This has required treatment in the past.  Remains normotensive Hypothyroidism.  We'll check a TSH    Review of Systems    as above Objective:   Physical Exam   As above     Assessment & Plan:    As above

## 2014-06-29 NOTE — Progress Notes (Signed)
Pre visit review using our clinic review tool, if applicable. No additional management support is needed unless otherwise documented below in the visit note. 

## 2014-06-29 NOTE — Patient Instructions (Signed)

## 2014-06-30 ENCOUNTER — Encounter: Payer: Self-pay | Admitting: Internal Medicine

## 2014-07-07 ENCOUNTER — Other Ambulatory Visit: Payer: Self-pay | Admitting: *Deleted

## 2014-07-07 DIAGNOSIS — C719 Malignant neoplasm of brain, unspecified: Secondary | ICD-10-CM

## 2014-07-07 MED ORDER — LEVETIRACETAM 500 MG PO TABS: ORAL_TABLET | ORAL | Status: DC

## 2014-07-07 NOTE — Telephone Encounter (Signed)
Received refill request via fax for [place]. Refilled per Dr. Benay Spice. Sent electronically.

## 2014-07-18 ENCOUNTER — Other Ambulatory Visit: Payer: Self-pay | Admitting: Internal Medicine

## 2014-08-08 ENCOUNTER — Telehealth: Payer: Self-pay | Admitting: Oncology

## 2014-08-08 NOTE — Telephone Encounter (Signed)
Lft msg for pt confirming MD visit moved up 1 hour due to provider schedule, mailed out schedule... KJ

## 2014-08-14 ENCOUNTER — Other Ambulatory Visit: Payer: Self-pay | Admitting: Internal Medicine

## 2014-10-10 ENCOUNTER — Encounter: Payer: Self-pay | Admitting: Internal Medicine

## 2014-10-10 ENCOUNTER — Ambulatory Visit (INDEPENDENT_AMBULATORY_CARE_PROVIDER_SITE_OTHER): Payer: Medicare Other | Admitting: Internal Medicine

## 2014-10-10 VITALS — BP 130/80 | HR 78 | Temp 98.2°F | Resp 20 | Ht 68.0 in | Wt 189.0 lb

## 2014-10-10 DIAGNOSIS — H6122 Impacted cerumen, left ear: Secondary | ICD-10-CM

## 2014-10-10 DIAGNOSIS — I10 Essential (primary) hypertension: Secondary | ICD-10-CM

## 2014-10-10 NOTE — Progress Notes (Signed)
Subjective:    Patient ID: [name], male    DOB: 1960/11/22, 54 y.o.   MRN: [id number]  HPI  54 year old patient who presents with a chief complaint of diminished hearing from the left ear.  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  He is followed closely at [facility] and will be reevaluated tomorrow  Past Medical History  Diagnosis Date  . ALLERGIC RHINITIS 01/09/2009  . HYPERTENSION 01/09/2009  . SEIZURE DISORDER 01/09/2009  . Glioblastoma multiforme     stage IV  . Thyroid disease     Hypothyroidism    Social History   Social History  . Marital Status: Married    Spouse Name: N/A  . Number of Children: N/A  . Years of Education: N/A   Occupational History  . Not on file.   Social History Main Topics  . Smoking status: Never Smoker   . Smokeless tobacco: Never Used  . Alcohol Use: No  . Drug Use: No  . Sexual Activity: Not on file   Other Topics Concern  . Not on file   Social History Narrative    Past Surgical History  Procedure Laterality Date  . Craniotomy  2009    glioblastoma    No family history on file.  Allergies  Allergen Reactions  . Pollen Extract Other (See Comments)    Other reaction: sneezing, stuffy nose  . Penicillins Rash    Current Outpatient Prescriptions on File Prior to Visit  Medication Sig Dispense Refill  . levETIRAcetam (KEPPRA) 500 MG tablet TAKE ONE TABLET BY MOUTH TWICE A DAY 60 tablet 2  . levothyroxine (SYNTHROID, LEVOTHROID) 75 MCG tablet take 1 tablet by mouth once daily 90 tablet 0  . Potassium (GNP POTASSIUM) 99 MG TABS Take by mouth.    . sertraline (ZOLOFT) 25 MG tablet take 1 tablet by mouth once daily . NEEDS PHYSICAL. 90 tablet 1  . vitamin B-12 (CYANOCOBALAMIN) 1000 MCG tablet Take 500 mcg by mouth daily.     . [DISCONTINUED] pantoprazole (PROTONIX) 40 MG tablet Take 40 mg by mouth daily.      No current facility-administered  medications on file prior to visit.    BP 130/80 mmHg  Pulse 78  Temp(Src) 98.2 F (36.8 C) (Oral)  Resp 20  Ht 5\' 8"  (1.727 m)  Wt 189 lb (85.73 kg)  BMI 28.74 kg/m2  SpO2 98%      Review of Systems  Constitutional: Negative for fever, chills, appetite change and fatigue.  HENT: Positive for hearing loss. Negative for congestion, dental problem, ear pain, sore throat, tinnitus, trouble swallowing and voice change.   Eyes: Negative for pain, discharge and visual disturbance.  Respiratory: Negative for cough, chest tightness, wheezing and stridor.   Cardiovascular: Negative for chest pain, palpitations and leg swelling.  Gastrointestinal: Negative for nausea, vomiting, abdominal pain, diarrhea, constipation, blood in stool and abdominal distention.  Genitourinary: Negative for urgency, hematuria, flank pain, discharge, difficulty urinating and genital sores.  Musculoskeletal: Negative for myalgias, back pain, joint swelling, arthralgias, gait problem and neck stiffness.  Skin: Negative for rash.  Neurological: Negative for dizziness, syncope, speech difficulty, weakness, numbness and headaches.  Hematological: Negative for adenopathy. Does not bruise/bleed easily.  Psychiatric/Behavioral: Negative for behavioral problems and dysphoric mood. The patient is not nervous/anxious.        Objective:   Physical Exam  Constitutional: He is oriented to person, place, and time. He  appears well-developed.  Blood pressure 130/80  HENT:  Head: Normocephalic.  Right Ear: External ear normal.  Left Ear: External ear normal.  Cerumen.  Right canal  Eyes: Conjunctivae and EOM are normal.  Neck: Normal range of motion.  Cardiovascular: Normal rate and normal heart sounds.   Pulmonary/Chest: Breath sounds normal.  Abdominal: Bowel sounds are normal.  Musculoskeletal: Normal range of motion. He exhibits no edema or tenderness.  Neurological: He is alert and oriented to person, place, and  time.  Alert with normal speech with right hemiparesis  Psychiatric: He has a normal mood and affect. His behavior is normal.          Assessment & Plan:   Cerumen impaction, left ear.  Canal irrigated until clear Hypertension, well-controlled

## 2014-10-10 NOTE — Patient Instructions (Signed)
Cerumen Impaction °A cerumen impaction is when the wax in your ear forms a plug. This plug usually causes reduced hearing. Sometimes it also causes an earache or dizziness. Removing a cerumen impaction can be difficult and painful. The wax sticks to the ear canal. The canal is sensitive and bleeds easily. If you try to remove a heavy wax buildup with a cotton tipped swab, you may push it in further. °Irrigation with water, suction, and small ear curettes may be used to clear out the wax. If the impaction is fixed to the skin in the ear canal, ear drops may be needed for a few days to loosen the wax. People who build up a lot of wax frequently can use ear wax removal products available in your local drugstore. °SEEK MEDICAL CARE IF:  °You develop an earache, increased hearing loss, or marked dizziness. °Document Released: 03/14/2004 Document Revised: 04/29/2011 Document Reviewed: 05/04/2009 °ExitCare® Patient Information ©2015 [employer]. This information is not intended to replace advice given to you by your health care provider. Make sure you discuss any questions you have with your health care provider. ° °

## 2014-10-10 NOTE — Progress Notes (Signed)
Pre visit review using our clinic review tool, if applicable. No additional management support is needed unless otherwise documented below in the visit note. 

## 2014-10-17 ENCOUNTER — Telehealth: Payer: Self-pay | Admitting: Internal Medicine

## 2014-10-17 NOTE — Telephone Encounter (Signed)
Please see message and advise 

## 2014-10-17 NOTE — Telephone Encounter (Signed)
Pt said he is having a little pain in his left ear and thinks it may be infected and is asking for a antibiotic.    Pharmacy [employer]

## 2014-10-17 NOTE — Telephone Encounter (Signed)
Generic Cortisporin otic 2 drops in affected ear 4 times daily

## 2014-10-18 MED ORDER — NEOMYCIN-POLYMYXIN-HC 3.5-10000-1 OT SOLN: 2.0000 [drp] | Freq: Four times a day (QID) | OTIC | Status: DC

## 2014-10-18 NOTE — Telephone Encounter (Signed)
Pt notified Rx for ear drops for left ear sent to pharmacy, if no improvement please call office. Pt verbalized understanding.

## 2014-11-09 ENCOUNTER — Other Ambulatory Visit: Payer: Self-pay | Admitting: Internal Medicine

## 2014-12-01 ENCOUNTER — Ambulatory Visit: Payer: Medicare Other | Admitting: Oncology

## 2014-12-02 ENCOUNTER — Telehealth: Payer: Self-pay | Admitting: Oncology

## 2014-12-02 ENCOUNTER — Other Ambulatory Visit: Payer: Self-pay | Admitting: *Deleted

## 2014-12-02 NOTE — Telephone Encounter (Signed)
s.w. pt and r/s missed appt.....pt ok and aware °

## 2015-01-04 ENCOUNTER — Encounter: Payer: Self-pay | Admitting: Adult Health

## 2015-01-04 ENCOUNTER — Ambulatory Visit (INDEPENDENT_AMBULATORY_CARE_PROVIDER_SITE_OTHER): Payer: Medicare Other | Admitting: Adult Health

## 2015-01-04 VITALS — BP 118/80 | Temp 99.1°F | Ht 68.0 in | Wt 187.4 lb

## 2015-01-04 DIAGNOSIS — J020 Streptococcal pharyngitis: Secondary | ICD-10-CM

## 2015-01-04 LAB — POCT RAPID STREP A (OFFICE): RAPID STREP A SCREEN: NEGATIVE

## 2015-01-04 MED ORDER — AZITHROMYCIN 250 MG PO TABS: ORAL_TABLET | ORAL | Status: DC

## 2015-01-04 NOTE — Patient Instructions (Signed)

## 2015-01-04 NOTE — Progress Notes (Signed)
  [name] is a 54 y.o. male presenting with a sore throat for 4 days.  Associated symptoms include:  fever, chills, ear fullness, muscle aches and diarrhea, sore throat and the feeling of inability to swallow.  Symptoms are progressively worsening.  Home treatment thus far includes:  rest and hydration.  No known sick contacts with similar symptoms.  Positive history of similar symptoms.  Exam:  BP 118/80 mmHg  Temp(Src) 99.1 F (37.3 C) (Oral)  Ht 5\' 8"  (1.727 m)  Wt 187 lb 6.4 oz (85.004 kg)  BMI 28.50 kg/m2 Constitutional: Well appearing, well nourished and in NAD HEENT: TM's visualized, no acute infection. Tonsils slightly enlarged and red. No exudate seen  Neck: Extremely swollen and tender lymph nodes R>L Heart: Normal rate, normal rhythm, no murmur, gallop, or rub Lungs: Clear to auscultation Skin: Warm and dry.    1. Strep sore throat - Rapid strep- negative - Will send culture due to appearance and symptoms - azithromycin (ZITHROMAX) 250 MG tablet; Two pills today and one pill for the next 4 days  Dispense: 6 each; Refill: 0 - warm salt water gargles and or honey to help soothe the throat.

## 2015-01-04 NOTE — Progress Notes (Signed)
Pre visit review using our clinic review tool, if applicable. No additional management support is needed unless otherwise documented below in the visit note. 

## 2015-01-05 ENCOUNTER — Ambulatory Visit (HOSPITAL_BASED_OUTPATIENT_CLINIC_OR_DEPARTMENT_OTHER): Payer: Medicare Other | Admitting: Oncology

## 2015-01-05 ENCOUNTER — Telehealth: Payer: Self-pay | Admitting: Oncology

## 2015-01-05 VITALS — BP 117/86 | HR 87 | Temp 97.7°F | Resp 18 | Ht 68.0 in | Wt 187.1 lb

## 2015-01-05 DIAGNOSIS — C719 Malignant neoplasm of brain, unspecified: Secondary | ICD-10-CM

## 2015-01-05 DIAGNOSIS — Z85841 Personal history of malignant neoplasm of brain: Secondary | ICD-10-CM | POA: Diagnosis present

## 2015-01-05 NOTE — Progress Notes (Signed)
[facility] OFFICE PROGRESS NOTE   Diagnosis: Glioblastoma  INTERVAL HISTORY:   [name] returns as scheduled. He continues monthly vaccine therapy at [facility]. No new neurologic symptoms. No seizures. He reports several episodes of worsened expressive a fascia over the past few years. He continues close follow-up with [name].  He saw [name] 01/02/2015. In MRI revealed no evidence of disease progression.  He developed a sore throat earlier this week and was seen at primary care yesterday. A rapid strep test was negative. He was prescribed azithromycin. He feels better.  Objective:  Vital signs in last 24 hours:  Blood pressure 147/86, pulse 87, temperature 97.7 F (36.5 C), temperature source Oral, resp. rate 18, height 5' 8"  (1.727 m), weight 187 lb 1.6 oz (84.868 kg), SpO2 97 %.    HEENT: Pharynx without erythema or exudate Resp: Lungs clear bilaterally Cardio: Regular rate and rhythm GI: No hepatosplenomegaly, nontender Vascular: No leg edema Neuro: Alert, the motor exam is intact in the left arm and leg. 3/5 strength in the right hand. 3/5 strength with dorsi flexion at the right foot. Limited abduction at the right shoulder.     Medications: I have reviewed the patient's current medications.  Assessment/Plan: 1. 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 An MRI of the brain during a hospital admission on 03/27/2010 was compared to an MRI from [facility] on 02/22/2010. There was an increase in the abnormal signal of the left posterior opercular region and periatrial region, concerning for progression of tumor. Review of the MRI by [name] at [facility] confirmed disease progression. Avastin was resumed on 05/28/2010. Hydroxyurea was initiated on 06/07/2010. The hydroxyurea was placed on hold on 06/27/2010 due to leukopenia and thrombocytopenia.  1. Initiation of salvage therapy with temozolomide and the Celldex vaccine beginning on 08/16/2010.  2. MRI of  the brain at [facility] on 10/31/2010 is concerning for disease progression with an interval increase in enhancement surrounding the resection cavity and inferior margin of the putamen and left optic tract. 3. MRI of the brain at [facility] on 11/28/2010 showed a slight interval increase in peripheral nodular enhancement surrounding the resection cavity 4. He underwent a repeat MRI of the brain at [facility] on 12/26/2010-this showed a stable appearance of the left temporal lobe resection cavity and marginal enhancement. 5. Restaging MRI of the brain 12/26/2010 at [facility] with a slight interval decrease in the size of residual left temporal lobe tumor 6. Restaging MRI of the brain at [facility] on 02/20/2011 revealed a slight interval decrease in the residual left temporal lobe tumor 7. Restaging MRI of the brain at [facility] on 04/03/2011 revealed stable disease. 8. MRI 06/12/2011 at [facility] with stable left temporal resection cavity with decreased peripheral enhancement 9. He completed a final cycle of temozolomide beginning on 06/27/11 and continues monthly vaccine therapy 10. PET scan on may 29th 2013 at [facility] definite foci of hypermetabolic FDG activity identified, in the region of the irregular nodular MRI enhancement in the inferior left temporal lobe there are no definite foci of hypermetabolic activity, there was low level FDG activity in this location similar to the surrounding nonenhancing left temporal lobe 11. Restaging MRI of the brain at [facility] on 11/06/2011-stable left brain resection site and? New less than 1 cm left brain lesion, no enhancement on a PET scan 12. Stable radiographic disease on an MRI 12/31/2011 13. Continued treatment with the Celldex vaccine with an MRI of the brain at [facility] on 09/09/2012 revealed overall stable disease aside  from a slight increase in a nodular focus of enhancement the left anterior temporal lobe with central necrosis 14. MRI 12/02/2012 with increased enhancement in the anterior left  temporal area 15. MRI guided left temporal resection 01/07/2013-negative for tumor 16. Continuation of treatment on the Celldex vaccine, treatment #32 on 01/27/2013 17. Restaging MRI at [facility] 03/08/2014 with no evidence of disease progression 18. Dose #46 of the Celldex vaccine on 03/08/2014 19. MRI at [facility] 01/02/2015 without evidence of disease progression, dose #56 Celldex vaccine 2. 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 3. 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 Increase in the Keppra dose 09/09/2012 secondary to the possibility of visual seizures, now maintained on low-dose Keppra 5. Hypothyroidism, maintained on Synthroid. 6. History of Hypertension, essential hypertension and an effect of Avastin. Improved 7. History of nephrotic syndrome secondary to Avastin. 8. History of progressive thrombocytopenia, status post a bone marrow biopsy on 04/26/2010 with findings of a hypocellular marrow with decreased megakaryocytes and a left shift in the myeloid and erythroid series and minimal dyspoiesis. Cytogenetics returned normal. He again developed thrombocytopenia after receiving Avastin and beginning hydroxyurea in April 2012. The platelet count has improved since the Avastin and hydroxyurea were discontinued.  9. Vitamin B12 deficiency. 10. Acute diverticulitis in April 2012, improved with ciprofloxacin and Flagyl. Related to Avastin (?).  11. Adhesive capsulitis of the right shoulder, followed by [name]. He is no longer taking pain medication and the range of motion has improved after a physical therapy program.  12. 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 Low serum testosterone level on labs requested by [name] in November 2012-he is not taking testosterone   14.  Subdural hemorrhage after a fall requiring surgical evacuation in May 2015  15. Fall outside of his car 03/30/2014 with scalp abrasion/ecchymoses   Disposition:  He remains in clinical remission from the glioblastoma. He will continue monthly Celldex vaccination and close clinical follow-up at [facility]. I encouraged him to obtain an influenza vaccine and have the thyroid function checked. He plans to schedule an appointment with [name].  [name] will return for an office visit here in 9 months. I am available to see him in the interim as needed.  Bruce Coder, MD  01/05/2015  12:59 PM

## 2015-01-05 NOTE — Telephone Encounter (Signed)
Gave and printed aptp sched and avs for pt for July 2017

## 2015-01-06 LAB — CULTURE, GROUP A STREP: Organism ID, Bacteria: NORMAL

## 2015-01-10 ENCOUNTER — Other Ambulatory Visit: Payer: Self-pay | Admitting: *Deleted

## 2015-01-10 ENCOUNTER — Other Ambulatory Visit: Payer: Self-pay | Admitting: Internal Medicine

## 2015-01-10 DIAGNOSIS — C719 Malignant neoplasm of brain, unspecified: Secondary | ICD-10-CM

## 2015-01-10 MED ORDER — LEVETIRACETAM 500 MG PO TABS: ORAL_TABLET | ORAL | Status: DC

## 2015-04-28 ENCOUNTER — Encounter: Payer: Self-pay | Admitting: Internal Medicine

## 2015-04-28 ENCOUNTER — Ambulatory Visit (INDEPENDENT_AMBULATORY_CARE_PROVIDER_SITE_OTHER): Payer: Medicare Other | Admitting: Internal Medicine

## 2015-04-28 VITALS — BP 124/80 | HR 85 | Temp 98.7°F | Resp 20 | Ht 68.0 in | Wt 191.0 lb

## 2015-04-28 DIAGNOSIS — D649 Anemia, unspecified: Secondary | ICD-10-CM

## 2015-04-28 DIAGNOSIS — R202 Paresthesia of skin: Secondary | ICD-10-CM

## 2015-04-28 DIAGNOSIS — J309 Allergic rhinitis, unspecified: Secondary | ICD-10-CM | POA: Diagnosis not present

## 2015-04-28 DIAGNOSIS — E538 Deficiency of other specified B group vitamins: Secondary | ICD-10-CM

## 2015-04-28 DIAGNOSIS — I10 Essential (primary) hypertension: Secondary | ICD-10-CM

## 2015-04-28 DIAGNOSIS — E039 Hypothyroidism, unspecified: Secondary | ICD-10-CM | POA: Diagnosis not present

## 2015-04-28 DIAGNOSIS — C719 Malignant neoplasm of brain, unspecified: Secondary | ICD-10-CM | POA: Diagnosis not present

## 2015-04-28 NOTE — Progress Notes (Signed)
Pre visit review using our clinic review tool, if applicable. No additional management support is needed unless otherwise documented below in the visit note. 

## 2015-04-28 NOTE — Progress Notes (Signed)
Subjective:    Patient ID: [name], male    DOB: Feb 12, 1961, 55 y.o.   MRN: [id number]  HPI  55 year old patient who has a history of GBM and is followed closely at [facility] and also locally by oncology.  He has essential hypertension, allergic rhinitis and hypothyroidism.  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 His doctors at [facility] have requested a number of screening tests including folate and B12 levels as well as an updated thyroid panel. Patient is doing quite well.  Does have occasional paresthesias involving his left leg.  This seems to be positional.  While sleeping  Past Medical History  Diagnosis Date  . ALLERGIC RHINITIS 01/09/2009  . HYPERTENSION 01/09/2009  . SEIZURE DISORDER 01/09/2009  . Glioblastoma multiforme ([place])     stage IV  . Thyroid disease     Hypothyroidism    Social History   Social History  . Marital Status: Married    Spouse Name: N/A  . Number of Children: N/A  . Years of Education: N/A   Occupational History  . Not on file.   Social History Main Topics  . Smoking status: Never Smoker   . Smokeless tobacco: Never Used  . Alcohol Use: No  . Drug Use: No  . Sexual Activity: Not on file   Other Topics Concern  . Not on file   Social History Narrative    Past Surgical History  Procedure Laterality Date  . Craniotomy  2009    glioblastoma    No family history on file.  Allergies  Allergen Reactions  . Pollen Extract Other (See Comments)    Other reaction: sneezing, stuffy nose  . Penicillins Rash    Current Outpatient Prescriptions on File Prior to Visit  Medication Sig Dispense Refill  . levETIRAcetam (KEPPRA) 500 MG tablet TAKE ONE TABLET BY MOUTH TWICE A DAY (Patient taking differently: Take 250 mg by mouth 2 (two) times daily. ) 60 tablet 2  . levothyroxine (SYNTHROID, LEVOTHROID) 75 MCG tablet take 1 tablet by mouth once daily 90 tablet 1  . Potassium (GNP POTASSIUM) 99 MG TABS Take by mouth.    . sertraline  (ZOLOFT) 25 MG tablet take 1 tablet by mouth once daily 90 tablet 1  . vitamin B-12 (CYANOCOBALAMIN) 1000 MCG tablet Take 500 mcg by mouth daily.     . [DISCONTINUED] pantoprazole (PROTONIX) 40 MG tablet Take 40 mg by mouth daily.      No current facility-administered medications on file prior to visit.    BP 124/80 mmHg  Pulse 85  Temp(Src) 98.7 F (37.1 C) (Oral)  Resp 20  Ht 5\' 8"  (1.727 m)  Wt 191 lb (86.637 kg)  BMI 29.05 kg/m2  SpO2 96%     Review of Systems  Constitutional: Negative for fever, chills, appetite change and fatigue.  HENT: Negative for congestion, dental problem, ear pain, hearing loss, sore throat, tinnitus, trouble swallowing and voice change.   Eyes: Negative for pain, discharge and visual disturbance.  Respiratory: Negative for cough, chest tightness, wheezing and stridor.   Cardiovascular: Negative for chest pain, palpitations and leg swelling.  Gastrointestinal: Negative for nausea, vomiting, abdominal pain, diarrhea, constipation, blood in stool and abdominal distention.  Genitourinary: Negative for urgency, hematuria, flank pain, discharge, difficulty urinating and genital sores.  Musculoskeletal: Positive for gait problem. Negative for myalgias, back pain, joint swelling, arthralgias and neck stiffness.  Skin: Negative for rash.  Neurological: Positive for weakness and numbness. Negative for dizziness, syncope,  speech difficulty and headaches.  Hematological: Negative for adenopathy. Does not bruise/bleed easily.  Psychiatric/Behavioral: Negative for behavioral problems and dysphoric mood. The patient is not nervous/anxious.        Objective:   Physical Exam  Constitutional: He is oriented to person, place, and time. He appears well-developed.  Blood pressure 124/80  HENT:  Head: Normocephalic.  Right Ear: External ear normal.  Left Ear: External ear normal.  Eyes: Conjunctivae and EOM are normal.  Neck: Normal range of motion.    Cardiovascular: Normal rate and normal heart sounds.   Pulmonary/Chest: Breath sounds normal.  Abdominal: Bowel sounds are normal.  Musculoskeletal: Normal range of motion. He exhibits no edema or tenderness.  Neurological: He is alert and oriented to person, place, and time.  Right spastic hemiparesis  Psychiatric: He has a normal mood and affect. His behavior is normal.          Assessment & Plan:   Hypertension, well-controlled Glioblastoma multiform.  Stable Hypothyroidism.  We'll check thyroid function studies Seizures order stable   Follow-up 6 months or as needed

## 2015-04-28 NOTE — Patient Instructions (Signed)
Return in 6 months for follow-up  Please check your blood pressure on a regular basis.  If it is consistently greater than 150/90, please make an office appointment.

## 2015-04-29 LAB — CBC WITH DIFFERENTIAL/PLATELET
Basophils Absolute: 0 10*3/uL (ref 0.0–0.1)
Basophils Relative: 0 % (ref 0–1)
EOS ABS: 0.1 10*3/uL (ref 0.0–0.7)
EOS PCT: 2 % (ref 0–5)
HCT: 40.4 % (ref 39.0–52.0)
Hemoglobin: 13.9 g/dL (ref 13.0–17.0)
LYMPHS ABS: 1.7 10*3/uL (ref 0.7–4.0)
Lymphocytes Relative: 39 % (ref 12–46)
MCH: 33.3 pg (ref 26.0–34.0)
MCHC: 34.4 g/dL (ref 30.0–36.0)
MCV: 96.9 fL (ref 78.0–100.0)
MONO ABS: 0.4 10*3/uL (ref 0.1–1.0)
MONOS PCT: 9 % (ref 3–12)
MPV: 9.1 fL (ref 8.6–12.4)
NEUTROS PCT: 50 % (ref 43–77)
Neutro Abs: 2.2 10*3/uL (ref 1.7–7.7)
Platelets: 176 10*3/uL (ref 150–400)
RBC: 4.17 MIL/uL — ABNORMAL LOW (ref 4.22–5.81)
RDW: 14.1 % (ref 11.5–15.5)
WBC: 4.3 10*3/uL (ref 4.0–10.5)

## 2015-04-29 LAB — T4, FREE: FREE T4: 1.3 ng/dL (ref 0.8–1.8)

## 2015-04-29 LAB — TSH: TSH: 1.18 m[IU]/L (ref 0.40–4.50)

## 2015-04-29 LAB — HIV ANTIBODY (ROUTINE TESTING W REFLEX): HIV 1&2 Ab, 4th Generation: NONREACTIVE

## 2015-04-29 LAB — HEPATITIS C ANTIBODY: HCV AB: NEGATIVE

## 2015-05-04 ENCOUNTER — Telehealth: Payer: Self-pay | Admitting: Internal Medicine

## 2015-05-04 NOTE — Telephone Encounter (Signed)
Called about lab results would like a call back

## 2015-05-05 NOTE — Telephone Encounter (Signed)
Spoke to pt, told him I found the other lab results B12 & Folate were both normal. Pt verbalized understanding.

## 2015-05-05 NOTE — Telephone Encounter (Signed)
Spoke to pt, told him lab results were normal except RBC slightly low, nothing worrisome and B12 and Folate result is not back yet but I have the lab checking on it. I will you back Monday with the result. Pt verbalized understanding.

## 2015-05-11 ENCOUNTER — Other Ambulatory Visit: Payer: Self-pay | Admitting: General Practice

## 2015-05-11 MED ORDER — LEVOTHYROXINE SODIUM 75 MCG PO TABS: 75.0000 ug | ORAL_TABLET | Freq: Every day | ORAL | Status: DC

## 2015-07-12 ENCOUNTER — Other Ambulatory Visit: Payer: Self-pay

## 2015-07-12 NOTE — Telephone Encounter (Signed)
Pt is requesting a refill. Last filled on 01/10/15, last OV 04/28/15. Okay to refill?

## 2015-07-14 MED ORDER — SERTRALINE HCL 25 MG PO TABS: 25.0000 mg | ORAL_TABLET | Freq: Every day | ORAL | Status: DC

## 2015-07-14 NOTE — Telephone Encounter (Signed)
Sent to pts pharmacy 

## 2015-07-14 NOTE — Telephone Encounter (Signed)
Okay to refill? 

## 2015-07-31 ENCOUNTER — Encounter: Payer: Self-pay | Admitting: Family Medicine

## 2015-07-31 ENCOUNTER — Ambulatory Visit (INDEPENDENT_AMBULATORY_CARE_PROVIDER_SITE_OTHER): Payer: Medicare Other | Admitting: Family Medicine

## 2015-07-31 VITALS — BP 110/84 | HR 82 | Temp 99.2°F | Ht 68.0 in | Wt 190.5 lb

## 2015-07-31 DIAGNOSIS — J029 Acute pharyngitis, unspecified: Secondary | ICD-10-CM

## 2015-07-31 DIAGNOSIS — J069 Acute upper respiratory infection, unspecified: Secondary | ICD-10-CM | POA: Diagnosis not present

## 2015-07-31 LAB — POCT RAPID STREP A (OFFICE): Rapid Strep A Screen: NEGATIVE

## 2015-07-31 MED ORDER — BENZONATATE 100 MG PO CAPS: 100.0000 mg | ORAL_CAPSULE | Freq: Three times a day (TID) | ORAL | Status: DC | PRN

## 2015-07-31 MED ORDER — AZITHROMYCIN 250 MG PO TABS
ORAL_TABLET | ORAL | Status: DC
Start: 2015-07-31 — End: 2015-09-04

## 2015-07-31 NOTE — Patient Instructions (Signed)
BEFORE YOU LEAVE: -rapid and strep culture -zpack prescription  We have ordered labs or studies at this visit. It can take up to 1-2 weeks for results and processing. IF results require follow up or explanation, we will call you with instructions. Clinically stable results will be released to your [facility]. If you have not heard from [place] or cannot find your results in [facility] in 2 weeks please contact our office at [phone].  If you are not yet signed up for [facility], please consider signing up.  INSTRUCTIONS FOR UPPER RESPIRATORY INFECTION:  -nasal saline wash 2-3 times daily (use prepackaged nasal saline or bottled/distilled water if making your own)   -can use AFRIN nasal spray for drainage and nasal congestion - but do NOT use longer then 3-4 days  -can use tylenol (in no history of liver disease) or ibuprofen (if no history of kidney disease, bowel bleeding or significant heart disease) as directed for aches and sorethroat  -if you are taking a cough medication - use only as directed, may also try a teaspoon of honey to coat the throat and throat lozenges.   -for sore throat, salt water gargles can help  -take the antibiotic and follow up if you have fevers that are persistent or worsening, facial pain, tooth pain, difficulty breathing or are worsening or symptoms persist longer then expected  Upper Respiratory Infection, Adult An upper respiratory infection (URI) is also known as the common cold. It is often caused by a type of germ (virus). Colds are easily spread (contagious). You can pass it to others by kissing, coughing, sneezing, or drinking out of the same glass. Usually, you get better in 1 to 3  weeks.  However, the cough can last for even longer. HOME CARE   Only take medicine as told by your doctor. Follow instructions provided above.  Drink enough water and fluids to keep your pee (urine) clear or pale yellow.  Get plenty of rest.  Return to work when your  temperature is < 100 for 24 hours or as told by your doctor. You may use a face mask and wash your hands to stop your cold from spreading. GET HELP RIGHT AWAY IF:   After the first few days, you feel you are getting worse.  You have questions about your medicine.  You have chills, shortness of breath, or red spit (mucus).  You have pain in the face for more then 1-2 days, especially when you bend forward.  You have a fever, puffy (swollen) neck, pain when you swallow, or white spots in the back of your throat.  You have a bad headache, ear pain, sinus pain, or chest pain.  You have a high-pitched whistling sound when you breathe in and out (wheezing).  You cough up blood.  You have sore muscles or a stiff neck. MAKE SURE YOU:   Understand these instructions.  Will watch your condition.  Will get help right away if you are not doing well or get worse. Document Released: 07/24/2007 Document Revised: 04/29/2011 Document Reviewed: 05/12/2013 [facility] Patient Information 2015 [place], [place]. This information is not intended to replace advice given to you by your health care provider. Make sure you discuss any questions you have with your health care provider.

## 2015-07-31 NOTE — Progress Notes (Signed)
HPI:  Acute visit for URI: -started: yesterday -symptoms:nasal congestion, sore throat, cough, low grade temp -denies:fever, SOB, NVD, tooth pain -has tried: nothing -sick contacts/travel/risks: daughter with treated strep last week, no mentioned tick bites -Hx of: allergies, reports chronic but improving low WBCs with hx cancer and reports usually takes a zpack if gets sick - his WBCs were normal on his last labs done here a few months ago on review of chart  ROS: See pertinent positives and negatives per HPI.  Past Medical History  Diagnosis Date  . ALLERGIC RHINITIS 01/09/2009  . HYPERTENSION 01/09/2009  . SEIZURE DISORDER 01/09/2009  . Glioblastoma multiforme ([place])     stage IV  . Thyroid disease     Hypothyroidism    Past Surgical History  Procedure Laterality Date  . Craniotomy  2009    glioblastoma    No family history on file.  Social History   Social History  . Marital Status: Married    Spouse Name: N/A  . Number of Children: N/A  . Years of Education: N/A   Social History Main Topics  . Smoking status: Never Smoker   . Smokeless tobacco: Never Used  . Alcohol Use: No  . Drug Use: No  . Sexual Activity: Not Asked   Other Topics Concern  . None   Social History Narrative     Current outpatient prescriptions:  .  levETIRAcetam (KEPPRA) 500 MG tablet, TAKE ONE TABLET BY MOUTH TWICE A DAY (Patient taking differently: Take 250 mg by mouth 2 (two) times daily. ), Disp: 60 tablet, Rfl: 2 .  levothyroxine (SYNTHROID, LEVOTHROID) 75 MCG tablet, Take 1 tablet (75 mcg total) by mouth daily., Disp: 90 tablet, Rfl: 1 .  Potassium (GNP POTASSIUM) 99 MG TABS, Take by mouth., Disp: , Rfl:  .  sertraline (ZOLOFT) 25 MG tablet, Take 1 tablet (25 mg total) by mouth daily., Disp: 90 tablet, Rfl: 0 .  valACYclovir (VALTREX) 1000 MG tablet, Take 1,000 mg by mouth as needed. , Disp: , Rfl:  .  vitamin B-12 (CYANOCOBALAMIN) 1000 MCG tablet, Take 500 mcg by mouth  daily. , Disp: , Rfl:  .  azithromycin (ZITHROMAX) 250 MG tablet, 2 tabs day 1, then one tab daily, Disp: 6 tablet, Rfl: 0 .  benzonatate (TESSALON PERLES) 100 MG capsule, Take 1 capsule (100 mg total) by mouth 3 (three) times daily as needed for cough., Disp: 20 capsule, Rfl: 0 .  [DISCONTINUED] pantoprazole (PROTONIX) 40 MG tablet, Take 40 mg by mouth daily. , Disp: , Rfl:   EXAM:  Filed Vitals:   07/31/15 1447  BP: 110/84  Pulse: 82  Temp: 99.2 F (37.3 C)    Body mass index is 28.97 kg/(m^2).  GENERAL: vitals reviewed and listed above, alert, oriented, appears well hydrated and in no acute distress  HEENT: atraumatic, conjunttiva clear, no obvious abnormalities on inspection of external nose and ears, normal appearance of ear canals and TMs, clear nasal congestion, mild post oropharyngeal erythema with PND, no tonsillar edema or exudate, no sinus TTP  NECK: no obvious masses on inspection  LUNGS: clear to auscultation bilaterally, no wheezes, rales or rhonchi, good air movement  CV: HRRR, no peripheral edema  MS: moves all extremities without noticeable abnormality  PSYCH: pleasant and cooperative, no obvious depression or anxiety  ASSESSMENT AND PLAN:  Discussed the following assessment and plan:  Acute upper respiratory infection  Sore throat  -given HPI and exam findings today, a serious infection or illness  is unlikely. We discussed potential etiologies, with VURI being most likely, and advised supportive care and monitoring. Will check for strep given his concern. Abx printed to use if worsening or not improving as expected given ? immunosuppression. We discussed treatment side effects, likely course, antibiotic misuse, transmission, and signs of developing a serious illness. -of course, we advised to return or notify a doctor immediately if symptoms worsen or persist or new concerns arise.    Patient Instructions  BEFORE YOU LEAVE: -rapid and strep  culture -zpack prescription  We have ordered labs or studies at this visit. It can take up to 1-2 weeks for results and processing. IF results require follow up or explanation, we will call you with instructions. Clinically stable results will be released to your [facility]. If you have not heard from [place] or cannot find your results in [facility] in 2 weeks please contact our office at [phone].  If you are not yet signed up for [facility], please consider signing up.  INSTRUCTIONS FOR UPPER RESPIRATORY INFECTION:  -nasal saline wash 2-3 times daily (use prepackaged nasal saline or bottled/distilled water if making your own)   -can use AFRIN nasal spray for drainage and nasal congestion - but do NOT use longer then 3-4 days  -can use tylenol (in no history of liver disease) or ibuprofen (if no history of kidney disease, bowel bleeding or significant heart disease) as directed for aches and sorethroat  -if you are taking a cough medication - use only as directed, may also try a teaspoon of honey to coat the throat and throat lozenges.   -for sore throat, salt water gargles can help  -take the antibiotic and follow up if you have fevers that are persistent or worsening, facial pain, tooth pain, difficulty breathing or are worsening or symptoms persist longer then expected  Upper Respiratory Infection, Adult An upper respiratory infection (URI) is also known as the common cold. It is often caused by a type of germ (virus). Colds are easily spread (contagious). You can pass it to others by kissing, coughing, sneezing, or drinking out of the same glass. Usually, you get better in 1 to 3  weeks.  However, the cough can last for even longer. HOME CARE   Only take medicine as told by your doctor. Follow instructions provided above.  Drink enough water and fluids to keep your pee (urine) clear or pale yellow.  Get plenty of rest.  Return to work when your temperature is < 100 for 24 hours or as told  by your doctor. You may use a face mask and wash your hands to stop your cold from spreading. GET HELP RIGHT AWAY IF:   After the first few days, you feel you are getting worse.  You have questions about your medicine.  You have chills, shortness of breath, or red spit (mucus).  You have pain in the face for more then 1-2 days, especially when you bend forward.  You have a fever, puffy (swollen) neck, pain when you swallow, or white spots in the back of your throat.  You have a bad headache, ear pain, sinus pain, or chest pain.  You have a high-pitched whistling sound when you breathe in and out (wheezing).  You cough up blood.  You have sore muscles or a stiff neck. MAKE SURE YOU:   Understand these instructions.  Will watch your condition.  Will get help right away if you are not doing well or get worse. Document Released: 07/24/2007  Document Revised: 04/29/2011 Document Reviewed: 05/12/2013 [facility] Patient Information 2015 [place], [place]. This information is not intended to replace advice given to you by your health care provider. Make sure you discuss any questions you have with your health care provider.           Colin Benton R.

## 2015-07-31 NOTE — Progress Notes (Signed)
Pre visit review using our clinic review tool, if applicable. No additional management support is needed unless otherwise documented below in the visit note. 

## 2015-07-31 NOTE — Addendum Note (Signed)
Addended by: Agnes Lawrence on: 07/31/2015 03:25 PM   Modules accepted: Orders

## 2015-08-02 LAB — CULTURE, GROUP A STREP: Organism ID, Bacteria: NORMAL

## 2015-08-03 ENCOUNTER — Telehealth: Payer: Self-pay | Admitting: Internal Medicine

## 2015-08-03 NOTE — Telephone Encounter (Signed)
Pt call to say he need a call back about his culture

## 2015-08-04 NOTE — Telephone Encounter (Signed)
Pt is returning [name] call °

## 2015-08-04 NOTE — Telephone Encounter (Signed)
Spoke to pt, told him Strep culture was negative.. Pt verbalized understanding.

## 2015-08-11 ENCOUNTER — Ambulatory Visit (INDEPENDENT_AMBULATORY_CARE_PROVIDER_SITE_OTHER): Payer: Medicare Other | Admitting: Internal Medicine

## 2015-08-11 ENCOUNTER — Encounter: Payer: Self-pay | Admitting: Internal Medicine

## 2015-08-11 VITALS — BP 120/90 | HR 89 | Temp 98.2°F | Resp 20 | Ht 68.0 in | Wt 186.0 lb

## 2015-08-11 DIAGNOSIS — I10 Essential (primary) hypertension: Secondary | ICD-10-CM

## 2015-08-11 DIAGNOSIS — B9789 Other viral agents as the cause of diseases classified elsewhere: Secondary | ICD-10-CM

## 2015-08-11 DIAGNOSIS — J069 Acute upper respiratory infection, unspecified: Secondary | ICD-10-CM

## 2015-08-11 DIAGNOSIS — J3089 Other allergic rhinitis: Secondary | ICD-10-CM

## 2015-08-11 MED ORDER — HYDROCODONE-HOMATROPINE 5-1.5 MG/5ML PO SYRP: 5.0000 mL | ORAL_SOLUTION | Freq: Four times a day (QID) | ORAL | Status: DC | PRN

## 2015-08-11 NOTE — Progress Notes (Signed)
Pre visit review using our clinic review tool, if applicable. No additional management support is needed unless otherwise documented below in the visit note. 

## 2015-08-11 NOTE — Progress Notes (Signed)
Subjective:    Patient ID: [name], male    DOB: 1960/09/06, 55 y.o.   MRN: [id number]  HPI  55 year old patient who is seen with a two-week history of a URI.  He was seen about 10 days ago after an acute illness of 2 days' duration.  He presented with cough, congestion, and especially of sinus congestion.  Initially had fever and sore throat.  Rapid strep screen was negative.  He began expectorating green sputum and he is now completing azithromycin therapy. Fever has not reoccurred.  He generally feels unwell.  Past Medical History  Diagnosis Date  . ALLERGIC RHINITIS 01/09/2009  . HYPERTENSION 01/09/2009  . SEIZURE DISORDER 01/09/2009  . 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 Thyroid disease     Hypothyroidism     Social History   Social History  . Marital Status: Married    Spouse Name: N/A  . Number of Children: N/A  . Years of Education: N/A   Occupational History  . Not on file.   Social History Main Topics  . Smoking status: Never Smoker   . Smokeless tobacco: Never Used  . Alcohol Use: No  . Drug Use: No  . Sexual Activity: Not on file   Other Topics Concern  . Not on file   Social History Narrative    Past Surgical History  Procedure Laterality Date  . Craniotomy  2009    glioblastoma    No family history on file.  Allergies  Allergen Reactions  . Pollen Extract Other (See Comments)    Other reaction: sneezing, stuffy nose  . Penicillins Rash    Current Outpatient Prescriptions on File Prior to Visit  Medication Sig Dispense Refill  . azithromycin (ZITHROMAX) 250 MG tablet 2 tabs day 1, then one tab daily 6 tablet 0  . levETIRAcetam (KEPPRA) 500 MG tablet TAKE ONE TABLET BY MOUTH TWICE A DAY (Patient taking differently: Take 250 mg by mouth 2 (two) times daily. ) 60 tablet 2  . levothyroxine (SYNTHROID, LEVOTHROID) 75 MCG tablet Take 1 tablet (75 mcg total) by mouth daily. 90 tablet 1  . Potassium (GNP POTASSIUM) 99 MG TABS  Take by mouth.    . sertraline (ZOLOFT) 25 MG tablet Take 1 tablet (25 mg total) by mouth daily. 90 tablet 0  . valACYclovir (VALTREX) 1000 MG tablet Take 1,000 mg by mouth as needed.     . vitamin B-12 (CYANOCOBALAMIN) 1000 MCG tablet Take 500 mcg by mouth daily.     . benzonatate (TESSALON PERLES) 100 MG capsule Take 1 capsule (100 mg total) by mouth 3 (three) times daily as needed for cough. (Patient not taking: Reported on 08/11/2015) 20 capsule 0  . [DISCONTINUED] pantoprazole (PROTONIX) 40 MG tablet Take 40 mg by mouth daily.      No current facility-administered medications on file prior to visit.    BP 120/90 mmHg  Pulse 89  Temp(Src) 98.2 F (36.8 C) (Oral)  Resp 20  Ht 5\' 8"  (1.727 m)  Wt 186 lb (84.369 kg)  BMI 28.29 kg/m2  SpO2 97%     Review of Systems  Constitutional: Positive for fever and fatigue. Negative for chills and appetite change.  HENT: Positive for congestion, postnasal drip and sinus pressure. Negative for dental problem, ear pain, hearing loss, sore throat, tinnitus, trouble swallowing and voice change.   Eyes: Negative for pain, discharge and visual disturbance.  Respiratory: Positive for cough. Negative for chest tightness,  wheezing and stridor.   Cardiovascular: Negative for chest pain, palpitations and leg swelling.  Gastrointestinal: Negative for nausea, vomiting, abdominal pain, diarrhea, constipation, blood in stool and abdominal distention.  Genitourinary: Negative for urgency, hematuria, flank pain, discharge, difficulty urinating and genital sores.  Musculoskeletal: Negative for myalgias, back pain, joint swelling, arthralgias, gait problem and neck stiffness.  Skin: Negative for rash.  Neurological: Negative for dizziness, syncope, speech difficulty, weakness, numbness and headaches.  Hematological: Negative for adenopathy. Does not bruise/bleed easily.  Psychiatric/Behavioral: Negative for behavioral problems and dysphoric mood. The patient is  not nervous/anxious.        Objective:   Physical Exam  Constitutional: He is oriented to person, place, and time. He appears well-developed.  HENT:  Head: Normocephalic.  Right Ear: External ear normal.  Left Ear: External ear normal.  Eyes: Conjunctivae and EOM are normal.  Neck: Normal range of motion.  Cardiovascular: Normal rate and normal heart sounds.   Pulmonary/Chest:  Few scattered rhonchi right lung fields  Abdominal: Bowel sounds are normal.  Musculoskeletal: Normal range of motion. He exhibits no edema or tenderness.  Neurological: He is alert and oriented to person, place, and time.  Psychiatric: He has a normal mood and affect. His behavior is normal.          Assessment & Plan:   URI.  Patient still has cough and considerable sinus congestion.  Will intensify saline irrigation and add a decongestant.  Will complete antibiotic therapy Essential hypertension, stable   Nyoka Cowden, MD

## 2015-08-11 NOTE — Patient Instructions (Signed)
Take over-the-counter expectorants and cough medications such as  Mucinex DM.  Call if there is no improvement in 5 to 7 days or if  you develop worsening cough, fever, or new symptoms, such as shortness of breath or chest pain.  HOME CARE INSTRUCTIONS  Drink plenty of water. Water helps thin the mucus so your sinuses can drain more easily.  Use a humidifier.  Inhale steam 3-4 times a day (for example, sit in the bathroom with the shower running).  Apply a warm, moist washcloth to your face 3-4 times a day, or as directed by your health care provider.  Use saline nasal sprays to help moisten and clean your sinuses.  Take medicines only as directed by your health care provider.

## 2015-09-04 ENCOUNTER — Ambulatory Visit (HOSPITAL_BASED_OUTPATIENT_CLINIC_OR_DEPARTMENT_OTHER): Payer: Medicare Other | Admitting: Oncology

## 2015-09-04 ENCOUNTER — Telehealth: Payer: Self-pay | Admitting: Oncology

## 2015-09-04 VITALS — BP 111/70 | HR 79 | Temp 98.4°F | Resp 18 | Ht 68.0 in | Wt 186.8 lb

## 2015-09-04 DIAGNOSIS — C719 Malignant neoplasm of brain, unspecified: Secondary | ICD-10-CM | POA: Diagnosis present

## 2015-09-04 DIAGNOSIS — R10811 Right upper quadrant abdominal tenderness: Secondary | ICD-10-CM

## 2015-09-04 NOTE — Telephone Encounter (Signed)
Gave pt cal & avs °

## 2015-09-04 NOTE — Progress Notes (Signed)
[place] OFFICE PROGRESS NOTE   Diagnosis: Glioblastoma Multiforme  INTERVAL HISTORY:   Mr. Labrada returns as scheduled. He continues the [employer] vaccine program at [facility]. He was last treated on 08/15/2015. An MRI on 06/19/2015 revealed no evidence of disease progression. He feels well. He reports developing increased expressive aphasia a few months ago. This improved when the Keppra dose was increased. No other new neurologic symptoms. Stable right visual field deficit and stable right sided weakness.   Objective:  Vital signs in last 24 hours:  Blood pressure 111/70, pulse 79, temperature 98.4 F (36.9 C), temperature source Oral, resp. rate 18, height _0  (1.727 m), weight 186 lb 12.8 oz (84.732 kg), SpO2 94 %.   Resp: Lungs clear bilaterally Cardio: Regular rate and rhythm GI: No hepatosplenomegaly, mild tenderness in the right upper abdomen, no mass Vascular: No leg edema Neuro: Alert, the speech is fluent, weakness of the right arm/hand/leg and foot. He is ambulatory.    Medications: I have reviewed the patient's current medications.  Assessment/Plan: 1. 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 An MRI of the brain during a hospital admission on 03/27/2010 was compared to an MRI from [place] on 02/22/2010. There was an increase in the abnormal signal of the left posterior opercular region and periatrial region, concerning for progression of tumor. Review of the MRI by Dr. Ferdinand Lango at [facility] confirmed disease progression. Avastin was resumed on 05/28/2010. Hydroxyurea was initiated on 06/07/2010. The hydroxyurea was placed on hold on 06/27/2010 due to leukopenia and thrombocytopenia.  1. Initiation of salvage therapy with temozolomide and the Celldex vaccine beginning on 08/16/2010.  2. MRI of the brain at [facility] on 10/31/2010 is concerning for disease progression with an interval increase in enhancement surrounding the resection cavity and inferior margin  of the putamen and left optic tract. 3. MRI of the brain at [facility] on 11/28/2010 showed a slight interval increase in peripheral nodular enhancement surrounding the resection cavity 4. He underwent a repeat MRI of the brain at [facility] on 12/26/2010-this showed a stable appearance of the left temporal lobe resection cavity and marginal enhancement. 5. Restaging MRI of the brain 12/26/2010 at [facility] with a slight interval decrease in the size of residual left temporal lobe tumor 6. Restaging MRI of the brain at [facility] on 02/20/2011 revealed a slight interval decrease in the residual left temporal lobe tumor 7. Restaging MRI of the brain at [facility] on 04/03/2011 revealed stable disease. 8. MRI 06/12/2011 at [facility] with stable left temporal resection cavity with decreased peripheral enhancement 9. He completed a final cycle of temozolomide beginning on 06/27/11 and continues monthly vaccine therapy 10. PET scan on may 29th 2013 at [facility] definite foci of hypermetabolic FDG activity identified, in the region of the irregular nodular MRI enhancement in the inferior left temporal lobe there are no definite foci of hypermetabolic activity, there was low level FDG activity in this location similar to the surrounding nonenhancing left temporal lobe 11. Restaging MRI of the brain at [facility] on 11/06/2011-stable left brain resection site and? New less than 1 cm left brain lesion, no enhancement on a PET scan 12. Stable radiographic disease on an MRI 12/31/2011 13. Continued treatment with the Celldex vaccine with an MRI of the brain at [facility] on 09/09/2012 revealed overall stable disease aside from a slight increase in a nodular focus of enhancement the left anterior temporal lobe with central necrosis 14. MRI 12/02/2012 with increased enhancement in the anterior left temporal area 15. MRI guided  left temporal resection 01/07/2013-negative for tumor 16. Continuation of treatment on the Celldex vaccine, treatment #32 on  01/27/2013 17. Restaging MRI at [facility] 03/08/2014 with no evidence of disease progression 18. Dose #46 of the Celldex vaccine on 03/08/2014 19. MRI at [facility] 01/02/2015 without evidence of disease progression, dose #56 Celldex vaccine 20. MRI at [facility] 06/19/2015-stable,Celldex # 64 given 08/15/2015 2. 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 3. 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 Increase in the Keppra dose 09/09/2012 secondary to the possibility of visual seizures, Keppra dose increased 2017 secondary to episodes of aphasia-improved 5. Hypothyroidism, maintained on Synthroid. 6. History of Hypertension, essential hypertension and an effect of Avastin. Improved 7. History of nephrotic syndrome secondary to Avastin. 8. History of progressive thrombocytopenia, status post a bone marrow biopsy on 04/26/2010 with findings of a hypocellular marrow with decreased megakaryocytes and a left shift in the myeloid and erythroid series and minimal dyspoiesis. Cytogenetics returned normal. He again developed thrombocytopenia after receiving Avastin and beginning hydroxyurea in April 2012. The platelet count has improved since the Avastin and hydroxyurea were discontinued.  9. Vitamin B12 deficiency. 10. Acute diverticulitis in April 2012, improved with ciprofloxacin and Flagyl. Related to Avastin (?).  11. Adhesive capsulitis of the right shoulder, followed by Dr. Amedeo Plenty. He is no longer taking pain medication and the range of motion has improved after a physical therapy program.  12. 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 Low serum testosterone level on labs requested by Dr. Ferdinand Lango in November 2012-he is not taking testosterone   14. Subdural hemorrhage after a fall requiring surgical evacuation  in May 2015  15. Fall outside of his car 03/30/2014 with scalp abrasion/ecchymoses   Disposition:  Mr. Parisi is stable. He continues close follow-up with Dr. Ferdinand Lango at [facility]. He will return for an office visit here in 9 months. We are available to see him in the interim as needed. He had mild tenderness in the right abdomen on exam today. He will seek medical attention for abdominal pain.  Betsy Coder, MD  09/04/2015  12:33 PM

## 2015-10-10 ENCOUNTER — Other Ambulatory Visit: Payer: Self-pay | Admitting: Internal Medicine

## 2015-10-11 NOTE — Telephone Encounter (Signed)
Rx refill sent to pharmacy. 

## 2015-11-06 ENCOUNTER — Other Ambulatory Visit: Payer: Self-pay | Admitting: Internal Medicine

## 2016-01-08 ENCOUNTER — Other Ambulatory Visit: Payer: Self-pay | Admitting: Internal Medicine

## 2016-04-08 ENCOUNTER — Telehealth: Payer: Self-pay | Admitting: Internal Medicine

## 2016-04-08 DIAGNOSIS — D229 Melanocytic nevi, unspecified: Secondary | ICD-10-CM

## 2016-04-08 NOTE — Telephone Encounter (Signed)
See message below, please advise.

## 2016-04-08 NOTE — Telephone Encounter (Signed)
° ° °  Pt call to request a referral to a dermatologist for moles.   Dr Lavonna Monarch

## 2016-04-08 NOTE — Telephone Encounter (Signed)
Okay for dermatology referral

## 2016-04-08 NOTE — Telephone Encounter (Signed)
Spoke to pt and made him aware that a dermatology referral order was placed. Pt verbalized understanding.

## 2016-04-18 ENCOUNTER — Encounter: Payer: Self-pay | Admitting: Internal Medicine

## 2016-04-18 ENCOUNTER — Ambulatory Visit (INDEPENDENT_AMBULATORY_CARE_PROVIDER_SITE_OTHER): Payer: Medicare Other | Admitting: Internal Medicine

## 2016-04-18 VITALS — BP 138/72 | HR 93 | Temp 98.2°F | Ht 68.0 in | Wt 189.2 lb

## 2016-04-18 DIAGNOSIS — I10 Essential (primary) hypertension: Secondary | ICD-10-CM | POA: Diagnosis not present

## 2016-04-18 DIAGNOSIS — J3089 Other allergic rhinitis: Secondary | ICD-10-CM | POA: Diagnosis not present

## 2016-04-18 DIAGNOSIS — E039 Hypothyroidism, unspecified: Secondary | ICD-10-CM

## 2016-04-18 NOTE — Progress Notes (Signed)
Subjective:    Patient ID: [name], male    DOB: 01-19-1961, 56 y.o.   MRN: [id number]  HPI  56 year old patient who has a history of GBM and is followed closely at [facility] oncology. For the past 2 or 3 weeks.  He had increasing sinus congestion.  He he feels that his left ear is clogged.  He also describes some popping sensation with movement of the head and neck.  He has a prior history of intracranial bleeding and he was concerned about this possibility.  Denies any fever or purulent drainage.  Denies any dental pain.  Past Medical History:  Diagnosis Date  . ALLERGIC RHINITIS 01/09/2009  . Glioblastoma multiforme ([place])    stage IV  . HYPERTENSION 01/09/2009  . SEIZURE DISORDER 01/09/2009  . Thyroid disease    Hypothyroidism     Social History   Social History  . Marital status: Married    Spouse name: N/A  . Number of children: N/A  . Years of education: N/A   Occupational History  . Not on file.   Social History Main Topics  . Smoking status: Never Smoker  . Smokeless tobacco: Never Used  . Alcohol use No  . Drug use: No  . Sexual activity: Not on file   Other Topics Concern  . Not on file   Social History Narrative  . No narrative on file    Past Surgical History:  Procedure Laterality Date  . CRANIOTOMY  2009   glioblastoma    No family history on file.  Allergies  Allergen Reactions  . Pollen Extract Other (See Comments)    Other reaction: sneezing, stuffy nose  . Penicillins Rash    Current Outpatient Prescriptions on File Prior to Visit  Medication Sig Dispense Refill  . HYDROcodone-homatropine (HYCODAN) 5-1.5 MG/5ML syrup Take 5 mLs by mouth every 6 (six) hours as needed for cough. 120 mL 0  . levETIRAcetam (KEPPRA) 500 MG tablet Take 500 mg by mouth 2 (two) times daily.    Marland Kitchen levothyroxine (SYNTHROID, LEVOTHROID) 75 MCG tablet take 1 tablet by mouth once daily 90 tablet 1  . loratadine (CLARITIN) 10 MG tablet Take 10 mg by mouth as  needed.    . Potassium (GNP POTASSIUM) 99 MG TABS Take by mouth.    . sertraline (ZOLOFT) 25 MG tablet take 1 tablet by mouth once daily 90 tablet 1  . valACYclovir (VALTREX) 1000 MG tablet Take 1,000 mg by mouth as needed.     . vitamin B-12 (CYANOCOBALAMIN) 1000 MCG tablet Take 500 mcg by mouth daily.     . [DISCONTINUED] pantoprazole (PROTONIX) 40 MG tablet Take 40 mg by mouth daily.      No current facility-administered medications on file prior to visit.     BP 138/72 (BP Location: Left Arm, Patient Position: Sitting, Cuff Size: Normal)   Pulse 93   Temp 98.2 F (36.8 C) (Oral)   Ht 5\' 8"  (1.727 m)   Wt 189 lb 3.2 oz (85.8 kg)   SpO2 97%   BMI 28.77 kg/m     Review of Systems  Constitutional: Negative for appetite change, chills, fatigue and fever.  HENT: Positive for congestion and sinus pressure. Negative for dental problem, ear pain, hearing loss, sore throat, tinnitus, trouble swallowing and voice change.   Eyes: Negative for pain, discharge and visual disturbance.  Respiratory: Negative for cough, chest tightness, wheezing and stridor.   Cardiovascular: Negative for chest pain, palpitations and leg  swelling.  Gastrointestinal: Negative for abdominal distention, abdominal pain, blood in stool, constipation, diarrhea, nausea and vomiting.  Genitourinary: Negative for difficulty urinating, discharge, flank pain, genital sores, hematuria and urgency.  Musculoskeletal: Positive for gait problem. Negative for arthralgias, back pain, joint swelling, myalgias and neck stiffness.  Skin: Negative for rash.  Neurological: Negative for dizziness, syncope, speech difficulty, weakness, numbness and headaches.       Right hemiparesis, arm greater than leg  Hematological: Negative for adenopathy. Does not bruise/bleed easily.  Psychiatric/Behavioral: Negative for behavioral problems and dysphoric mood. The patient is not nervous/anxious.        Objective:   Physical Exam    Constitutional: He is oriented to person, place, and time. He appears well-developed.  HENT:  Head: Normocephalic.  Right Ear: External ear normal.  Left Ear: External ear normal.  Both tympanic membranes appear normal.  Canals clear Weber doesn't lateralize to the left  Eyes: Conjunctivae and EOM are normal.  Neck: Normal range of motion.  Cardiovascular: Normal rate and normal heart sounds.   Pulmonary/Chest: Breath sounds normal.  Abdominal: Bowel sounds are normal.  Musculoskeletal: Normal range of motion. He exhibits no edema or tenderness.  Neurological: He is alert and oriented to person, place, and time.  Right hemiparesis, arm greater than leg Right HH  Psychiatric: He has a normal mood and affect. His behavior is normal.          Assessment & Plan:   Allergic rhinitis/serous otitis.  Will treat with the decongestants.  Hydration saline irrigation.  Patient reassured History of glioblastoma multiform.  Starr here 6 months  Nyoka Cowden

## 2016-04-18 NOTE — Progress Notes (Signed)
Pre visit review using our clinic review tool, if applicable. No additional management support is needed unless otherwise documented below in the visit note. 

## 2016-04-18 NOTE — Patient Instructions (Addendum)
Use saline irrigation, warm  moist compresses and over-the-counter decongestants only as directed.  Call if there is no improvement in 5 to 7 days, or sooner if you develop increasing pain, fever, or any new symptoms.  Follow these instructions at home: Medicines   Take, use, or apply over-the-counter and prescription medicines only as told by your health care provider. These may include nasal sprays.   Hydrate and Humidify   Drink enough water to keep your urine clear or pale yellow. Staying hydrated will help to thin your mucus.  Use a cool mist humidifier to keep the humidity level in your home above 50%.  Inhale steam for 10-15 minutes, 3-4 times a day or as told by your health care provider. You can do this in the bathroom while a hot shower is running.  Limit your exposure to cool or dry air.

## 2016-05-01 ENCOUNTER — Other Ambulatory Visit: Payer: Self-pay | Admitting: Internal Medicine

## 2016-05-29 ENCOUNTER — Encounter: Payer: Self-pay | Admitting: Family Medicine

## 2016-05-29 ENCOUNTER — Ambulatory Visit (INDEPENDENT_AMBULATORY_CARE_PROVIDER_SITE_OTHER): Payer: Medicare Other | Admitting: Family Medicine

## 2016-05-29 VITALS — BP 120/88 | HR 80 | Temp 98.8°F | Wt 191.7 lb

## 2016-05-29 DIAGNOSIS — J320 Chronic maxillary sinusitis: Secondary | ICD-10-CM

## 2016-05-29 MED ORDER — DOXYCYCLINE HYCLATE 100 MG PO CAPS: 100.0000 mg | ORAL_CAPSULE | Freq: Two times a day (BID) | ORAL | 0 refills | Status: DC

## 2016-05-29 NOTE — Patient Instructions (Signed)

## 2016-05-29 NOTE — Progress Notes (Signed)
Pre visit review using our clinic review tool, if applicable. No additional management support is needed unless otherwise documented below in the visit note. 

## 2016-05-29 NOTE — Progress Notes (Signed)
Subjective:     Patient ID: [name], male   DOB: Apr 22, 1960, 56 y.o.   MRN: [id number]  HPI Patient seen with about a week history of left maxillary sinus pain and pressure. He had similar symptoms back around the first of March. He treated conservatively with saline irrigation and hydration and feels like symptoms have never fully resolved. Does have some left ear pain. Occasional headache. No bloody nasal discharge. No fevers or chills. He has history of glioblastoma multiforme with surgery about 8 years ago. He is followed very closely at [facility].  Past Medical History:  Diagnosis Date  . ALLERGIC RHINITIS 01/09/2009  . Glioblastoma multiforme ([place])    stage IV  . HYPERTENSION 01/09/2009  . SEIZURE DISORDER 01/09/2009  . Thyroid disease    Hypothyroidism   Past Surgical History:  Procedure Laterality Date  . CRANIOTOMY  2009   glioblastoma    reports that he has never smoked. He has never used smokeless tobacco. He reports that he does not drink alcohol or use drugs. family history is not on file. Allergies  Allergen Reactions  . Pollen Extract Other (See Comments)    Other reaction: sneezing, stuffy nose  . Penicillins Rash     Review of Systems  Constitutional: Positive for fatigue. Negative for chills and fever.  HENT: Positive for congestion, ear pain and sinus pressure. Negative for ear discharge.   Respiratory: Negative for cough.   Neurological: Negative for headaches.       Objective:   Physical Exam  Constitutional: He appears well-developed and well-nourished.  HENT:  Right Ear: External ear normal.  Left Ear: External ear normal.  Mouth/Throat: Oropharynx is clear and moist.  Tenderness over the left maxillary sinus region. No visible swelling. Nasal mucosa erythematous bilaterally  Cardiovascular: Normal rate and regular rhythm.   Pulmonary/Chest: Effort normal and breath sounds normal. No respiratory distress. He has no wheezes. He has no  rales.       Assessment:     Persistent left maxillary facial pain. Rule out acute/chronic sinusitis. Patient penicillin allergic    Plan:     -Doxycycline 100 mg twice daily for 10 days -Stay well-hydrated -Continue nasal saline irrigation (with distilled/non-tap water). -Follow-up with primary if not improved following the above  Eulas Post MD [facility]

## 2016-06-04 ENCOUNTER — Ambulatory Visit (HOSPITAL_BASED_OUTPATIENT_CLINIC_OR_DEPARTMENT_OTHER): Payer: Medicare Other | Admitting: Oncology

## 2016-06-04 ENCOUNTER — Telehealth: Payer: Self-pay | Admitting: Oncology

## 2016-06-04 VITALS — BP 122/76 | HR 85 | Temp 98.5°F | Resp 18 | Ht 68.0 in | Wt 192.2 lb

## 2016-06-04 DIAGNOSIS — C719 Malignant neoplasm of brain, unspecified: Secondary | ICD-10-CM

## 2016-06-04 NOTE — Progress Notes (Signed)
[place] OFFICE PROGRESS NOTE   Diagnosis: Glioblastoma  INTERVAL HISTORY:   [name] returns as scheduled. He feels well. He reports intermittent "fluttering "in the central vision, but this is not occurred for the past month. No other new neurologic symptoms. He continues monthly vaccine therapy at [facility]. MRI [facility] on 05/21/2016 revealed increased nodular enhancement at the left temporal resection cavity. There was also smooth cortical enhancement at the right parietal lobe favored to represent a small infarct.  He is scheduled for a one-month follow-up MRI.  Objective:  Vital signs in last 24 hours:  Blood pressure 122/76, pulse 85, temperature 98.5 F (36.9 C), temperature source Oral, resp. rate 18, height _0  (1.727 m), weight 192 lb 3.2 oz (87.2 kg), SpO2 97 %.    Resp: Lungs clear bilaterally Cardio: Regular rate and rhythm GI: No hepatosplenomegaly Vascular: No leg edema, left lower leg is slightly larger than the right side Neuro: Alert and oriented, the speech is fluent, there is weakness of the right arm and hand. Mild weakness of the right leg and foot.     Medications: I have reviewed the patient's current medications.  Assessment/Plan: 1. 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 An MRI of the brain during a hospital admission on 03/27/2010 was compared to an MRI from [place] on 02/22/2010. There was an increase in the abnormal signal of the left posterior opercular region and periatrial region, concerning for progression of tumor. Review of the MRI by Dr. Ferdinand Lango at [facility] confirmed disease progression. Avastin was resumed on 05/28/2010. Hydroxyurea was initiated on 06/07/2010. The hydroxyurea was placed on hold on 06/27/2010 due to leukopenia and thrombocytopenia.  1. Initiation of salvage therapy with temozolomide and the Celldex vaccine beginning on 08/16/2010.  2. MRI of the brain at [facility] on 10/31/2010 is concerning for disease  progression with an interval increase in enhancement surrounding the resection cavity and inferior margin of the putamen and left optic tract. 3. MRI of the brain at [facility] on 11/28/2010 showed a slight interval increase in peripheral nodular enhancement surrounding the resection cavity 4. He underwent a repeat MRI of the brain at [facility] on 12/26/2010-this showed a stable appearance of the left temporal lobe resection cavity and marginal enhancement. 5. Restaging MRI of the brain 12/26/2010 at [facility] with a slight interval decrease in the size of residual left temporal lobe tumor 6. Restaging MRI of the brain at [facility] on 02/20/2011 revealed a slight interval decrease in the residual left temporal lobe tumor 7. Restaging MRI of the brain at [facility] on 04/03/2011 revealed stable disease. 8. MRI 06/12/2011 at [facility] with stable left temporal resection cavity with decreased peripheral enhancement 9. He completed a final cycle of temozolomide beginning on 06/27/11 and continues monthly vaccine therapy 10. PET scan on may 29th 2013 at [facility] definite foci of hypermetabolic FDG activity identified, in the region of the irregular nodular MRI enhancement in the inferior left temporal lobe there are no definite foci of hypermetabolic activity, there was low level FDG activity in this location similar to the surrounding nonenhancing left temporal lobe 11. Restaging MRI of the brain at [facility] on 11/06/2011-stable left brain resection site and? New less than 1 cm left brain lesion, no enhancement on a PET scan 12. Stable radiographic disease on an MRI 12/31/2011 13. Continued treatment with the Celldex vaccine with an MRI of the brain at [facility] on 09/09/2012 revealed overall stable disease aside from a slight increase in a nodular focus of enhancement the  left anterior temporal lobe with central necrosis 14. MRI 12/02/2012 with increased enhancement in the anterior left temporal area 15. MRI guided left temporal resection  01/07/2013-negative for tumor 16. Continuation of treatment on the Celldex vaccine, treatment #32 on 01/27/2013 17. Restaging MRI at [facility] 03/08/2014 with no evidence of disease progression 18. Dose #46 of the Celldex vaccine on 03/08/2014 19. MRI at [facility] 01/02/2015 without evidence of disease progression, dose #56 Celldex vaccine 20. MRI at [facility] 06/19/2015-stable,Celldex # 64 given 08/15/2015 21. MRI at [facility] 05/21/2016-increase in size of focal nodular enhancement at the left temporal resection cavity 2. 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 3. 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 Increase in the Keppra dose 09/09/2012 secondary to the possibility of visual seizures, Keppra dose increased 2017 secondary to episodes of aphasia-improved 5. Hypothyroidism, maintained on Synthroid. 6. History of Hypertension, essential hypertension and an effect of Avastin. Improved 7. History of nephrotic syndrome secondary to Avastin. 8. History of progressive thrombocytopenia, status post a bone marrow biopsy on 04/26/2010 with findings of a hypocellular marrow with decreased megakaryocytes and a left shift in the myeloid and erythroid series and minimal dyspoiesis. Cytogenetics returned normal. He again developed thrombocytopenia after receiving Avastin and beginning hydroxyurea in April 2012. The platelet count has improved since the Avastin and hydroxyurea were discontinued.  9. Vitamin B12 deficiency. 10. Acute diverticulitis in April 2012, improved with ciprofloxacin and Flagyl. Related to Avastin (?).  11. Adhesive capsulitis of the right shoulder, followed by Dr. Amedeo Plenty. He is no longer taking pain medication and the range of motion has improved after a physical therapy program.  12. Situational  depression-maintained on Zoloft,  13. Low serum testosterone level on labs requested by Dr. Ferdinand Lango in November 2012-he is not taking testosterone   14. Subdural hemorrhage after a fall requiring surgical evacuation in May 2015  15. Fall outside of his car 03/30/2014 with scalp abrasion/ecchymoses    Disposition:  His overall status appears unchanged. He continues monthly Celldex vaccine treatment at [facility]. A restaging MRI 2 weeks ago revealed possible progression at the resection cavity. He is scheduled for a repeat MRI on 06/18/2016.  He will continue close clinical follow-up at [facility]. I am available to see him as needed. He will return for a scheduled visit here in 9 months.  15 minutes were spent with the patient today. The majority of the time was used for counseling and coordination of care.  Bruce Coder, MD  06/04/2016  12:34 PM

## 2016-06-04 NOTE — Telephone Encounter (Signed)
Gave patient AVS and calender per 4/17 los.  

## 2016-06-18 LAB — HEPATIC FUNCTION PANEL
ALK PHOS: 71 U/L (ref 25–125)
ALT: 28 U/L (ref 10–40)
AST: 28 U/L (ref 14–40)
BILIRUBIN, TOTAL: 0.6 mg/dL

## 2016-06-18 LAB — CBC AND DIFFERENTIAL
HEMATOCRIT: 39 % — AB (ref 41–53)
HEMOGLOBIN: 13 g/dL — AB (ref 13.5–17.5)
Neutrophils Absolute: 2 /uL
PLATELETS: 128 10*3/uL — AB (ref 150–399)
WBC: 4.2 10*3/mL

## 2016-06-18 LAB — BASIC METABOLIC PANEL
BUN: 17 mg/dL (ref 4–21)
Creatinine: 1.1 mg/dL (ref 0.6–1.3)
Glucose: 130 mg/dL
Potassium: 3.9 mmol/L (ref 3.4–5.3)
Sodium: 135 mmol/L — AB (ref 137–147)

## 2016-06-28 ENCOUNTER — Encounter: Payer: Self-pay | Admitting: Family Medicine

## 2016-07-06 ENCOUNTER — Other Ambulatory Visit: Payer: Self-pay | Admitting: Internal Medicine

## 2016-10-30 ENCOUNTER — Other Ambulatory Visit: Payer: Self-pay | Admitting: Internal Medicine

## 2016-11-26 ENCOUNTER — Ambulatory Visit (INDEPENDENT_AMBULATORY_CARE_PROVIDER_SITE_OTHER): Payer: Medicare Other | Admitting: Internal Medicine

## 2016-11-26 ENCOUNTER — Encounter: Payer: Self-pay | Admitting: Internal Medicine

## 2016-11-26 VITALS — BP 118/62 | HR 76 | Temp 98.3°F | Ht 68.0 in | Wt 192.4 lb

## 2016-11-26 DIAGNOSIS — Z23 Encounter for immunization: Secondary | ICD-10-CM

## 2016-11-26 DIAGNOSIS — H9202 Otalgia, left ear: Secondary | ICD-10-CM | POA: Diagnosis not present

## 2016-11-26 MED ORDER — NEOMYCIN-POLYMYXIN-HC 3.5-10000-1 OT SOLN: 3.0000 [drp] | Freq: Four times a day (QID) | OTIC | 0 refills | Status: DC

## 2016-11-26 NOTE — Patient Instructions (Signed)
Call or return to clinic prn if these symptoms worsen or fail to improve as anticipated.

## 2016-11-26 NOTE — Progress Notes (Signed)
Subjective:    Patient ID: [name], male    DOB: 07/25/1960, 56 y.o.   MRN: [id number]  HPI 57 year old patient who has a history of GBM and is status post left craniotomy.  He states that for the past week he has had some fullness involving the left ear area.  Earlier today he awoke with some discomfort in the left ear. He was seen here in follow-up in March and had very similar complaints [place]  Past Medical History:  Diagnosis Date  . ALLERGIC RHINITIS 01/09/2009  . Glioblastoma multiforme ([place])    stage IV  . HYPERTENSION 01/09/2009  . SEIZURE DISORDER 01/09/2009  . Thyroid disease    Hypothyroidism     Social History   Social History  . Marital status: Married    Spouse name: N/A  . Number of children: N/A  . Years of education: N/A   Occupational History  . Not on file.   Social History Main Topics  . Smoking status: Never Smoker  . Smokeless tobacco: Never Used  . Alcohol use No  . Drug use: No  . Sexual activity: Not on file   Other Topics Concern  . Not on file   Social History Narrative  . No narrative on file    Past Surgical History:  Procedure Laterality Date  . CRANIOTOMY  2009   glioblastoma    No family history on file.  Allergies  Allergen Reactions  . Pollen Extract Other (See Comments)    Other reaction: sneezing, stuffy nose  . Penicillins Rash    Current Outpatient Prescriptions on File Prior to Visit  Medication Sig Dispense Refill  . levothyroxine (SYNTHROID, LEVOTHROID) 75 MCG tablet TAKE 1 TABLET BY MOUTH ONCE DAILY 90 tablet 0  . loratadine (CLARITIN) 10 MG tablet Take 10 mg by mouth as needed.    . Potassium (GNP POTASSIUM) 99 MG TABS Take by mouth.    . sertraline (ZOLOFT) 25 MG tablet take 1 tablet by mouth once daily 90 tablet 1  . valACYclovir (VALTREX) 1000 MG tablet Take 1,000 mg by mouth as needed.     . vitamin B-12 (CYANOCOBALAMIN)  1000 MCG tablet Take 500 mcg by mouth daily.     Marland Kitchen doxycycline (VIBRAMYCIN) 100 MG capsule Take 1 capsule (100 mg total) by mouth 2 (two) times daily. (Patient not taking: Reported on 11/26/2016) 20 capsule 0  . levETIRAcetam (KEPPRA) 500 MG tablet Take 500 mg by mouth 2 (two) times daily.    . [DISCONTINUED] pantoprazole (PROTONIX) 40 MG tablet Take 40 mg by mouth daily.      No current facility-administered medications on file prior to visit.     BP 118/62 (BP Location: Left Arm, Patient Position: Sitting, Cuff Size: Normal)   Pulse 76   Temp 98.3 F (36.8 C) (Oral)   Ht 5\' 8"  (1.727 m)   Wt 192 lb 6.4 oz (87.3 kg)   SpO2 98%   BMI 29.25 kg/m  .s    Review of Systems  Constitutional: Negative for appetite change, chills, fatigue and fever.  HENT: Positive for ear pain. Negative for congestion, dental problem, hearing loss, sore throat, tinnitus, trouble swallowing and voice change.   Eyes: Negative for pain, discharge and visual disturbance.  Respiratory: Negative for cough, chest tightness, wheezing and stridor.   Cardiovascular: Negative for chest pain, palpitations and leg swelling.  Gastrointestinal: Negative for abdominal  distention, abdominal pain, blood in stool, constipation, diarrhea, nausea and vomiting.  Genitourinary: Negative for difficulty urinating, discharge, flank pain, genital sores, hematuria and urgency.  Musculoskeletal: Negative for arthralgias, back pain, gait problem, joint swelling, myalgias and neck stiffness.  Skin: Negative for rash.  Neurological: Negative for dizziness, syncope, speech difficulty, weakness, numbness and headaches.  Hematological: Negative for adenopathy. Does not bruise/bleed easily.  Psychiatric/Behavioral: Negative for behavioral problems and dysphoric mood. The patient is not nervous/anxious.        Objective:   Physical Exam  Constitutional: He appears well-developed and well-nourished. No distress.  HENT:  Both ears were  examined with normal tympanic membranes and canals Weber does not lateralize  No obvious hearing loss noted with vibration          Assessment & Plan:   Left otalgia, unclear etiology.  He has had this symptom intermittently in the past.  There has been some outdoor exposure recently, but his ear.  Clinically, appears normal. He was uncomfortable earlier this morning, but presently feels well.  Patient was given a printed prescription for eardrops that he will fill if symptoms reoccur or worsen.  Presently will observe  Nyoka Cowden

## 2017-01-06 ENCOUNTER — Other Ambulatory Visit: Payer: Self-pay | Admitting: Internal Medicine

## 2017-01-13 ENCOUNTER — Ambulatory Visit: Payer: Self-pay | Admitting: *Deleted

## 2017-01-13 NOTE — Telephone Encounter (Signed)
Spoke with Dr Raliegh Ip he would like to see pt tomorrow @ 2:30pm

## 2017-01-13 NOTE — Telephone Encounter (Signed)
   Reason for Disposition . [1] After 72 hours AND [2] chest pain not improving  Answer Assessment - Initial Assessment Questions 1. MECHANISM: "How did the injury happen?"     Getting out of car 2. ONSET: "When did the injury happen?" (Minutes or hours ago)     Wednesday evening 3. LOCATION: "Where on the chest is the injury located?"     Right chest pain 4. APPEARANCE: "What does the injury look like?"     No bruising  5. BLEEDING: "Is there any bleeding now? If so, ask: How long has it been bleeding?"     No bleeding 6. SEVERITY: "Any difficulty with breathing?"     SOB at times 7. SIZE: For cuts, bruises, or swelling, ask: "How large is it?" (e.g., inches or centimeters)     No cuts, bruises or sweling 8. PAIN: "Is there pain?" If so, ask: "How bad is the pain?"   (e.g., Scale 1-10; or mild, moderate, severe)     Pain 5-6. 9. TETANUS: For any breaks in the skin, ask: "When was the last tetanus booster?"     No breaks in the skin  Protocols used: CHEST INJURY-A-AH  Pt states he as getting out of the car on Wednesday evening and fell on right side. Pt has experienced pain on the right side that has been constant. Pt states he is partially paralyzed on the right side due chemo and believes that is why he had difficulty getting out of the car. Pt states he does not have any bruising or swelling to the area. Appt made for Nov.29 at 2pm.

## 2017-01-14 ENCOUNTER — Encounter: Payer: Self-pay | Admitting: Internal Medicine

## 2017-01-14 ENCOUNTER — Ambulatory Visit (INDEPENDENT_AMBULATORY_CARE_PROVIDER_SITE_OTHER): Payer: Medicare Other | Admitting: Internal Medicine

## 2017-01-14 VITALS — BP 108/80 | HR 86 | Temp 98.5°F | Ht 68.0 in | Wt 192.2 lb

## 2017-01-14 DIAGNOSIS — R0789 Other chest pain: Secondary | ICD-10-CM | POA: Diagnosis not present

## 2017-01-14 NOTE — Progress Notes (Signed)
Subjective:    Patient ID: [name], male    DOB: May 15, 1960, 56 y.o.   MRN: [id number]  HPI  56 year old patient who has a history of a right hemiparesis.  He lost his balance and fell to his right 5 days ago.  He had some acute initial right chest wall pain that remains bothersome but has improved dramatically over the past 2 days.  He has been using Advil.  Pain is aggravated by movement pressure and deep inspiration No shortness of breath  Past Medical History:  Diagnosis Date  . ALLERGIC RHINITIS 01/09/2009  . Glioblastoma multiforme ([place])    stage IV  . HYPERTENSION 01/09/2009  . SEIZURE DISORDER 01/09/2009  . Thyroid disease    Hypothyroidism     Social History   Socioeconomic History  . Marital status: Married    Spouse name: Not on file  . Number of children: Not on file  . Years of education: Not on file  . Highest education level: Not on file  Social Needs  . Financial resource strain: Not on file  . Food insecurity - worry: Not on file  . Food insecurity - inability: Not on file  . Transportation needs - medical: Not on file  . Transportation needs - non-medical: Not on file  Occupational History  . Not on file  Tobacco Use  . Smoking status: Never Smoker  . Smokeless tobacco: Never Used  Substance and Sexual Activity  . Alcohol use: No  . Drug use: No  . Sexual activity: Not on file  Other Topics Concern  . Not on file  Social History Narrative  . Not on file    Past Surgical History:  Procedure Laterality Date  . CRANIOTOMY  2009   glioblastoma    History reviewed. No pertinent family history.  Allergies  Allergen Reactions  . Pollen Extract Other (See Comments)    Other reaction: sneezing, stuffy nose  . Penicillins Rash    Current Outpatient Medications on File Prior to Visit  Medication Sig Dispense Refill  . levETIRAcetam (KEPPRA) 500 MG tablet Take 500 mg by mouth 2 (two) times daily.    Marland Kitchen levothyroxine (SYNTHROID,  LEVOTHROID) 75 MCG tablet TAKE 1 TABLET BY MOUTH ONCE DAILY 90 tablet 0  . loratadine (CLARITIN) 10 MG tablet Take 10 mg by mouth as needed.    . neomycin-polymyxin-hydrocortisone (CORTISPORIN) OTIC solution Place 3 drops into the left ear 4 (four) times daily. 10 mL 0  . Potassium (GNP POTASSIUM) 99 MG TABS Take by mouth.    . sertraline (ZOLOFT) 25 MG tablet TAKE 1 TABLET BY MOUTH ONCE DAILY 90 tablet 0  . valACYclovir (VALTREX) 1000 MG tablet Take 1,000 mg by mouth as needed.     . vitamin B-12 (CYANOCOBALAMIN) 1000 MCG tablet Take 500 mcg by mouth daily.     Marland Kitchen doxycycline (VIBRAMYCIN) 100 MG capsule Take 1 capsule (100 mg total) by mouth 2 (two) times daily. (Patient not taking: Reported on 01/14/2017) 20 capsule 0  . [DISCONTINUED] pantoprazole (PROTONIX) 40 MG tablet Take 40 mg by mouth daily.      No current facility-administered medications on file prior to visit.     BP 108/80 (BP Location: Left Arm, Patient Position: Sitting, Cuff Size: Normal)   Pulse 86   Temp 98.5 F (36.9 C) (Oral)   Ht 5\' 8"  (1.727 m)   Wt 192 lb 3.2 oz (87.2 kg)   SpO2 93%   BMI 29.22 kg/m  Review of Systems  Constitutional: Negative for appetite change, chills, fatigue and fever.  HENT: Negative for congestion, dental problem, ear pain, hearing loss, sore throat, tinnitus, trouble swallowing and voice change.   Eyes: Negative for pain, discharge and visual disturbance.  Respiratory: Negative for cough, chest tightness, wheezing and stridor.   Cardiovascular: Positive for chest pain. Negative for palpitations and leg swelling.  Gastrointestinal: Negative for abdominal distention, abdominal pain, blood in stool, constipation, diarrhea, nausea and vomiting.  Genitourinary: Negative for difficulty urinating, discharge, flank pain, genital sores, hematuria and urgency.  Musculoskeletal: Negative for arthralgias, back pain, gait problem, joint swelling, myalgias and neck stiffness.  Skin: Negative for  rash.  Neurological: Negative for dizziness, syncope, speech difficulty, weakness, numbness and headaches.  Hematological: Negative for adenopathy. Does not bruise/bleed easily.  Psychiatric/Behavioral: Negative for behavioral problems and dysphoric mood. The patient is not nervous/anxious.        Objective:   Physical Exam  Constitutional: He appears well-developed and well-nourished. No distress.  Eyes: Pupils are equal, round, and reactive to light.  Cardiovascular: Normal rate, regular rhythm and normal heart sounds.  Pulmonary/Chest: Effort normal and breath sounds normal. No respiratory distress. He exhibits tenderness.  Skin:  No bruising along the right chest wall          Assessment & Plan:   Traumatic right chest wall pain.  Contusion versus fracture.  Options discussed including radiographs.  Patient has been using Advil but not for the past 2 days.  He states he does not require any additional analgesics. Patient has equal ventilation both lung fields.  We will continue to observe and treat symptomatically  Nyoka Cowden

## 2017-01-14 NOTE — Patient Instructions (Addendum)
Call or return to clinic prn if these symptoms worsen or fail to improve as anticipated. Annual CPX as scheduled  Neurology follow-up with brain MRI  as scheduled

## 2017-01-14 NOTE — Telephone Encounter (Signed)
Pt was seen today at 2:30pm OV

## 2017-01-16 ENCOUNTER — Ambulatory Visit: Payer: Medicare Other | Admitting: Internal Medicine

## 2017-01-22 ENCOUNTER — Other Ambulatory Visit: Payer: Self-pay | Admitting: Internal Medicine

## 2017-02-04 ENCOUNTER — Other Ambulatory Visit: Payer: Self-pay | Admitting: Internal Medicine

## 2017-03-03 ENCOUNTER — Ambulatory Visit: Payer: Medicare Other | Admitting: Nurse Practitioner

## 2017-03-04 DIAGNOSIS — C712 Malignant neoplasm of temporal lobe: Secondary | ICD-10-CM | POA: Diagnosis not present

## 2017-03-18 ENCOUNTER — Other Ambulatory Visit: Payer: Self-pay | Admitting: Oncology

## 2017-03-27 ENCOUNTER — Other Ambulatory Visit: Payer: Self-pay | Admitting: Internal Medicine

## 2017-04-01 DIAGNOSIS — C712 Malignant neoplasm of temporal lobe: Secondary | ICD-10-CM | POA: Diagnosis not present

## 2017-04-02 ENCOUNTER — Other Ambulatory Visit: Payer: Self-pay | Admitting: Internal Medicine

## 2017-04-29 DIAGNOSIS — C712 Malignant neoplasm of temporal lobe: Secondary | ICD-10-CM | POA: Diagnosis not present

## 2017-04-29 DIAGNOSIS — B001 Herpesviral vesicular dermatitis: Secondary | ICD-10-CM | POA: Diagnosis not present

## 2017-05-23 DIAGNOSIS — H903 Sensorineural hearing loss, bilateral: Secondary | ICD-10-CM | POA: Diagnosis not present

## 2017-05-27 DIAGNOSIS — C712 Malignant neoplasm of temporal lobe: Secondary | ICD-10-CM | POA: Diagnosis not present

## 2017-06-10 ENCOUNTER — Telehealth: Payer: Self-pay | Admitting: Family Medicine

## 2017-06-10 NOTE — Telephone Encounter (Signed)
Copied from [place] [id number]. Topic: Quick Communication - See Telephone Encounter >> Jun 10, 2017 11:17 AM Antonieta Iba C wrote: CRM for notification. See Telephone encounter for: 06/10/17.  Pt says that he is on a set up with [facility]. Pt's Dr. Is requesting to have the following lab draws. Thyroid, Lipid Panel, Testosterone, Vitamin D and B-12 . Pt says that he has to have a lab draw every 28 days according to his [facility] provider.   Please assist pt with scheduling.   CB: [phone]  Thanks.

## 2017-06-12 ENCOUNTER — Other Ambulatory Visit: Payer: Self-pay

## 2017-06-12 DIAGNOSIS — I10 Essential (primary) hypertension: Secondary | ICD-10-CM

## 2017-06-12 DIAGNOSIS — E559 Vitamin D deficiency, unspecified: Secondary | ICD-10-CM

## 2017-06-12 DIAGNOSIS — R7989 Other specified abnormal findings of blood chemistry: Secondary | ICD-10-CM

## 2017-06-12 DIAGNOSIS — E039 Hypothyroidism, unspecified: Secondary | ICD-10-CM

## 2017-06-12 DIAGNOSIS — E538 Deficiency of other specified B group vitamins: Secondary | ICD-10-CM

## 2017-06-12 NOTE — Telephone Encounter (Signed)
Spoke to patient and informed him that he can come in at anytime to have the lab work done. I advise him to keep up with his 28 days and call a head of time to schedule an appointment. Consecutive lab orders placed for a year. No further action needed.

## 2017-06-12 NOTE — Telephone Encounter (Signed)
Okay for lab draw? 

## 2017-06-13 ENCOUNTER — Other Ambulatory Visit (INDEPENDENT_AMBULATORY_CARE_PROVIDER_SITE_OTHER): Payer: Medicare HMO

## 2017-06-13 DIAGNOSIS — E039 Hypothyroidism, unspecified: Secondary | ICD-10-CM | POA: Diagnosis not present

## 2017-06-13 DIAGNOSIS — E559 Vitamin D deficiency, unspecified: Secondary | ICD-10-CM

## 2017-06-13 DIAGNOSIS — I10 Essential (primary) hypertension: Secondary | ICD-10-CM | POA: Diagnosis not present

## 2017-06-13 DIAGNOSIS — E538 Deficiency of other specified B group vitamins: Secondary | ICD-10-CM

## 2017-06-13 DIAGNOSIS — R7989 Other specified abnormal findings of blood chemistry: Secondary | ICD-10-CM | POA: Diagnosis not present

## 2017-06-13 LAB — CBC WITH DIFFERENTIAL/PLATELET
BASOS PCT: 0.6 % (ref 0.0–3.0)
Basophils Absolute: 0 10*3/uL (ref 0.0–0.1)
EOS PCT: 2.1 % (ref 0.0–5.0)
Eosinophils Absolute: 0.1 10*3/uL (ref 0.0–0.7)
HCT: 38.7 % — ABNORMAL LOW (ref 39.0–52.0)
HEMOGLOBIN: 13.2 g/dL (ref 13.0–17.0)
Lymphocytes Relative: 32.1 % (ref 12.0–46.0)
Lymphs Abs: 1.3 10*3/uL (ref 0.7–4.0)
MCHC: 34.2 g/dL (ref 30.0–36.0)
MCV: 99.2 fl (ref 78.0–100.0)
MONO ABS: 0.3 10*3/uL (ref 0.1–1.0)
Monocytes Relative: 6.7 % (ref 3.0–12.0)
Neutro Abs: 2.4 10*3/uL (ref 1.4–7.7)
Neutrophils Relative %: 58.5 % (ref 43.0–77.0)
Platelets: 187 10*3/uL (ref 150.0–400.0)
RBC: 3.9 Mil/uL — ABNORMAL LOW (ref 4.22–5.81)
RDW: 13.9 % (ref 11.5–15.5)
WBC: 4.1 10*3/uL (ref 4.0–10.5)

## 2017-06-13 LAB — LIPID PANEL
CHOLESTEROL: 198 mg/dL (ref 0–200)
HDL: 44.2 mg/dL (ref >39.00)
LDL CALC: 118 mg/dL — AB (ref 0–99)
NONHDL: 153.41
Total CHOL/HDL Ratio: 4
Triglycerides: 175 mg/dL — ABNORMAL HIGH (ref 0.0–149.0)
VLDL: 35 mg/dL (ref 0.0–40.0)

## 2017-06-13 LAB — TESTOSTERONE: Testosterone: 365.68 ng/dL (ref 300.00–890.00)

## 2017-06-13 LAB — VITAMIN B12: Vitamin B-12: 756 pg/mL (ref 211–911)

## 2017-06-13 LAB — TSH: TSH: 1.67 u[IU]/mL (ref 0.35–4.50)

## 2017-06-13 LAB — VITAMIN D 25 HYDROXY (VIT D DEFICIENCY, FRACTURES): VITD: 21.18 ng/mL — AB (ref 30.00–100.00)

## 2017-06-17 ENCOUNTER — Telehealth: Payer: Self-pay | Admitting: Family Medicine

## 2017-06-17 NOTE — Telephone Encounter (Signed)
Copied from [place] [id number]. Topic: Quick Communication - Lab Results >> Jun 17, 2017 10:40 AM Carolyn Stare wrote:  Pt would like a call back with his lab results

## 2017-06-23 DIAGNOSIS — C712 Malignant neoplasm of temporal lobe: Secondary | ICD-10-CM | POA: Diagnosis not present

## 2017-06-27 ENCOUNTER — Other Ambulatory Visit: Payer: Self-pay | Admitting: Internal Medicine

## 2017-06-28 ENCOUNTER — Other Ambulatory Visit: Payer: Self-pay | Admitting: Internal Medicine

## 2017-07-29 DIAGNOSIS — C712 Malignant neoplasm of temporal lobe: Secondary | ICD-10-CM | POA: Diagnosis not present

## 2017-08-18 DIAGNOSIS — R569 Unspecified convulsions: Secondary | ICD-10-CM | POA: Diagnosis not present

## 2017-08-18 DIAGNOSIS — Z8249 Family history of ischemic heart disease and other diseases of the circulatory system: Secondary | ICD-10-CM | POA: Diagnosis not present

## 2017-08-18 DIAGNOSIS — R69 Illness, unspecified: Secondary | ICD-10-CM | POA: Diagnosis not present

## 2017-08-18 DIAGNOSIS — Z7982 Long term (current) use of aspirin: Secondary | ICD-10-CM | POA: Diagnosis not present

## 2017-08-18 DIAGNOSIS — E039 Hypothyroidism, unspecified: Secondary | ICD-10-CM | POA: Diagnosis not present

## 2017-08-18 DIAGNOSIS — C719 Malignant neoplasm of brain, unspecified: Secondary | ICD-10-CM | POA: Diagnosis not present

## 2017-08-18 DIAGNOSIS — Z809 Family history of malignant neoplasm, unspecified: Secondary | ICD-10-CM | POA: Diagnosis not present

## 2017-08-26 DIAGNOSIS — C712 Malignant neoplasm of temporal lobe: Secondary | ICD-10-CM | POA: Diagnosis not present

## 2017-09-23 DIAGNOSIS — C712 Malignant neoplasm of temporal lobe: Secondary | ICD-10-CM | POA: Diagnosis not present

## 2017-10-07 ENCOUNTER — Other Ambulatory Visit: Payer: Self-pay | Admitting: Internal Medicine

## 2017-10-10 ENCOUNTER — Other Ambulatory Visit: Payer: Self-pay

## 2017-10-15 ENCOUNTER — Other Ambulatory Visit: Payer: Self-pay

## 2017-10-21 DIAGNOSIS — C712 Malignant neoplasm of temporal lobe: Secondary | ICD-10-CM | POA: Diagnosis not present

## 2017-10-24 ENCOUNTER — Other Ambulatory Visit: Payer: Self-pay

## 2017-10-24 MED ORDER — LEVOTHYROXINE SODIUM 75 MCG PO TABS: 75.0000 ug | ORAL_TABLET | Freq: Every day | ORAL | 1 refills | Status: AC

## 2017-11-18 DIAGNOSIS — C712 Malignant neoplasm of temporal lobe: Secondary | ICD-10-CM | POA: Diagnosis not present

## 2017-12-16 DIAGNOSIS — C712 Malignant neoplasm of temporal lobe: Secondary | ICD-10-CM | POA: Diagnosis not present

## 2017-12-19 ENCOUNTER — Other Ambulatory Visit: Payer: Self-pay | Admitting: Internal Medicine

## 2017-12-22 NOTE — Telephone Encounter (Signed)
Called pt no answer. Pt needs to make an appt for more refills.

## 2017-12-22 NOTE — Telephone Encounter (Signed)
Patient need to schedule an ov for more refills. 

## 2017-12-23 ENCOUNTER — Ambulatory Visit (INDEPENDENT_AMBULATORY_CARE_PROVIDER_SITE_OTHER): Payer: Medicare HMO | Admitting: *Deleted

## 2017-12-23 ENCOUNTER — Telehealth: Payer: Self-pay

## 2017-12-23 DIAGNOSIS — Z23 Encounter for immunization: Secondary | ICD-10-CM | POA: Diagnosis not present

## 2017-12-23 NOTE — Telephone Encounter (Signed)
Patient called to scheduled appointment with Benay Spice per 11/5 vm return call. Mailed a letter of this appointment with a calender enclosed.

## 2018-01-13 DIAGNOSIS — Z9889 Other specified postprocedural states: Secondary | ICD-10-CM | POA: Diagnosis not present

## 2018-01-13 DIAGNOSIS — C712 Malignant neoplasm of temporal lobe: Secondary | ICD-10-CM | POA: Diagnosis not present

## 2018-01-20 ENCOUNTER — Telehealth: Payer: Self-pay | Admitting: Oncology

## 2018-01-20 ENCOUNTER — Inpatient Hospital Stay: Payer: Medicare HMO | Attending: Oncology | Admitting: Oncology

## 2018-01-20 VITALS — BP 116/84 | HR 77 | Temp 97.9°F | Resp 16 | Ht 68.0 in | Wt 193.4 lb

## 2018-01-20 DIAGNOSIS — C719 Malignant neoplasm of brain, unspecified: Secondary | ICD-10-CM | POA: Diagnosis not present

## 2018-01-20 NOTE — Telephone Encounter (Signed)
Referral sent 

## 2018-01-20 NOTE — Progress Notes (Signed)
[place] OFFICE PROGRESS NOTE   Diagnosis: Glioblastoma  INTERVAL HISTORY:   [name] returns for a scheduled visit.  He continues monthly vaccine therapy at [facility]  He feels well.  He continues to have right-sided motor issues with lack of coordination and range of motion.  He has been referred to consider Botox therapy.  Objective:  Vital signs in last 24 hours:  Blood pressure 116/84, pulse 77, temperature 97.9 F (36.6 C), temperature source Oral, resp. rate 16, height 5' 8" (1.727 m), weight 193 lb 6.4 oz (87.7 kg), SpO2 94 %.  Resp: Lungs clear bilaterally Cardio: Regular rate and rhythm GI: No hepatosplenomegaly Vascular: No leg edema Neuro: 4/5 strength at the right arm, 3/5 strength at the right hand 2-3 strength with dorsiflexion at the right foot     Medications: I have reviewed the patient's current medications.   Assessment/Plan: 1. 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 An MRI of the brain during a hospital admission on 03/27/2010 was compared to an MRI from [place] on 02/22/2010. There was an increase in the abnormal signal of the left posterior opercular region and periatrial region, concerning for progression of tumor. Review of the MRI by Dr. Ferdinand Lango at [facility] confirmed disease progression. Avastin was resumed on 05/28/2010. Hydroxyurea was initiated on 06/07/2010. The hydroxyurea was placed on hold on 06/27/2010 due to leukopenia and thrombocytopenia.  1. Initiation of salvage therapy with temozolomide and the Celldex vaccine beginning on 08/16/2010.  2. MRI of the brain at [facility] on 10/31/2010 is concerning for disease progression with an interval increase in enhancement surrounding the resection cavity and inferior margin of the putamen and left optic tract. 3. MRI of the brain at [facility] on 11/28/2010 showed a slight interval increase in peripheral nodular enhancement surrounding the resection cavity 4. He underwent a repeat MRI of the  brain at [facility] on 12/26/2010-this showed a stable appearance of the left temporal lobe resection cavity and marginal enhancement. 5. Restaging MRI of the brain 12/26/2010 at [facility] with a slight interval decrease in the size of residual left temporal lobe tumor 6. Restaging MRI of the brain at [facility] on 02/20/2011 revealed a slight interval decrease in the residual left temporal lobe tumor 7. Restaging MRI of the brain at [facility] on 04/03/2011 revealed stable disease. 8. MRI 06/12/2011 at [facility] with stable left temporal resection cavity with decreased peripheral enhancement 9. He completed a final cycle of temozolomide beginning on 06/27/11 and continues monthly vaccine therapy 10. PET scan on may 29th 2013 at [facility] definite foci of hypermetabolic FDG activity identified, in the region of the irregular nodular MRI enhancement in the inferior left temporal lobe there are no definite foci of hypermetabolic activity, there was low level FDG activity in this location similar to the surrounding nonenhancing left temporal lobe 11. Restaging MRI of the brain at [facility] on 11/06/2011-stable left brain resection site and? New less than 1 cm left brain lesion, no enhancement on a PET scan 12. Stable radiographic disease on an MRI 12/31/2011 13. Continued treatment with the Celldex vaccine with an MRI of the brain at [facility] on 09/09/2012 revealed overall stable disease aside from a slight increase in a nodular focus of enhancement the left anterior temporal lobe with central necrosis 14. MRI 12/02/2012 with increased enhancement in the anterior left temporal area 15. MRI guided left temporal resection 01/07/2013-negative for tumor 16. Continuation of treatment on the Celldex vaccine, treatment #32 on 01/27/2013 17. Restaging MRI at [facility] 03/08/2014 with no  evidence of disease progression 18. Dose #46 of the Celldex vaccine on 03/08/2014 19. MRI 01/13/2018- stable disease 20. Dose #95  Celldex  vaccine 01/13/2018 21. MRI at  [facility] 01/02/2015 without evidence of disease progression, dose #56 Celldex vaccine 22. MRI at [facility] 06/19/2015-stable,Celldex # 64 given 08/15/2015 23. MRI at [facility] 05/21/2016-increase in size of focal nodular enhancement at the left temporal resection cavity 2. 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 3. 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 Increase in the Keppra dose 09/09/2012 secondary to the possibility of visual seizures, Keppra dose increased 2017 secondary to episodes of aphasia-improved 5. Hypothyroidism, maintained on Synthroid. 6. History of Hypertension, essential hypertension and an effect of Avastin. Improved 7. History of nephrotic syndrome secondary to Avastin. 8. History of progressive thrombocytopenia, status post a bone marrow biopsy on 04/26/2010 with findings of a hypocellular marrow with decreased megakaryocytes and a left shift in the myeloid and erythroid series and minimal dyspoiesis. Cytogenetics returned normal. He again developed thrombocytopenia after receiving Avastin and beginning hydroxyurea in April 2012. The platelet count has improved since the Avastin and hydroxyurea were discontinued.  9. Vitamin B12 deficiency. 10. Acute diverticulitis in April 2012, improved with ciprofloxacin and Flagyl. Related to Avastin (?).  11. Adhesive capsulitis of the right shoulder, followed by Dr. Amedeo Plenty. He is no longer taking pain medication and the range of motion has improved after a physical therapy program.  12. 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 Low serum testosterone level on labs requested by Dr. Ferdinand Lango in November 2012-he is not taking testosterone   14. Subdural hemorrhage after a fall requiring surgical evacuation in May 2015  15. Fall outside of  his car 03/30/2014 with scalp abrasion/ecchymoses     Disposition: Mr. Gunawan remains in clinical remission from the glioblastoma.  He will continue compassionate use of the celldex vaccine at [facility].  He is followed closely in the brain tumor clinic at [facility].  He will return for an office visit in 9 months.  He would like to obtain a new internal medicine physician.  Dr. Burnice Logan has retired.  I will refer him to Dr. Lavone Orn.  Betsy Coder, MD  01/20/2018  8:24 AM

## 2018-01-20 NOTE — Telephone Encounter (Signed)
Printed calendar and avs. °

## 2018-01-29 ENCOUNTER — Other Ambulatory Visit: Payer: Self-pay | Admitting: Internal Medicine

## 2018-02-02 ENCOUNTER — Telehealth: Payer: Self-pay | Admitting: *Deleted

## 2018-02-02 NOTE — Telephone Encounter (Signed)
Called to request refill on his Zoloft, but while awaiting return call, [facility] agreed to fill it for him once. Asking for status of his referral to Dr. Lavone Orn. Informed him this was still pending-Dr. Benay Spice will be calling soon to request this referral. Informed him we will manage his meds until he is established with a new PCP.

## 2018-02-09 DIAGNOSIS — C712 Malignant neoplasm of temporal lobe: Secondary | ICD-10-CM | POA: Diagnosis not present

## 2018-02-12 DIAGNOSIS — C719 Malignant neoplasm of brain, unspecified: Secondary | ICD-10-CM | POA: Diagnosis not present

## 2018-02-12 DIAGNOSIS — E039 Hypothyroidism, unspecified: Secondary | ICD-10-CM | POA: Diagnosis not present

## 2018-02-12 DIAGNOSIS — G8191 Hemiplegia, unspecified affecting right dominant side: Secondary | ICD-10-CM | POA: Diagnosis not present

## 2018-02-12 DIAGNOSIS — Z6830 Body mass index (BMI) 30.0-30.9, adult: Secondary | ICD-10-CM | POA: Diagnosis not present

## 2018-02-12 DIAGNOSIS — G3184 Mild cognitive impairment, so stated: Secondary | ICD-10-CM | POA: Diagnosis not present

## 2018-02-12 DIAGNOSIS — E669 Obesity, unspecified: Secondary | ICD-10-CM | POA: Diagnosis not present

## 2018-03-10 DIAGNOSIS — C712 Malignant neoplasm of temporal lobe: Secondary | ICD-10-CM | POA: Diagnosis not present

## 2018-03-18 DIAGNOSIS — R29898 Other symptoms and signs involving the musculoskeletal system: Secondary | ICD-10-CM | POA: Diagnosis not present

## 2018-04-07 DIAGNOSIS — C712 Malignant neoplasm of temporal lobe: Secondary | ICD-10-CM | POA: Diagnosis not present

## 2018-05-05 DIAGNOSIS — Z79899 Other long term (current) drug therapy: Secondary | ICD-10-CM | POA: Diagnosis not present

## 2018-05-05 DIAGNOSIS — C712 Malignant neoplasm of temporal lobe: Secondary | ICD-10-CM | POA: Diagnosis not present

## 2018-06-02 DIAGNOSIS — Z923 Personal history of irradiation: Secondary | ICD-10-CM | POA: Diagnosis not present

## 2018-06-02 DIAGNOSIS — Z9221 Personal history of antineoplastic chemotherapy: Secondary | ICD-10-CM | POA: Diagnosis not present

## 2018-06-02 DIAGNOSIS — C712 Malignant neoplasm of temporal lobe: Secondary | ICD-10-CM | POA: Diagnosis not present

## 2018-06-02 DIAGNOSIS — Z9889 Other specified postprocedural states: Secondary | ICD-10-CM | POA: Diagnosis not present

## 2018-06-30 DIAGNOSIS — Z79899 Other long term (current) drug therapy: Secondary | ICD-10-CM | POA: Diagnosis not present

## 2018-06-30 DIAGNOSIS — C712 Malignant neoplasm of temporal lobe: Secondary | ICD-10-CM | POA: Diagnosis not present

## 2018-07-28 DIAGNOSIS — Z9889 Other specified postprocedural states: Secondary | ICD-10-CM | POA: Diagnosis not present

## 2018-07-28 DIAGNOSIS — Z923 Personal history of irradiation: Secondary | ICD-10-CM | POA: Diagnosis not present

## 2018-07-28 DIAGNOSIS — C712 Malignant neoplasm of temporal lobe: Secondary | ICD-10-CM | POA: Diagnosis not present

## 2018-07-28 DIAGNOSIS — R531 Weakness: Secondary | ICD-10-CM | POA: Diagnosis not present

## 2018-08-17 DIAGNOSIS — Z0000 Encounter for general adult medical examination without abnormal findings: Secondary | ICD-10-CM | POA: Diagnosis not present

## 2018-08-17 DIAGNOSIS — G8191 Hemiplegia, unspecified affecting right dominant side: Secondary | ICD-10-CM | POA: Diagnosis not present

## 2018-08-17 DIAGNOSIS — Z136 Encounter for screening for cardiovascular disorders: Secondary | ICD-10-CM | POA: Diagnosis not present

## 2018-08-17 DIAGNOSIS — E039 Hypothyroidism, unspecified: Secondary | ICD-10-CM | POA: Diagnosis not present

## 2018-08-17 DIAGNOSIS — R739 Hyperglycemia, unspecified: Secondary | ICD-10-CM | POA: Diagnosis not present

## 2018-08-17 DIAGNOSIS — C719 Malignant neoplasm of brain, unspecified: Secondary | ICD-10-CM | POA: Diagnosis not present

## 2018-08-25 DIAGNOSIS — C712 Malignant neoplasm of temporal lobe: Secondary | ICD-10-CM | POA: Diagnosis not present

## 2018-08-25 DIAGNOSIS — Z79899 Other long term (current) drug therapy: Secondary | ICD-10-CM | POA: Diagnosis not present

## 2018-09-22 DIAGNOSIS — C712 Malignant neoplasm of temporal lobe: Secondary | ICD-10-CM | POA: Diagnosis not present

## 2018-10-20 DIAGNOSIS — Z923 Personal history of irradiation: Secondary | ICD-10-CM | POA: Diagnosis not present

## 2018-10-20 DIAGNOSIS — R29898 Other symptoms and signs involving the musculoskeletal system: Secondary | ICD-10-CM | POA: Diagnosis not present

## 2018-10-20 DIAGNOSIS — Z9889 Other specified postprocedural states: Secondary | ICD-10-CM | POA: Diagnosis not present

## 2018-10-20 DIAGNOSIS — C712 Malignant neoplasm of temporal lobe: Secondary | ICD-10-CM | POA: Diagnosis not present

## 2018-10-21 DIAGNOSIS — L219 Seborrheic dermatitis, unspecified: Secondary | ICD-10-CM | POA: Diagnosis not present

## 2018-10-21 DIAGNOSIS — D229 Melanocytic nevi, unspecified: Secondary | ICD-10-CM | POA: Diagnosis not present

## 2018-10-21 DIAGNOSIS — L570 Actinic keratosis: Secondary | ICD-10-CM | POA: Diagnosis not present

## 2018-10-22 ENCOUNTER — Telehealth: Payer: Self-pay | Admitting: Oncology

## 2018-10-22 ENCOUNTER — Inpatient Hospital Stay: Payer: Medicare HMO | Attending: Oncology | Admitting: Oncology

## 2018-10-22 ENCOUNTER — Other Ambulatory Visit: Payer: Self-pay

## 2018-10-22 VITALS — BP 123/72 | HR 85 | Temp 98.5°F | Resp 17 | Ht 68.0 in | Wt 183.7 lb

## 2018-10-22 DIAGNOSIS — E538 Deficiency of other specified B group vitamins: Secondary | ICD-10-CM | POA: Diagnosis not present

## 2018-10-22 DIAGNOSIS — E039 Hypothyroidism, unspecified: Secondary | ICD-10-CM | POA: Insufficient documentation

## 2018-10-22 DIAGNOSIS — F4321 Adjustment disorder with depressed mood: Secondary | ICD-10-CM | POA: Diagnosis not present

## 2018-10-22 DIAGNOSIS — I10 Essential (primary) hypertension: Secondary | ICD-10-CM | POA: Diagnosis not present

## 2018-10-22 DIAGNOSIS — R69 Illness, unspecified: Secondary | ICD-10-CM | POA: Diagnosis not present

## 2018-10-22 DIAGNOSIS — C719 Malignant neoplasm of brain, unspecified: Secondary | ICD-10-CM | POA: Insufficient documentation

## 2018-10-22 NOTE — Telephone Encounter (Signed)
Gave avs and calendar ° °

## 2018-10-22 NOTE — Progress Notes (Signed)
[place] OFFICE PROGRESS NOTE   Diagnosis: Glioblastoma  INTERVAL HISTORY:   [name] returns for scheduled visit.  He feels well.  No seizure.  No new neurologic symptoms.  He continues to have difficulty with coordination at the right arm and leg.  He has a right visual field loss. He remains on monthly vaccine treatment at [facility].  An MRI 08/25/2018 was stable. He reports intentional weight loss. Objective:  Vital signs in last 24 hours:  Blood pressure 123/72, pulse 85, temperature 98.5 F (36.9 C), temperature source Temporal, resp. rate 17, height 5' 8"  (1.727 m), weight 183 lb 11.2 oz (83.3 kg), SpO2 97 %.   Limited physical examination secondary to distancing with the COVID pandemic Vascular: Alert and oriented Neuro: Right hemianopsia, mild weakness of the right arm with significant weakness of the right hand.  The leg strength appears intact.      Lab Results:  Lab Results  Component Value Date   WBC 4.1 06/13/2017   HGB 13.2 06/13/2017   HCT 38.7 (L) 06/13/2017   MCV 99.2 06/13/2017   PLT 187.0 06/13/2017   NEUTROABS 2.4 06/13/2017    CMP  Lab Results  Component Value Date   NA 135 (A) 06/18/2016   K 3.9 06/18/2016   CL 109 02/10/2013   CO2 23 02/10/2013   GLUCOSE 90 02/10/2013   BUN 17 06/18/2016   CREATININE 1.1 06/18/2016   CALCIUM 8.8 02/10/2013   PROT 6.8 02/10/2013   ALBUMIN 3.8 02/10/2013   AST 28 06/18/2016   ALT 28 06/18/2016   ALKPHOS 71 06/18/2016   BILITOT 0.6 02/10/2013   GFRNONAA >60 05/12/2010   GFRAA  05/12/2010    >60        The eGFR has been calculated using the MDRD equation. This calculation has not been validated in all clinical situations. eGFR's persistently <60 mL/min signify possible Chronic Kidney Disease.     Medications: I have reviewed the patient's current medications.   Assessment/Plan: 1. 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 An MRI of the brain during a hospital admission  on 03/27/2010 was compared to an MRI from [place] on 02/22/2010. There was an increase in the abnormal signal of the left posterior opercular region and periatrial region, concerning for progression of tumor. Review of the MRI by Dr. Ferdinand Lango at [facility] confirmed disease progression. Avastin was resumed on 05/28/2010. Hydroxyurea was initiated on 06/07/2010. The hydroxyurea was placed on hold on 06/27/2010 due to leukopenia and thrombocytopenia.  1. Initiation of salvage therapy with temozolomide and the Celldex vaccine beginning on 08/16/2010.  2. MRI of the brain at [facility] on 10/31/2010 is concerning for disease progression with an interval increase in enhancement surrounding the resection cavity and inferior margin of the putamen and left optic tract. 3. MRI of the brain at [facility] on 11/28/2010 showed a slight interval increase in peripheral nodular enhancement surrounding the resection cavity 4. He underwent a repeat MRI of the brain at [facility] on 12/26/2010-this showed a stable appearance of the left temporal lobe resection cavity and marginal enhancement. 5. Restaging MRI of the brain 12/26/2010 at [facility] with a slight interval decrease in the size of residual left temporal lobe tumor 6. Restaging MRI of the brain at [facility] on 02/20/2011 revealed a slight interval decrease in the residual left temporal lobe tumor 7. Restaging MRI of the brain at [facility] on 04/03/2011 revealed stable disease. 8. MRI 06/12/2011 at [facility] with stable left temporal resection cavity with decreased peripheral enhancement  9. He completed a final cycle of temozolomide beginning on 06/27/11 and continues monthly vaccine therapy 10. PET scan on may 29th 2013 at [facility] definite foci of hypermetabolic FDG activity identified, in the region of the irregular nodular MRI enhancement in the inferior left temporal lobe there are no definite foci of hypermetabolic activity, there was low level FDG activity in this location similar to the surrounding  nonenhancing left temporal lobe 11. Restaging MRI of the brain at [facility] on 11/06/2011-stable left brain resection site and? New less than 1 cm left brain lesion, no enhancement on a PET scan 12. Stable radiographic disease on an MRI 12/31/2011 13. Continued treatment with the Celldex vaccine with an MRI of the brain at [facility] on 09/09/2012 revealed overall stable disease aside from a slight increase in a nodular focus of enhancement the left anterior temporal lobe with central necrosis 14. MRI 12/02/2012 with increased enhancement in the anterior left temporal area 15. MRI guided left temporal resection 01/07/2013-negative for tumor 16. Continuation of treatment on the Celldex vaccine, treatment #32 on 01/27/2013 17. Restaging MRI at [facility] 03/08/2014 with no evidence of disease progression 18. Dose #46 of the Celldex vaccine on 03/08/2014 19. MRI 01/13/2018- stable disease 20. Dose #95  Celldex  vaccine 01/13/2018 21. MRI at [facility] 01/02/2015 without evidence of disease progression, dose #56 Celldex vaccine 22. MRI at [facility] 06/19/2015-stable,Celldex # 64 given 08/15/2015 23. MRI at [facility] 05/21/2016-increase in size of focal nodular enhancement at the left temporal resection cavity 24. MRI at [facility] 08/25/2018-stable 2. 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 3. 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 Increase in the Keppra dose 09/09/2012 secondary to the possibility of visual seizures, Keppra dose increased 2017 secondary to episodes of aphasia-improved 5. Hypothyroidism, maintained on Synthroid. 6. History of Hypertension, essential hypertension and an effect of Avastin. Improved 7. History of nephrotic syndrome secondary to Avastin. 8. History of progressive thrombocytopenia, status post a bone marrow biopsy on  04/26/2010 with findings of a hypocellular marrow with decreased megakaryocytes and a left shift in the myeloid and erythroid series and minimal dyspoiesis. Cytogenetics returned normal. He again developed thrombocytopenia after receiving Avastin and beginning hydroxyurea in April 2012. The platelet count has improved since the Avastin and hydroxyurea were discontinued.  9. Vitamin B12 deficiency. 10. Acute diverticulitis in April 2012, improved with ciprofloxacin and Flagyl. Related to Avastin (?).  11. Adhesive capsulitis of the right shoulder, followed by Dr. Amedeo Plenty. He is no longer taking pain medication and the range of motion has improved after a physical therapy program.  12. 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 Low serum testosterone level on labs requested by Dr. Ferdinand Lango in November 2012-he is not taking testosterone   14. Subdural hemorrhage after a fall requiring surgical evacuation in May 2015  15. Fall outside of his car 03/30/2014 with scalp abrasion/ecchymoses    Disposition: Mr. Craze appears unchanged.  He continues monthly vaccine therapy at [facility].  He is followed with repeat MRIs by Dr. Ferdinand Lango.  He will return for office visit in 9 months.  I am available to see him sooner as needed.  There is no clinical evidence for progression of the glioblastoma.  Betsy Coder, MD  10/22/2018  1:27 PM

## 2018-11-17 DIAGNOSIS — C719 Malignant neoplasm of brain, unspecified: Secondary | ICD-10-CM | POA: Diagnosis not present

## 2018-11-17 DIAGNOSIS — C712 Malignant neoplasm of temporal lobe: Secondary | ICD-10-CM | POA: Diagnosis not present

## 2018-11-17 DIAGNOSIS — Z923 Personal history of irradiation: Secondary | ICD-10-CM | POA: Diagnosis not present

## 2018-11-17 DIAGNOSIS — Z79899 Other long term (current) drug therapy: Secondary | ICD-10-CM | POA: Diagnosis not present

## 2018-12-15 DIAGNOSIS — Z79899 Other long term (current) drug therapy: Secondary | ICD-10-CM | POA: Diagnosis not present

## 2018-12-15 DIAGNOSIS — C712 Malignant neoplasm of temporal lobe: Secondary | ICD-10-CM | POA: Diagnosis not present

## 2018-12-15 DIAGNOSIS — Z923 Personal history of irradiation: Secondary | ICD-10-CM | POA: Diagnosis not present

## 2019-01-01 DIAGNOSIS — R69 Illness, unspecified: Secondary | ICD-10-CM | POA: Diagnosis not present

## 2019-01-12 DIAGNOSIS — Z9889 Other specified postprocedural states: Secondary | ICD-10-CM | POA: Diagnosis not present

## 2019-01-12 DIAGNOSIS — R29898 Other symptoms and signs involving the musculoskeletal system: Secondary | ICD-10-CM | POA: Diagnosis not present

## 2019-01-12 DIAGNOSIS — C712 Malignant neoplasm of temporal lobe: Secondary | ICD-10-CM | POA: Diagnosis not present

## 2019-01-12 DIAGNOSIS — R252 Cramp and spasm: Secondary | ICD-10-CM | POA: Diagnosis not present

## 2019-02-16 DIAGNOSIS — C719 Malignant neoplasm of brain, unspecified: Secondary | ICD-10-CM | POA: Diagnosis not present

## 2019-02-16 DIAGNOSIS — C712 Malignant neoplasm of temporal lobe: Secondary | ICD-10-CM | POA: Diagnosis not present

## 2019-03-16 DIAGNOSIS — C712 Malignant neoplasm of temporal lobe: Secondary | ICD-10-CM | POA: Diagnosis not present

## 2019-04-13 DIAGNOSIS — C712 Malignant neoplasm of temporal lobe: Secondary | ICD-10-CM | POA: Diagnosis not present

## 2019-04-28 ENCOUNTER — Telehealth: Payer: Self-pay | Admitting: Oncology

## 2019-04-28 NOTE — Telephone Encounter (Signed)
Rescheduled 6/3 appt to 6/10 per provider PAL. Left voicemail with new appt details. Mailed a reminder letter and calendar.

## 2019-05-11 DIAGNOSIS — C712 Malignant neoplasm of temporal lobe: Secondary | ICD-10-CM | POA: Diagnosis not present

## 2019-06-07 DIAGNOSIS — C712 Malignant neoplasm of temporal lobe: Secondary | ICD-10-CM | POA: Diagnosis not present

## 2019-07-06 DIAGNOSIS — C712 Malignant neoplasm of temporal lobe: Secondary | ICD-10-CM | POA: Diagnosis not present

## 2019-07-22 ENCOUNTER — Ambulatory Visit: Payer: Medicare HMO | Admitting: Oncology

## 2019-07-27 DIAGNOSIS — C712 Malignant neoplasm of temporal lobe: Secondary | ICD-10-CM | POA: Diagnosis not present

## 2019-07-29 ENCOUNTER — Telehealth: Payer: Self-pay | Admitting: Oncology

## 2019-07-29 ENCOUNTER — Inpatient Hospital Stay: Payer: Medicare HMO | Attending: Oncology | Admitting: Oncology

## 2019-07-29 ENCOUNTER — Other Ambulatory Visit: Payer: Self-pay

## 2019-07-29 VITALS — BP 128/89 | HR 98 | Temp 97.9°F | Resp 20 | Ht 68.0 in | Wt 191.4 lb

## 2019-07-29 DIAGNOSIS — F4321 Adjustment disorder with depressed mood: Secondary | ICD-10-CM | POA: Insufficient documentation

## 2019-07-29 DIAGNOSIS — M6281 Muscle weakness (generalized): Secondary | ICD-10-CM | POA: Diagnosis not present

## 2019-07-29 DIAGNOSIS — Z79899 Other long term (current) drug therapy: Secondary | ICD-10-CM | POA: Insufficient documentation

## 2019-07-29 DIAGNOSIS — E538 Deficiency of other specified B group vitamins: Secondary | ICD-10-CM | POA: Insufficient documentation

## 2019-07-29 DIAGNOSIS — R69 Illness, unspecified: Secondary | ICD-10-CM | POA: Diagnosis not present

## 2019-07-29 DIAGNOSIS — E039 Hypothyroidism, unspecified: Secondary | ICD-10-CM | POA: Insufficient documentation

## 2019-07-29 DIAGNOSIS — C719 Malignant neoplasm of brain, unspecified: Secondary | ICD-10-CM

## 2019-07-29 NOTE — Progress Notes (Signed)
[place] OFFICE PROGRESS NOTE   Diagnosis: Glioblastoma  INTERVAL HISTORY:   Mr. Finigan returns as scheduled.  He continues the monthly celldex vaccine at [facility].  He just received notice that the clinical trial will be ending and he does not yet know whether he will be able to continue the vaccine.  He feels well.  He has received the COVID-19 vaccine.  He continues to have weakness and coordination difficulty with the right arm and leg.  Stable right visual field deficit.  Objective:  Vital signs in last 24 hours:  Blood pressure 128/89, pulse 98, temperature 97.9 F (36.6 C), resp. rate 20, height _0  (1.727 m), weight 191 lb 6.4 oz (86.8 kg), SpO2 96 %.    Resp: Lungs clear bilaterally Cardio: Regular rate and rhythm Vascular: No leg edema Neuro: Alert, decreased strength and coordination at the right hand, decrease strength with dorsiflexion at the right foot   Medications: I have reviewed the patient's current medications.   Assessment/Plan: 1. 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 An MRI of the brain during a hospital admission on 03/27/2010 was compared to an MRI from [place] on 02/22/2010. There was an increase in the abnormal signal of the left posterior opercular region and periatrial region, concerning for progression of tumor. Review of the MRI by Dr. Ferdinand Lango at [facility] confirmed disease progression. Avastin was resumed on 05/28/2010. Hydroxyurea was initiated on 06/07/2010. The hydroxyurea was placed on hold on 06/27/2010 due to leukopenia and thrombocytopenia.  1. Initiation of salvage therapy with temozolomide and the Celldex vaccine beginning on 08/16/2010.  2. MRI of the brain at [facility] on 10/31/2010 is concerning for disease progression with an interval increase in enhancement surrounding the resection cavity and inferior margin of the putamen and left optic tract. 3. MRI of the brain at [facility] on 11/28/2010 showed a slight interval increase  in peripheral nodular enhancement surrounding the resection cavity 4. He underwent a repeat MRI of the brain at [facility] on 12/26/2010-this showed a stable appearance of the left temporal lobe resection cavity and marginal enhancement. 5. Restaging MRI of the brain 12/26/2010 at [facility] with a slight interval decrease in the size of residual left temporal lobe tumor 6. Restaging MRI of the brain at [facility] on 02/20/2011 revealed a slight interval decrease in the residual left temporal lobe tumor 7. Restaging MRI of the brain at [facility] on 04/03/2011 revealed stable disease. 8. MRI 06/12/2011 at [facility] with stable left temporal resection cavity with decreased peripheral enhancement 9. He completed a final cycle of temozolomide beginning on 06/27/11 and continues monthly vaccine therapy 10. PET scan on may 29th 2013 at [facility] definite foci of hypermetabolic FDG activity identified, in the region of the irregular nodular MRI enhancement in the inferior left temporal lobe there are no definite foci of hypermetabolic activity, there was low level FDG activity in this location similar to the surrounding nonenhancing left temporal lobe 11. Restaging MRI of the brain at [facility] on 11/06/2011-stable left brain resection site and? New less than 1 cm left brain lesion, no enhancement on a PET scan 12. Stable radiographic disease on an MRI 12/31/2011 13. Continued treatment with the Celldex vaccine with an MRI of the brain at [facility] on 09/09/2012 revealed overall stable disease aside from a slight increase in a nodular focus of enhancement the left anterior temporal lobe with central necrosis 14. MRI 12/02/2012 with increased enhancement in the anterior left temporal area 15. MRI guided left temporal resection 01/07/2013-negative for tumor  16. Continuation of treatment on the Celldex vaccine, treatment #32 on 01/27/2013 17. Restaging MRI at [facility] 03/08/2014 with no evidence of disease progression 18. Dose #46 of the Celldex vaccine on  03/08/2014 19. MRI 01/13/2018- stable disease 20. Dose #95  Celldex  vaccine 01/13/2018 21. MRI at [facility] 01/02/2015 without evidence of disease progression, dose #56 Celldex vaccine 22. MRI at [facility] 06/19/2015-stable,Celldex # 64 given 08/15/2015 23. MRI at [facility] 05/21/2016-increase in size of focal nodular enhancement at the left temporal resection cavity 24. MRI at [facility] 08/25/2018-stable 25. MRI brain at [facility] 05/11/2019-stable 2. 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 3. 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 Increase in the Keppra dose 09/09/2012 secondary to the possibility of visual seizures, Keppra dose increased 2017 secondary to episodes of aphasia-improved 5. Hypothyroidism, maintained on Synthroid. 6. History of Hypertension, essential hypertension and an effect of Avastin. Improved 7. History of nephrotic syndrome secondary to Avastin. 8. History of progressive thrombocytopenia, status post a bone marrow biopsy on 04/26/2010 with findings of a hypocellular marrow with decreased megakaryocytes and a left shift in the myeloid and erythroid series and minimal dyspoiesis. Cytogenetics returned normal. He again developed thrombocytopenia after receiving Avastin and beginning hydroxyurea in April 2012. The platelet count has improved since the Avastin and hydroxyurea were discontinued.  9. Vitamin B12 deficiency. 10. Acute diverticulitis in April 2012, improved with ciprofloxacin and Flagyl. Related to Avastin (?).  11. Adhesive capsulitis of the right shoulder, followed by Dr. Amedeo Plenty. He is no longer taking pain medication and the range of motion has improved after a physical therapy program.  12. 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 Low serum testosterone level on labs requested  by Dr. Ferdinand Lango in November 2012-he is not taking testosterone   14. Subdural hemorrhage after a fall requiring surgical evacuation in May 2015  15. Fall outside of his car 03/30/2014 with scalp abrasion/ecchymoses      Disposition: Mr. Marvin appears unchanged.  He remains in clinical remission from the glioblastoma.  He continues monthly Celldex vaccination at [facility].  He reports the clinical trial has been discontinued and he is unsure whether he will be able to continue vaccine treatment.  He is scheduled for an office visit with Dr. Ferdinand Lango next month.  He plans to schedule an orthopedics appointment for help with range of motion at the right shoulder.  He will return for an office visit here in 9 months.  Betsy Coder, MD  07/29/2019  12:41 PM

## 2019-07-29 NOTE — Telephone Encounter (Signed)
Scheduled appt per 6/10 los. ° °Printed calendar and avs. °

## 2019-08-18 DIAGNOSIS — Z125 Encounter for screening for malignant neoplasm of prostate: Secondary | ICD-10-CM | POA: Diagnosis not present

## 2019-08-18 DIAGNOSIS — C719 Malignant neoplasm of brain, unspecified: Secondary | ICD-10-CM | POA: Diagnosis not present

## 2019-08-18 DIAGNOSIS — R7301 Impaired fasting glucose: Secondary | ICD-10-CM | POA: Diagnosis not present

## 2019-08-18 DIAGNOSIS — Z0000 Encounter for general adult medical examination without abnormal findings: Secondary | ICD-10-CM | POA: Diagnosis not present

## 2019-08-18 DIAGNOSIS — G8191 Hemiplegia, unspecified affecting right dominant side: Secondary | ICD-10-CM | POA: Diagnosis not present

## 2019-08-18 DIAGNOSIS — G3184 Mild cognitive impairment, so stated: Secondary | ICD-10-CM | POA: Diagnosis not present

## 2019-08-18 DIAGNOSIS — Z1389 Encounter for screening for other disorder: Secondary | ICD-10-CM | POA: Diagnosis not present

## 2019-08-18 DIAGNOSIS — E7800 Pure hypercholesterolemia, unspecified: Secondary | ICD-10-CM | POA: Diagnosis not present

## 2019-08-18 DIAGNOSIS — E039 Hypothyroidism, unspecified: Secondary | ICD-10-CM | POA: Diagnosis not present

## 2019-08-18 DIAGNOSIS — Z1159 Encounter for screening for other viral diseases: Secondary | ICD-10-CM | POA: Diagnosis not present

## 2019-08-20 DIAGNOSIS — E7800 Pure hypercholesterolemia, unspecified: Secondary | ICD-10-CM | POA: Diagnosis not present

## 2019-08-31 DIAGNOSIS — C712 Malignant neoplasm of temporal lobe: Secondary | ICD-10-CM | POA: Diagnosis not present

## 2019-08-31 DIAGNOSIS — Z923 Personal history of irradiation: Secondary | ICD-10-CM | POA: Diagnosis not present

## 2019-08-31 DIAGNOSIS — Z79899 Other long term (current) drug therapy: Secondary | ICD-10-CM | POA: Diagnosis not present

## 2019-11-01 DIAGNOSIS — C712 Malignant neoplasm of temporal lobe: Secondary | ICD-10-CM | POA: Diagnosis not present

## 2019-11-01 DIAGNOSIS — C719 Malignant neoplasm of brain, unspecified: Secondary | ICD-10-CM | POA: Diagnosis not present

## 2019-11-01 DIAGNOSIS — Z23 Encounter for immunization: Secondary | ICD-10-CM | POA: Diagnosis not present

## 2020-01-17 ENCOUNTER — Ambulatory Visit
Admission: RE | Admit: 2020-01-17 | Discharge: 2020-01-17 | Disposition: A | Discharge status: Home / Self Care | Payer: Medicare HMO | Source: Ambulatory Visit | Attending: Physician Assistant | Admitting: Physician Assistant

## 2020-01-17 ENCOUNTER — Other Ambulatory Visit: Payer: Self-pay | Admitting: Physician Assistant

## 2020-01-17 DIAGNOSIS — R0789 Other chest pain: Secondary | ICD-10-CM | POA: Diagnosis not present

## 2020-01-17 DIAGNOSIS — R059 Cough, unspecified: Secondary | ICD-10-CM | POA: Diagnosis not present

## 2020-01-31 DIAGNOSIS — R252 Cramp and spasm: Secondary | ICD-10-CM | POA: Diagnosis not present

## 2020-01-31 DIAGNOSIS — Z85841 Personal history of malignant neoplasm of brain: Secondary | ICD-10-CM | POA: Diagnosis not present

## 2020-01-31 DIAGNOSIS — C712 Malignant neoplasm of temporal lobe: Secondary | ICD-10-CM | POA: Diagnosis not present

## 2020-01-31 DIAGNOSIS — Z08 Encounter for follow-up examination after completed treatment for malignant neoplasm: Secondary | ICD-10-CM | POA: Diagnosis not present

## 2020-01-31 DIAGNOSIS — R531 Weakness: Secondary | ICD-10-CM | POA: Diagnosis not present

## 2020-02-03 DIAGNOSIS — R058 Other specified cough: Secondary | ICD-10-CM | POA: Diagnosis not present

## 2020-04-24 ENCOUNTER — Other Ambulatory Visit: Payer: Self-pay

## 2020-04-24 ENCOUNTER — Other Ambulatory Visit: Payer: Self-pay | Admitting: *Deleted

## 2020-04-24 ENCOUNTER — Inpatient Hospital Stay: Payer: Medicare HMO | Attending: Oncology | Admitting: Oncology

## 2020-04-24 VITALS — BP 128/71 | HR 85 | Temp 96.7°F | Resp 20 | Ht 68.0 in | Wt 196.7 lb

## 2020-04-24 DIAGNOSIS — C719 Malignant neoplasm of brain, unspecified: Secondary | ICD-10-CM

## 2020-04-24 DIAGNOSIS — Z79899 Other long term (current) drug therapy: Secondary | ICD-10-CM | POA: Insufficient documentation

## 2020-04-24 DIAGNOSIS — E039 Hypothyroidism, unspecified: Secondary | ICD-10-CM | POA: Diagnosis not present

## 2020-04-24 DIAGNOSIS — Z85841 Personal history of malignant neoplasm of brain: Secondary | ICD-10-CM | POA: Diagnosis not present

## 2020-04-24 DIAGNOSIS — Z9221 Personal history of antineoplastic chemotherapy: Secondary | ICD-10-CM | POA: Insufficient documentation

## 2020-04-24 MED ORDER — VALACYCLOVIR HCL 1 G PO TABS: 1000.0000 mg | ORAL_TABLET | ORAL | 5 refills | Status: DC | PRN

## 2020-04-24 NOTE — Progress Notes (Signed)
[place] OFFICE PROGRESS NOTE   Diagnosis: Glioblastoma  INTERVAL HISTORY:   [name] returns for a scheduled visit.  He reports having a "cold "approximately 2 months ago.  He thinks he may have had a Covid infection.  He has received 2 doses of the COVID-19 vaccine.  No new neurologic symptoms.  He continues to have right-sided weakness.  He is followed by Dr. Ferdinand Thomas with an MRI every 3 months.  The Celldex was last given in July 2021.  He reports a stable right visual field deficit. He has intermittent "cold sores "at the lip relieved when he takes 2 doses of Valtrex. Objective:  Vital signs in last 24 hours:  Blood pressure 128/71, pulse 85, temperature (!) 96.7 F (35.9 C), temperature source Tympanic, resp. rate 20, height 5' 8"  (1.727 m), weight 196 lb 11.2 oz (89.2 kg), SpO2 98 %.     Resp: Lungs clear bilaterally Cardio: Regular rate and rhythm GI: No hepatosplenomegaly Vascular: No leg edema Neuro: Alert and oriented.  Decreased strength at the right arm and leg with 3/5 dorsi flexion at the right foot, 3-4/5 flexion at the right arm and right hand grip    Lab Results:  Lab Results  Component Value Date   WBC 4.1 06/13/2017   HGB 13.2 06/13/2017   HCT 38.7 (L) 06/13/2017   MCV 99.2 06/13/2017   PLT 187.0 06/13/2017   NEUTROABS 2.4 06/13/2017    CMP  Lab Results  Component Value Date   NA 135 (A) 06/18/2016   K 3.9 06/18/2016   CL 109 02/10/2013   CO2 23 02/10/2013   GLUCOSE 90 02/10/2013   BUN 17 06/18/2016   CREATININE 1.1 06/18/2016   CALCIUM 8.8 02/10/2013   PROT 6.8 02/10/2013   ALBUMIN 3.8 02/10/2013   AST 28 06/18/2016   ALT 28 06/18/2016   ALKPHOS 71 06/18/2016   BILITOT 0.6 02/10/2013   GFRNONAA >60 05/12/2010   GFRAA  05/12/2010    >60        The eGFR has been calculated using the MDRD equation. This calculation has not been validated in all clinical situations. eGFR's persistently <60 mL/min signify possible  Chronic Kidney Disease.    No results found for: CEA1  Lab Results  Component Value Date   INR 1.09 03/27/2010    Imaging:  No results found.  Medications: I have reviewed the patient's current medications.   Assessment/Plan: 1. 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 An MRI of the brain during a hospital admission on 03/27/2010 was compared to an MRI from [place] on 02/22/2010. There was an increase in the abnormal signal of the left posterior opercular region and periatrial region, concerning for progression of tumor. Review of the MRI by Dr. Ferdinand Thomas at [facility] confirmed disease progression. Avastin was resumed on 05/28/2010. Hydroxyurea was initiated on 06/07/2010. The hydroxyurea was placed on hold on 06/27/2010 due to leukopenia and thrombocytopenia.  1. Initiation of salvage therapy with temozolomide and the Celldex vaccine beginning on 08/16/2010.  2. MRI of the brain at [facility] on 10/31/2010 is concerning for disease progression with an interval increase in enhancement surrounding the resection cavity and inferior margin of the putamen and left optic tract. 3. MRI of the brain at [facility] on 11/28/2010 showed a slight interval increase in peripheral nodular enhancement surrounding the resection cavity 4. He underwent a repeat MRI of the brain at [facility] on 12/26/2010-this showed a stable appearance of the left temporal lobe resection cavity and marginal  enhancement. 5. Restaging MRI of the brain 12/26/2010 at [facility] with a slight interval decrease in the size of residual left temporal lobe tumor 6. Restaging MRI of the brain at [facility] on 02/20/2011 revealed a slight interval decrease in the residual left temporal lobe tumor 7. Restaging MRI of the brain at [facility] on 04/03/2011 revealed stable disease. 8. MRI 06/12/2011 at [facility] with stable left temporal resection cavity with decreased peripheral enhancement 9. He completed a final cycle of temozolomide beginning on 06/27/11 and continues  monthly vaccine therapy 10. PET scan on may 29th 2013 at [facility] definite foci of hypermetabolic FDG activity identified, in the region of the irregular nodular MRI enhancement in the inferior left temporal lobe there are no definite foci of hypermetabolic activity, there was low level FDG activity in this location similar to the surrounding nonenhancing left temporal lobe 11. Restaging MRI of the brain at [facility] on 11/06/2011-stable left brain resection site and? New less than 1 cm left brain lesion, no enhancement on a PET scan 12. Stable radiographic disease on an MRI 12/31/2011 13. Continued treatment with the Celldex vaccine with an MRI of the brain at [facility] on 09/09/2012 revealed overall stable disease aside from a slight increase in a nodular focus of enhancement the left anterior temporal lobe with central necrosis 14. MRI 12/02/2012 with increased enhancement in the anterior left temporal area 15. MRI guided left temporal resection 01/07/2013-negative for tumor 16. Continuation of treatment on the Celldex vaccine, treatment #32 on 01/27/2013 17. Restaging MRI at [facility] 03/08/2014 with no evidence of disease progression 18. Dose #46 of the Celldex vaccine on 03/08/2014 19. MRI 01/13/2018- stable disease 20. Dose #95  Celldex  vaccine 01/13/2018 21. MRI at [facility] 01/02/2015 without evidence of disease progression, dose #56 Celldex vaccine 22. MRI at [facility] 06/19/2015-stable,Celldex # 64 given 08/15/2015 23. MRI at [facility] 05/21/2016-increase in size of focal nodular enhancement at the left temporal resection cavity 24. MRI at [facility] 08/25/2018-stable 25. MRI brain at [facility] 05/11/2019-stable 26. 08/31/2019-end of Celldex vaccine, 116 total treatments given 27. MRI brain at [facility] 02/18/2020-no evidence of progressive disease 2. 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 3. 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 Increase in the Keppra dose 09/09/2012 secondary to the possibility of visual seizures, Keppra dose increased 2017 secondary to episodes of aphasia-improved 5. Hypothyroidism, maintained on Synthroid. 6. History of Hypertension, essential hypertension and an effect of Avastin. Improved 7. History of nephrotic syndrome secondary to Avastin. 8. History of progressive thrombocytopenia, status post a bone marrow biopsy on 04/26/2010 with findings of a hypocellular marrow with decreased megakaryocytes and a left shift in the myeloid and erythroid series and minimal dyspoiesis. Cytogenetics returned normal. He again developed thrombocytopenia after receiving Avastin and beginning hydroxyurea in April 2012. The platelet count has improved since the Avastin and hydroxyurea were discontinued.  9. Vitamin B12 deficiency. 10. Acute diverticulitis in April 2012, improved with ciprofloxacin and Flagyl. Related to Avastin (?).  11. Adhesive capsulitis of the right shoulder, followed by Dr. Amedeo Plenty. He is no longer taking pain medication and the range of motion has improved after a physical therapy program.  12. 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 Low serum testosterone level on labs requested by Dr. Ferdinand Thomas in November 2012-he is not taking testosterone   14. Subdural hemorrhage after a fall requiring surgical evacuation in May 2015  34. Fall outside of his car 03/30/2014 with scalp abrasion/ecchymoses   Disposition: [name] remains in clinical remission from the glioblastoma.  He continues clinical and radiologic follow-up with Dr. Ferdinand Thomas.  He is scheduled to see Dr. Ferdinand Thomas next week.  He will return for an office visit in 9 months.  I am available to see him sooner as needed.  Betsy Coder, MD  04/24/2020  11:29 AM

## 2020-05-02 DIAGNOSIS — C712 Malignant neoplasm of temporal lobe: Secondary | ICD-10-CM | POA: Diagnosis not present

## 2020-06-07 DIAGNOSIS — Z207 Contact with and (suspected) exposure to pediculosis, acariasis and other infestations: Secondary | ICD-10-CM | POA: Diagnosis not present

## 2020-06-07 DIAGNOSIS — L259 Unspecified contact dermatitis, unspecified cause: Secondary | ICD-10-CM | POA: Diagnosis not present

## 2020-08-01 DIAGNOSIS — C712 Malignant neoplasm of temporal lobe: Secondary | ICD-10-CM | POA: Diagnosis not present

## 2020-08-23 DIAGNOSIS — E039 Hypothyroidism, unspecified: Secondary | ICD-10-CM | POA: Diagnosis not present

## 2020-08-23 DIAGNOSIS — C719 Malignant neoplasm of brain, unspecified: Secondary | ICD-10-CM | POA: Diagnosis not present

## 2020-08-23 DIAGNOSIS — Z0000 Encounter for general adult medical examination without abnormal findings: Secondary | ICD-10-CM | POA: Diagnosis not present

## 2020-08-23 DIAGNOSIS — R7301 Impaired fasting glucose: Secondary | ICD-10-CM | POA: Diagnosis not present

## 2020-08-23 DIAGNOSIS — Z1389 Encounter for screening for other disorder: Secondary | ICD-10-CM | POA: Diagnosis not present

## 2020-08-23 DIAGNOSIS — Z125 Encounter for screening for malignant neoplasm of prostate: Secondary | ICD-10-CM | POA: Diagnosis not present

## 2020-08-23 DIAGNOSIS — E7800 Pure hypercholesterolemia, unspecified: Secondary | ICD-10-CM | POA: Diagnosis not present

## 2020-08-23 DIAGNOSIS — G8191 Hemiplegia, unspecified affecting right dominant side: Secondary | ICD-10-CM | POA: Diagnosis not present

## 2020-08-28 DIAGNOSIS — E039 Hypothyroidism, unspecified: Secondary | ICD-10-CM | POA: Diagnosis not present

## 2020-08-28 DIAGNOSIS — E7800 Pure hypercholesterolemia, unspecified: Secondary | ICD-10-CM | POA: Diagnosis not present

## 2020-08-28 DIAGNOSIS — R972 Elevated prostate specific antigen [PSA]: Secondary | ICD-10-CM | POA: Diagnosis not present

## 2020-08-28 DIAGNOSIS — Z125 Encounter for screening for malignant neoplasm of prostate: Secondary | ICD-10-CM | POA: Diagnosis not present

## 2020-08-28 DIAGNOSIS — R7301 Impaired fasting glucose: Secondary | ICD-10-CM | POA: Diagnosis not present

## 2020-11-14 DIAGNOSIS — C712 Malignant neoplasm of temporal lobe: Secondary | ICD-10-CM | POA: Diagnosis not present

## 2020-11-14 DIAGNOSIS — D496 Neoplasm of unspecified behavior of brain: Secondary | ICD-10-CM | POA: Diagnosis not present

## 2020-11-20 DIAGNOSIS — H00024 Hordeolum internum left upper eyelid: Secondary | ICD-10-CM | POA: Diagnosis not present

## 2020-12-04 DIAGNOSIS — R300 Dysuria: Secondary | ICD-10-CM | POA: Diagnosis not present

## 2020-12-05 ENCOUNTER — Telehealth: Payer: Self-pay | Admitting: Oncology

## 2020-12-05 NOTE — Telephone Encounter (Signed)
Returned VM to patient.  He stated he was having issues with frequent urination and was told by PCP to call Oncologist.  I spoke with Lenox Ponds, RN and she has advised me to inform patient that he should contact his Urologist.  He sees [facility].  I was able to provide him with the phone number to their office [phone].  He was very thankful for this information

## 2020-12-07 DIAGNOSIS — I6789 Other cerebrovascular disease: Secondary | ICD-10-CM | POA: Diagnosis not present

## 2020-12-07 DIAGNOSIS — R350 Frequency of micturition: Secondary | ICD-10-CM | POA: Diagnosis not present

## 2020-12-12 ENCOUNTER — Other Ambulatory Visit: Payer: Self-pay

## 2020-12-12 ENCOUNTER — Encounter: Payer: Self-pay | Admitting: Dermatology

## 2020-12-12 ENCOUNTER — Ambulatory Visit: Payer: Medicare HMO | Admitting: Dermatology

## 2020-12-12 DIAGNOSIS — L821 Other seborrheic keratosis: Secondary | ICD-10-CM

## 2020-12-12 DIAGNOSIS — Z1283 Encounter for screening for malignant neoplasm of skin: Secondary | ICD-10-CM

## 2020-12-12 DIAGNOSIS — L570 Actinic keratosis: Secondary | ICD-10-CM

## 2020-12-12 DIAGNOSIS — C44319 Basal cell carcinoma of skin of other parts of face: Secondary | ICD-10-CM

## 2020-12-12 DIAGNOSIS — D485 Neoplasm of uncertain behavior of skin: Secondary | ICD-10-CM

## 2020-12-12 DIAGNOSIS — C4491 Basal cell carcinoma of skin, unspecified: Secondary | ICD-10-CM

## 2020-12-12 HISTORY — DX: Basal cell carcinoma of skin, unspecified: C44.91

## 2020-12-12 NOTE — Patient Instructions (Signed)
   Get over the counter- Cerave Anti Itch- (promethazine)

## 2020-12-14 ENCOUNTER — Telehealth: Payer: Self-pay

## 2020-12-14 NOTE — Telephone Encounter (Signed)
Phone call to patient with his pathology results. Patient aware of results.  

## 2020-12-14 NOTE — Telephone Encounter (Signed)
-----   Message from Lavonna Monarch, MD sent at 12/14/2020  5:59 AM EDT ----- Schedule surgery with Dr. Darene Lamer

## 2020-12-27 ENCOUNTER — Encounter: Payer: Self-pay | Admitting: Dermatology

## 2020-12-27 NOTE — Progress Notes (Signed)
   Follow-Up Visit   Subjective  [name] is a 60 y.o. male who presents for the following: Annual Exam (Right neck- scaly).  Several areas to check, general skin examination Location:  Duration:  Quality:  Associated Signs/Symptoms: Modifying Factors:  Severity:  Timing: Context:   Objective  Well appearing patient in no apparent distress; mood and affect are within normal limits. Torso - Posterior (Back) Full body skin examination: No atypical pigmented lesions.  1 probable nonmelanoma skin cancer left temple will be biopsied.  Chest - Medial (Center) Owens Shark textured 7 mm flattopped papule, dermoscopy compatible  Left Temple 8 mm pearly pink papule, BCC       Right Buccal Cheek Gritty 29mm pink crust    A full examination was performed including scalp, head, eyes, ears, nose, lips, neck, chest, axillae, abdomen, back, buttocks, bilateral upper extremities, bilateral lower extremities, hands, feet, fingers, toes, fingernails, and toenails. All findings within normal limits unless otherwise noted below.   Assessment & Plan    Encounter for screening for malignant neoplasm of skin Torso - Posterior (Back)  Annual skin examination.  Seborrheic keratosis Chest - Medial (Center)  Leave if stable  Neoplasm of uncertain behavior of skin Left Temple  Skin / nail biopsy Type of biopsy: tangential   Informed consent: discussed and consent obtained   Timeout: patient name, date of birth, surgical site, and procedure verified   Procedure prep:  Patient was prepped and draped in usual sterile fashion (Non sterile) Prep type:  Chlorhexidine Anesthesia: the lesion was anesthetized in a standard fashion   Anesthetic:  1% lidocaine w/ epinephrine 1-100,000 local infiltration Instrument used: flexible razor blade   Outcome: patient tolerated procedure well   Post-procedure details: wound care instructions given    Specimen 1 - Surgical pathology Differential  Diagnosis: bcc vs scc  Check Margins: No  AK (actinic keratosis) Right Buccal Cheek  Destruction of lesion - Right Buccal Cheek Complexity: simple   Destruction method: cryotherapy   Informed consent: discussed and consent obtained   Timeout:  patient name, date of birth, surgical site, and procedure verified Lesion destroyed using liquid nitrogen: Yes   Cryotherapy cycles:  3 Outcome: patient tolerated procedure well with no complications        I, Lavonna Monarch, MD, have reviewed all documentation for this visit.  The documentation on 12/27/20 for the exam, diagnosis, procedures, and orders are all accurate and complete.

## 2020-12-28 ENCOUNTER — Ambulatory Visit (INDEPENDENT_AMBULATORY_CARE_PROVIDER_SITE_OTHER): Payer: Medicare HMO | Admitting: Dermatology

## 2020-12-28 ENCOUNTER — Encounter: Payer: Self-pay | Admitting: Dermatology

## 2020-12-28 ENCOUNTER — Other Ambulatory Visit: Payer: Self-pay

## 2020-12-28 DIAGNOSIS — C44310 Basal cell carcinoma of skin of unspecified parts of face: Secondary | ICD-10-CM

## 2020-12-28 DIAGNOSIS — L98499 Non-pressure chronic ulcer of skin of other sites with unspecified severity: Secondary | ICD-10-CM | POA: Diagnosis not present

## 2020-12-28 DIAGNOSIS — C44319 Basal cell carcinoma of skin of other parts of face: Secondary | ICD-10-CM | POA: Diagnosis not present

## 2020-12-28 DIAGNOSIS — C4441 Basal cell carcinoma of skin of scalp and neck: Secondary | ICD-10-CM

## 2020-12-28 NOTE — Patient Instructions (Signed)

## 2021-01-04 ENCOUNTER — Ambulatory Visit (INDEPENDENT_AMBULATORY_CARE_PROVIDER_SITE_OTHER): Payer: Medicare HMO

## 2021-01-04 ENCOUNTER — Other Ambulatory Visit: Payer: Self-pay

## 2021-01-04 ENCOUNTER — Encounter: Payer: Medicare HMO | Admitting: Dermatology

## 2021-01-04 DIAGNOSIS — Z4802 Encounter for removal of sutures: Secondary | ICD-10-CM

## 2021-01-04 NOTE — Progress Notes (Signed)
Suture removal and path to patient no signs or symptoms of infection

## 2021-01-20 ENCOUNTER — Encounter: Payer: Self-pay | Admitting: Dermatology

## 2021-01-20 NOTE — Progress Notes (Addendum)
   Follow-Up Visit   Subjective  [name] is a 60 y.o. male who presents for the following: Procedure (LEFT TEMPLE).  Skin cancer left temple Location:  Duration:  Quality:  Associated Signs/Symptoms: Modifying Factors:  Severity:  Timing: Context:   Objective  Well appearing patient in no apparent distress; mood and affect are within normal limits. Left Temple Lesion identified by nurse and Dr.Samaiya Awadallah in room.    A focused examination was performed including head and neck. Relevant physical exam findings are noted in the Assessment and Plan.   Assessment & Plan    Basal cell carcinoma (BCC) of face Left Temple  Skin excision  Lesion length (cm):  0.9 Lesion width (cm):  0.9 Margin per side (cm):  0.1 Total excision diameter (cm):  1.1 Informed consent: discussed and consent obtained   Timeout: patient name, date of birth, surgical site, and procedure verified   Anesthesia: the lesion was anesthetized in a standard fashion   Anesthetic:  1% lidocaine w/ epinephrine 1-100,000 local infiltration Instrument used: #15 blade   Hemostasis achieved with: pressure and electrodesiccation   Outcome: patient tolerated procedure well with no complications   Post-procedure details: sterile dressing applied and wound care instructions given   Dressing type: bandage, petrolatum and pressure dressing    Destruction of lesion Complexity: simple   Destruction method: electrodesiccation and curettage   Informed consent: discussed and consent obtained   Timeout:  patient name, date of birth, surgical site, and procedure verified Anesthesia: the lesion was anesthetized in a standard fashion   Anesthetic:  1% lidocaine w/ epinephrine 1-100,000 local infiltration Curettage performed in three different directions: Yes   Curettage cycles:  3 Lesion length (cm):  0.9 Lesion width (cm):  0.9 Margin per side (cm):  0.3 Final wound size (cm):  1.5 Hemostasis achieved with:  ferric  subsulfate Outcome: patient tolerated procedure well with no complications    Skin repair Complexity:  Intermediate Final length (cm):  1.5 Informed consent: discussed and consent obtained   Timeout: patient name, date of birth, surgical site, and procedure verified   Procedure prep:  Patient was prepped and draped in usual sterile fashion (non sterile) Prep type:  Chlorhexidine Anesthesia: the lesion was anesthetized in a standard fashion   Undermining: edges undermined   Fine/surface layer approximation (top stitches):  Suture size:  5-0 Suture type: Vicryl (polyglactin 910) and nylon   Suture type comment:  Nylon Stitches: simple running   Suture removal (days):  7 Hemostasis achieved with: suture, pressure and electrodesiccation Outcome: patient tolerated procedure well with no complications   Post-procedure details: wound care instructions given   Post-procedure details comment:  Non sterile pressure  Additional details:  5-0-nylon x 3  Specimen 1 - Surgical pathology Differential Diagnosis: bcc nodular  Check Margins: superior margin  Curettage showed this to be a deep lesion so after cautery of the base and margins, narrow margin excision followed by layered closure was performed.     I, Lavonna Monarch, MD, have reviewed all documentation for this visit.  The documentation on 01/31/21 for the exam, diagnosis, procedures, and orders are all accurate and complete.

## 2021-01-24 ENCOUNTER — Inpatient Hospital Stay: Payer: Medicare HMO | Attending: Oncology | Admitting: Oncology

## 2021-01-24 ENCOUNTER — Other Ambulatory Visit: Payer: Self-pay

## 2021-01-24 ENCOUNTER — Inpatient Hospital Stay: Payer: Medicare HMO

## 2021-01-24 VITALS — BP 125/90 | HR 86 | Temp 97.8°F | Resp 20 | Ht 68.0 in | Wt 192.4 lb

## 2021-01-24 DIAGNOSIS — Z23 Encounter for immunization: Secondary | ICD-10-CM | POA: Diagnosis not present

## 2021-01-24 DIAGNOSIS — C719 Malignant neoplasm of brain, unspecified: Secondary | ICD-10-CM | POA: Insufficient documentation

## 2021-01-24 MED ORDER — INFLUENZA VAC SPLIT QUAD 0.5 ML IM SUSY
0.5000 mL | PREFILLED_SYRINGE | Freq: Once | INTRAMUSCULAR | Status: AC
  Administered 2021-01-24: 0.5 mL via INTRAMUSCULAR

## 2021-01-24 NOTE — Progress Notes (Signed)
[place] OFFICE PROGRESS NOTE   Diagnosis: Glioblastoma multiforme  INTERVAL HISTORY:   Mr. Kantz returns as scheduled.  He feels well.  No change in the right sided weakness and visual field deficit.  He has an occasional partial seizure characterized by a visual disturbance.  He takes an extra Keppra when this occurs.  He last saw Dr. Ferdinand Lango in September.  An MRI on 11/14/2020 revealed no evidence of disease progression.   Objective:  Vital signs in last 24 hours:  Blood pressure 125/90, pulse 86, temperature 97.8 F (36.6 C), resp. rate 20, height _0  (1.727 m), weight 192 lb 6.4 oz (87.3 kg), SpO2 100 %.    Resp: Lungs clear bilaterally Cardio: Regular rate and rhythm GI: No hepatosplenomegaly Vascular: No leg edema Neuro: Mild weakness of the right arm and leg    Lab Results:  Lab Results  Component Value Date   WBC 4.1 06/13/2017   HGB 13.2 06/13/2017   HCT 38.7 (L) 06/13/2017   MCV 99.2 06/13/2017   PLT 187.0 06/13/2017   NEUTROABS 2.4 06/13/2017    CMP  Lab Results  Component Value Date   NA 135 (A) 06/18/2016   K 3.9 06/18/2016   CL 109 02/10/2013   CO2 23 02/10/2013   GLUCOSE 90 02/10/2013   BUN 17 06/18/2016   CREATININE 1.1 06/18/2016   CALCIUM 8.8 02/10/2013   PROT 6.8 02/10/2013   ALBUMIN 3.8 02/10/2013   AST 28 06/18/2016   ALT 28 06/18/2016   ALKPHOS 71 06/18/2016   BILITOT 0.6 02/10/2013   GFRNONAA >60 05/12/2010   GFRAA  05/12/2010    >60        The eGFR has been calculated using the MDRD equation. This calculation has not been validated in all clinical situations. eGFR's persistently <60 mL/min signify possible Chronic Kidney Disease.     Medications: I have reviewed the patient's current medications.   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 An MRI of the brain during a hospital admission on 03/27/2010 was compared to an MRI from [facility] on 02/22/2010. There was an increase in  the abnormal signal of the left posterior opercular region and periatrial region, concerning for progression of tumor. Review of the MRI by Dr. Ferdinand Lango at [facility] confirmed disease progression. Avastin was resumed on 05/28/2010. Hydroxyurea was initiated on 06/07/2010. The hydroxyurea was placed on hold on 06/27/2010 due to leukopenia and thrombocytopenia.   Initiation of salvage therapy with temozolomide and the Celldex vaccine beginning on 08/16/2010.   MRI of the brain at [facility] on 10/31/2010 is concerning for disease progression with an interval increase in enhancement surrounding the resection cavity and inferior margin of the putamen and left optic tract. MRI of the brain at [facility] on 11/28/2010 showed a slight interval increase in peripheral nodular enhancement surrounding the resection cavity He underwent a repeat MRI of the brain at [facility] on 12/26/2010-this showed a stable appearance of the left temporal lobe resection cavity and marginal enhancement. Restaging MRI of the brain 12/26/2010 at [facility] with a slight interval decrease in the size of residual left temporal lobe tumor Restaging MRI of the brain at [facility] on 02/20/2011 revealed a slight interval decrease in the residual left temporal lobe tumor Restaging MRI of the brain at [facility] on 04/03/2011 revealed stable disease. MRI 06/12/2011 at [facility] with stable left temporal resection cavity with decreased peripheral enhancement He completed a final cycle of temozolomide beginning on 06/27/11 and continues monthly vaccine therapy PET scan  on may 29th 2013 at [facility] definite foci of hypermetabolic FDG activity identified, in the region of the irregular nodular MRI enhancement in the inferior left temporal lobe there are no definite foci of hypermetabolic activity, there was low level FDG activity in this location similar to the surrounding nonenhancing left temporal lobe Restaging MRI of the brain at [facility] on 11/06/2011-stable left brain resection site and? New  less than 1 cm left brain lesion, no enhancement on a PET scan Stable radiographic disease on an MRI 12/31/2011 Continued treatment with the Celldex vaccine with an MRI of the brain at [facility] on 09/09/2012 revealed overall stable disease aside from a slight increase in a nodular focus of enhancement the left anterior temporal lobe with central necrosis MRI 12/02/2012 with increased enhancement in the anterior left temporal area MRI guided left temporal resection 01/07/2013-negative for tumor Continuation of treatment on the Celldex vaccine, treatment #32 on 01/27/2013 Restaging MRI at [facility] 03/08/2014 with no evidence of disease progression Dose #46 of the Celldex vaccine on 03/08/2014 MRI 01/13/2018- stable disease Dose #95  Celldex  vaccine 01/13/2018 MRI at [facility] 01/02/2015 without evidence of disease progression, dose #56 Celldex vaccine MRI at [facility] 06/19/2015-stable,Celldex # 64 given 08/15/2015 MRI at [facility] 05/21/2016-increase in size of focal nodular enhancement at the left temporal resection cavity MRI at [facility] 08/25/2018-stable MRI brain at [facility] 05/11/2019-stable 08/31/2019-end of Celldex vaccine, 116 total treatments given MRI brain at 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 Right visual field deficit secondary to progression of the glioblastoma multiforme, stable History of generalized seizures, maintained on Keppra. Increase in the Keppra dose 09/09/2012 secondary to the possibility of visual seizures, Keppra dose increased 2017 secondary to episodes of aphasia-improved Hypothyroidism, maintained on Synthroid. History of Hypertension, essential hypertension and an effect of Avastin. Improved History of nephrotic syndrome secondary to Avastin. History of progressive thrombocytopenia, status post a bone marrow biopsy on 04/26/2010 with findings of a  hypocellular marrow with decreased megakaryocytes and a left shift in the myeloid and erythroid series and minimal dyspoiesis. Cytogenetics returned normal. He again developed thrombocytopenia after receiving Avastin and beginning hydroxyurea in April 2012. The platelet count has improved since the Avastin and hydroxyurea were discontinued.   Vitamin B12 deficiency. Acute diverticulitis in April 2012, improved with ciprofloxacin and Flagyl. Related to Avastin (?).   Adhesive capsulitis of the right shoulder, followed by Dr. Amedeo Plenty. He is no longer taking pain medication and the range of motion has improved after a physical therapy program.   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 Low serum testosterone level on labs requested by Dr. Ferdinand Lango in November 2012-he is not taking testosterone         14. Subdural hemorrhage after a fall requiring surgical evacuation in May 2015       15. Fall outside of his car 03/30/2014 with scalp abrasion/ecchymoses      Disposition: Mr. Poser is in clinical remission from the glioblastoma multiforme.  He continues frequent follow-up with Dr. Ferdinand Lango at [facility]  He is scheduled for an office visit and surveillance imaging next month.  He will return for an office visit in 9 months.  We are available to see him sooner as needed.  He received an influenza vaccine today.  Betsy Coder, MD  01/24/2021  12:19 PM

## 2021-01-26 ENCOUNTER — Encounter: Payer: Self-pay | Admitting: *Deleted

## 2021-01-26 NOTE — Progress Notes (Addendum)
Faxed referral order, demographics and chart information to [facility]--att: Dr. Lavone Orn [phone]. Received call from [facility] that Dr. Lavone Orn will retire 10/2021 and is not accepting new patients.

## 2021-01-30 ENCOUNTER — Encounter: Payer: Self-pay | Admitting: *Deleted

## 2021-01-30 NOTE — Progress Notes (Signed)
Received message from Dr. Lavone Orn that he will retire in 2023 and will reassign him to another provider in his group. Patient notified.

## 2021-02-08 ENCOUNTER — Encounter: Payer: Medicare HMO | Admitting: Dermatology

## 2021-02-20 DIAGNOSIS — Z9221 Personal history of antineoplastic chemotherapy: Secondary | ICD-10-CM | POA: Diagnosis not present

## 2021-02-20 DIAGNOSIS — C712 Malignant neoplasm of temporal lobe: Secondary | ICD-10-CM | POA: Diagnosis not present

## 2021-02-20 DIAGNOSIS — H5347 Heteronymous bilateral field defects: Secondary | ICD-10-CM | POA: Diagnosis not present

## 2021-02-20 DIAGNOSIS — Z79899 Other long term (current) drug therapy: Secondary | ICD-10-CM | POA: Diagnosis not present

## 2021-02-20 DIAGNOSIS — R569 Unspecified convulsions: Secondary | ICD-10-CM | POA: Diagnosis not present

## 2021-02-20 DIAGNOSIS — R531 Weakness: Secondary | ICD-10-CM | POA: Diagnosis not present

## 2021-02-20 DIAGNOSIS — Z923 Personal history of irradiation: Secondary | ICD-10-CM | POA: Diagnosis not present

## 2021-03-07 DIAGNOSIS — E039 Hypothyroidism, unspecified: Secondary | ICD-10-CM | POA: Diagnosis not present

## 2021-03-27 ENCOUNTER — Other Ambulatory Visit: Payer: Self-pay | Admitting: Oncology

## 2021-05-22 DIAGNOSIS — C719 Malignant neoplasm of brain, unspecified: Secondary | ICD-10-CM | POA: Diagnosis not present

## 2021-05-22 DIAGNOSIS — C712 Malignant neoplasm of temporal lobe: Secondary | ICD-10-CM | POA: Diagnosis not present

## 2021-07-03 ENCOUNTER — Encounter: Payer: Self-pay | Admitting: Dermatology

## 2021-07-03 ENCOUNTER — Ambulatory Visit: Payer: Medicare Other | Admitting: Dermatology

## 2021-07-03 DIAGNOSIS — Z1283 Encounter for screening for malignant neoplasm of skin: Secondary | ICD-10-CM

## 2021-07-03 DIAGNOSIS — L821 Other seborrheic keratosis: Secondary | ICD-10-CM

## 2021-07-03 DIAGNOSIS — D225 Melanocytic nevi of trunk: Secondary | ICD-10-CM | POA: Diagnosis not present

## 2021-07-03 DIAGNOSIS — D229 Melanocytic nevi, unspecified: Secondary | ICD-10-CM

## 2021-07-03 DIAGNOSIS — Z85828 Personal history of other malignant neoplasm of skin: Secondary | ICD-10-CM

## 2021-07-24 ENCOUNTER — Encounter: Payer: Self-pay | Admitting: Dermatology

## 2021-07-24 NOTE — Progress Notes (Signed)
   Follow-Up Visit   Subjective  [name] is a 61 y.o. male who presents for the following: Follow-up (Patient here today for 6 month follow up, patient had Bailey left temple treated. Per patient he would like a few places on his face rechecked. ).  Check site where skin cancer was removed left temple, check moles on torso Location:  Duration:  Quality:  Associated Signs/Symptoms: Modifying Factors:  Severity:  Timing: Context:   Objective  Well appearing patient in no apparent distress; mood and affect are within normal limits. Waist Up No atypical nevi or signs of NMSC noted at the time of the visit.  All pigmented spots checked with dermoscopy.  Left Temporal Scalp Textured tan flattopped 7 mm papule, not contiguous with side of recent carcinoma.  Left Forehead No sign residual skin cancer    All skin waist up examined.   Assessment & Plan    Seborrheic keratosis Left Temporal Scalp  Leave if stable  Personal history of skin cancer Left Forehead  Check as needed clinical change  Numerous moles Waist Up  Yearly skin check.       I, Lavonna Monarch, MD, have reviewed all documentation for this visit.  The documentation on 07/24/21 for the exam, diagnosis, procedures, and orders are all accurate and complete.

## 2021-08-08 DIAGNOSIS — N3281 Overactive bladder: Secondary | ICD-10-CM | POA: Diagnosis not present

## 2021-08-08 DIAGNOSIS — R350 Frequency of micturition: Secondary | ICD-10-CM | POA: Diagnosis not present

## 2021-08-27 DIAGNOSIS — C719 Malignant neoplasm of brain, unspecified: Secondary | ICD-10-CM | POA: Diagnosis not present

## 2021-08-27 DIAGNOSIS — C760 Malignant neoplasm of head, face and neck: Secondary | ICD-10-CM | POA: Diagnosis not present

## 2021-08-27 DIAGNOSIS — C712 Malignant neoplasm of temporal lobe: Secondary | ICD-10-CM | POA: Diagnosis not present

## 2021-09-17 DIAGNOSIS — I6789 Other cerebrovascular disease: Secondary | ICD-10-CM | POA: Diagnosis not present

## 2021-09-17 DIAGNOSIS — R350 Frequency of micturition: Secondary | ICD-10-CM | POA: Diagnosis not present

## 2021-09-17 DIAGNOSIS — N3281 Overactive bladder: Secondary | ICD-10-CM | POA: Diagnosis not present

## 2021-09-18 DIAGNOSIS — E7800 Pure hypercholesterolemia, unspecified: Secondary | ICD-10-CM | POA: Diagnosis not present

## 2021-09-18 DIAGNOSIS — E039 Hypothyroidism, unspecified: Secondary | ICD-10-CM | POA: Diagnosis not present

## 2021-09-18 DIAGNOSIS — G8191 Hemiplegia, unspecified affecting right dominant side: Secondary | ICD-10-CM | POA: Diagnosis not present

## 2021-09-18 DIAGNOSIS — C719 Malignant neoplasm of brain, unspecified: Secondary | ICD-10-CM | POA: Diagnosis not present

## 2021-09-18 DIAGNOSIS — R7301 Impaired fasting glucose: Secondary | ICD-10-CM | POA: Diagnosis not present

## 2021-09-18 DIAGNOSIS — Z0000 Encounter for general adult medical examination without abnormal findings: Secondary | ICD-10-CM | POA: Diagnosis not present

## 2021-09-18 DIAGNOSIS — G3184 Mild cognitive impairment, so stated: Secondary | ICD-10-CM | POA: Diagnosis not present

## 2021-10-01 DIAGNOSIS — L03116 Cellulitis of left lower limb: Secondary | ICD-10-CM | POA: Diagnosis not present

## 2021-10-24 ENCOUNTER — Inpatient Hospital Stay: Payer: Medicare Other | Attending: Oncology | Admitting: Oncology

## 2021-10-24 VITALS — BP 118/84 | HR 73 | Temp 98.1°F | Resp 16 | Ht 68.0 in | Wt 189.8 lb

## 2021-10-24 DIAGNOSIS — C719 Malignant neoplasm of brain, unspecified: Secondary | ICD-10-CM | POA: Diagnosis not present

## 2021-10-24 DIAGNOSIS — Z85841 Personal history of malignant neoplasm of brain: Secondary | ICD-10-CM | POA: Insufficient documentation

## 2021-10-24 NOTE — Progress Notes (Signed)
[place] OFFICE PROGRESS NOTE   Diagnosis: GBM  INTERVAL HISTORY:   Mr. Mozingo returns as scheduled.  He feels well.  He reports stable right-sided weakness/lack of coordination and right visual field deficit.  No seizures.  He was placed on vibegronfor frequent urination.  This improved.  He continues every 78-monthfollow-up with Dr. PFerdinand Lango  Objective:  Vital signs in last 24 hours:  Blood pressure 118/84, pulse 73, temperature 98.1 F (36.7 C), temperature source Oral, resp. rate 16, height 5' 8"  (1.727 m), weight 189 lb 12.8 oz (86.1 kg), SpO2 95 %.    Resp: Lungs clear bilaterally Cardio: Regular rate and rhythm GI: No hepatosplenomegaly Vascular: No leg edema Neuro: Alert, difficulty with word finding, right arm/hand weakness, 4/5 strength at the right leg, right visual field deficit  Lab Results:  Lab Results  Component Value Date   WBC 4.1 06/13/2017   HGB 13.2 06/13/2017   HCT 38.7 (L) 06/13/2017   MCV 99.2 06/13/2017   PLT 187.0 06/13/2017   NEUTROABS 2.4 06/13/2017    CMP  Lab Results  Component Value Date   NA 135 (A) 06/18/2016   K 3.9 06/18/2016   CL 109 02/10/2013   CO2 23 02/10/2013   GLUCOSE 90 02/10/2013   BUN 17 06/18/2016   CREATININE 1.1 06/18/2016   CALCIUM 8.8 02/10/2013   PROT 6.8 02/10/2013   ALBUMIN 3.8 02/10/2013   AST 28 06/18/2016   ALT 28 06/18/2016   ALKPHOS 71 06/18/2016   BILITOT 0.6 02/10/2013   GFRNONAA >60 05/12/2010   GFRAA  05/12/2010    >60        The eGFR has been calculated using the MDRD equation. This calculation has not been validated in all clinical situations. eGFR's persistently <60 mL/min signify possible Chronic Kidney Disease.    Medications: I have reviewed the patient's current medications.   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 An MRI of the brain during a hospital admission on 03/27/2010 [facility] There was  an increase in the abnormal signal of the left posterior opercular region and periatrial region, concerning for progression of tumor. [facility]. Avastin was resumed on 05/28/2010. Hydroxyurea was initiated on 06/07/2010. The hydroxyurea was placed on hold on 06/27/2010 due to leukopenia and thrombocytopenia.   Initiation of salvage therapy with temozolomide and the Celldex vaccine beginning on 08/16/2010.   [facility] is concerning for disease progression with an interval increase in enhancement surrounding the resection cavity and inferior margin of the putamen and left optic tract. [facility] showed a slight interval increase in peripheral nodular enhancement surrounding the resection cavity [facility] showed a stable appearance of the left temporal lobe resection cavity and marginal enhancement. [facility] in the size of residual left temporal lobe tumor [facility] revealed a slight interval decrease in the residual left temporal lobe tumor [facility] revealed stable disease. [facility] with decreased peripheral enhancement He completed a final cycle of temozolomide beginning on 06/27/11 and continues monthly vaccine therapy [facility]  of hypermetabolic FDG activity identified, in the region of the irregular nodular MRI enhancement in the inferior left temporal lobe there are no definite foci of hypermetabolic activity, there was low level FDG activity in this location similar to the surrounding nonenhancing left temporal lobe Restaging MRI of the brain at [facility] on 11/06/2011-stable left brain resection  site and? New less than 1 cm left brain lesion, no enhancement on a PET scan Stable radiographic disease on an MRI 12/31/2011 Continued treatment with the Celldex vaccine with an MRI of the brain at [facility] on 09/09/2012 revealed overall stable disease aside from a slight increase in a nodular focus of enhancement the left anterior temporal lobe with central necrosis MRI 12/02/2012 with increased enhancement in the anterior left temporal area MRI guided left temporal resection 01/07/2013-negative for tumor Continuation of treatment on the Celldex vaccine, treatment #32 on 01/27/2013 Restaging MRI at [facility] 03/08/2014 with no evidence of disease progression Dose #46 of the Celldex vaccine on 03/08/2014 MRI 01/13/2018- stable disease Dose #95  Celldex  vaccine 01/13/2018 MRI at [facility] 01/02/2015 without evidence of disease progression, dose #56 Celldex vaccine MRI at [facility] 06/19/2015-stable,Celldex # 64 given 08/15/2015 MRI at [facility] 05/21/2016-increase in size of focal nodular enhancement at the left temporal resection cavity MRI at [facility] 08/25/2018-stable MRI brain at [facility] 05/11/2019-stable 08/31/2019-end of Celldex vaccine, 116 total treatments given MRI brain at [facility] 02/18/2020-no evidence of progressive disease [facility] brain [facility] 08/27/2021-stable enhancement of the rim of the left brain resection cavity and stable areas of parenchymal enhancement in the left temporal lobe 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 Right visual field deficit secondary to progression of the glioblastoma multiforme, stable History of generalized seizures, maintained on Keppra. Increase in the Keppra dose 09/09/2012 secondary to the possibility of visual seizures, Keppra dose increased 2017 secondary to episodes of aphasia-improved Hypothyroidism, maintained on Synthroid. History of Hypertension, essential hypertension and an effect of Avastin.  Improved History of nephrotic syndrome secondary to Avastin. History of progressive thrombocytopenia, status post a bone marrow biopsy on 04/26/2010 with findings of a hypocellular marrow with decreased megakaryocytes and a left shift in the myeloid and erythroid series and minimal dyspoiesis. Cytogenetics returned normal. He again developed thrombocytopenia after receiving Avastin and beginning hydroxyurea in April 2012. The platelet count has improved since the Avastin and hydroxyurea were discontinued.   Vitamin B12 deficiency. Acute diverticulitis in April 2012, improved with ciprofloxacin and Flagyl. Related to Avastin (?).   Adhesive capsulitis of the right shoulder, followed by Dr. Amedeo Plenty. He is no longer taking pain medication and the range of motion has improved after a physical therapy program.   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 Low serum testosterone level on labs requested by Dr. Ferdinand Lango in November 2012-he is not taking testosterone         14. Subdural hemorrhage after a fall requiring surgical evacuation in May 2015       15. Fall outside of his car 03/30/2014 with scalp abrasion/ecchymoses       Disposition: Mr. Charters remains in clinical remission from the glioblastoma.  He continues clinical and radiologic follow-up with Dr. Ferdinand Lango at [facility]  He will return for an office visit in 2 months.  I am available to see him sooner as needed.  Betsy Coder, MD  10/24/2021  12:14 PM

## 2021-12-11 DIAGNOSIS — C719 Malignant neoplasm of brain, unspecified: Secondary | ICD-10-CM | POA: Diagnosis not present

## 2021-12-11 DIAGNOSIS — C712 Malignant neoplasm of temporal lobe: Secondary | ICD-10-CM | POA: Diagnosis not present

## 2021-12-11 DIAGNOSIS — G8111 Spastic hemiplegia affecting right dominant side: Secondary | ICD-10-CM | POA: Diagnosis not present

## 2021-12-11 DIAGNOSIS — Z79899 Other long term (current) drug therapy: Secondary | ICD-10-CM | POA: Diagnosis not present

## 2021-12-11 DIAGNOSIS — Z7982 Long term (current) use of aspirin: Secondary | ICD-10-CM | POA: Diagnosis not present

## 2021-12-11 DIAGNOSIS — Z23 Encounter for immunization: Secondary | ICD-10-CM | POA: Diagnosis not present

## 2021-12-12 ENCOUNTER — Other Ambulatory Visit: Payer: Self-pay | Admitting: Physician Assistant

## 2021-12-12 ENCOUNTER — Ambulatory Visit
Admission: RE | Admit: 2021-12-12 | Discharge: 2021-12-12 | Disposition: A | Discharge status: Home / Self Care | Payer: Medicare Other | Source: Ambulatory Visit | Attending: Physician Assistant | Admitting: Physician Assistant

## 2021-12-12 DIAGNOSIS — W19XXXA Unspecified fall, initial encounter: Secondary | ICD-10-CM

## 2021-12-12 DIAGNOSIS — R0781 Pleurodynia: Secondary | ICD-10-CM | POA: Diagnosis not present

## 2021-12-12 DIAGNOSIS — S2241XA Multiple fractures of ribs, right side, initial encounter for closed fracture: Secondary | ICD-10-CM | POA: Diagnosis not present

## 2021-12-14 DIAGNOSIS — L03115 Cellulitis of right lower limb: Secondary | ICD-10-CM | POA: Diagnosis not present

## 2022-01-16 DIAGNOSIS — R29898 Other symptoms and signs involving the musculoskeletal system: Secondary | ICD-10-CM | POA: Diagnosis not present

## 2022-01-16 DIAGNOSIS — R413 Other amnesia: Secondary | ICD-10-CM | POA: Diagnosis not present

## 2022-02-20 DIAGNOSIS — R41844 Frontal lobe and executive function deficit: Secondary | ICD-10-CM | POA: Diagnosis not present

## 2022-02-20 DIAGNOSIS — R29898 Other symptoms and signs involving the musculoskeletal system: Secondary | ICD-10-CM | POA: Diagnosis not present

## 2022-02-20 DIAGNOSIS — R413 Other amnesia: Secondary | ICD-10-CM | POA: Diagnosis not present

## 2022-02-28 DIAGNOSIS — N3281 Overactive bladder: Secondary | ICD-10-CM | POA: Diagnosis not present

## 2022-02-28 DIAGNOSIS — R69 Illness, unspecified: Secondary | ICD-10-CM | POA: Diagnosis not present

## 2022-02-28 DIAGNOSIS — R972 Elevated prostate specific antigen [PSA]: Secondary | ICD-10-CM | POA: Diagnosis not present

## 2022-02-28 DIAGNOSIS — I6789 Other cerebrovascular disease: Secondary | ICD-10-CM | POA: Diagnosis not present

## 2022-03-16 IMAGING — DX DG CHEST 2V
2 series · 2 of 2 positions shown · non-contrast
Comparison: 07/24/2011

CLINICAL DATA: Chest tightness for 1 week, cough

EXAM:
CHEST - 2 VIEW

[dg chest 2 view (1 of 2)]
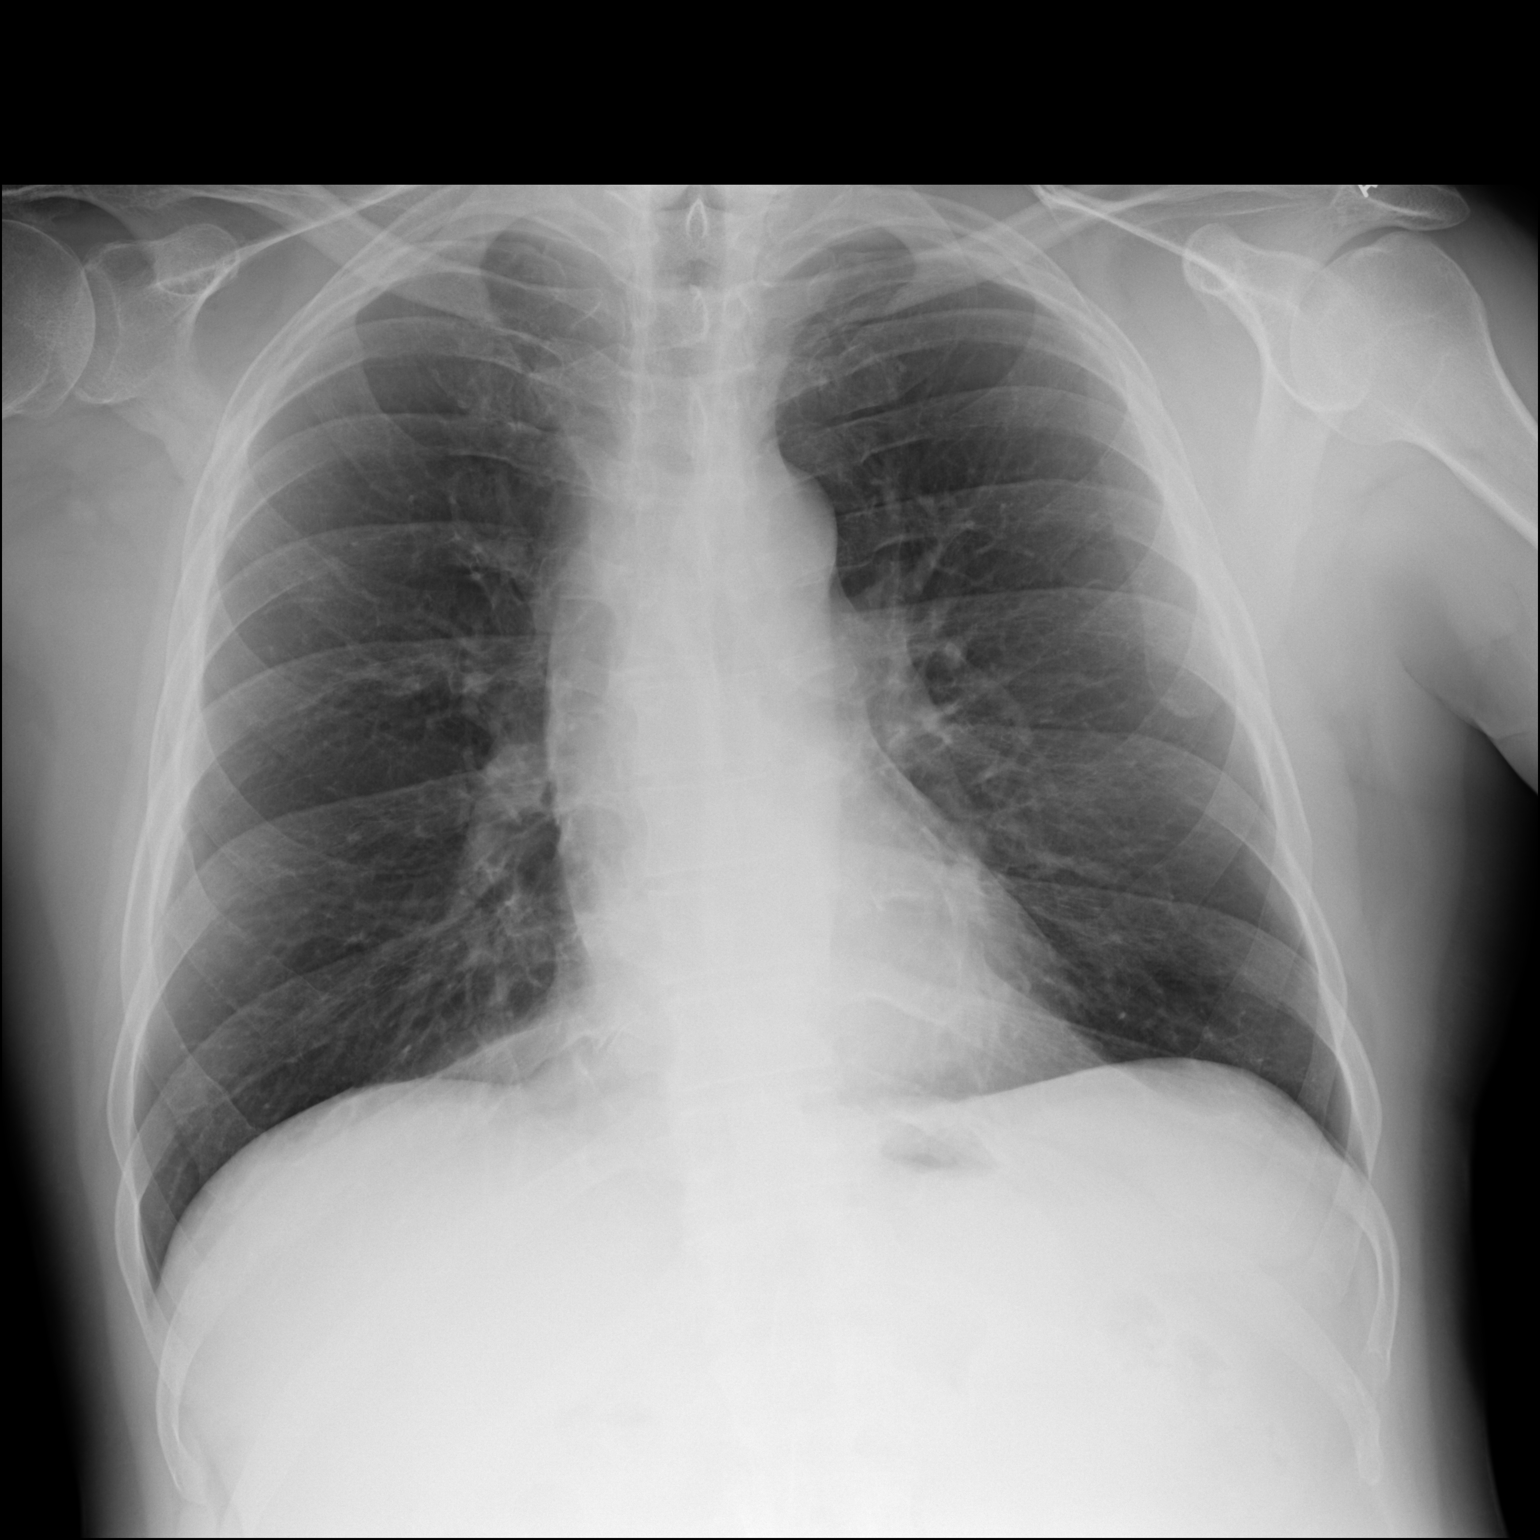

[dg chest 2 view (2 of 2)]
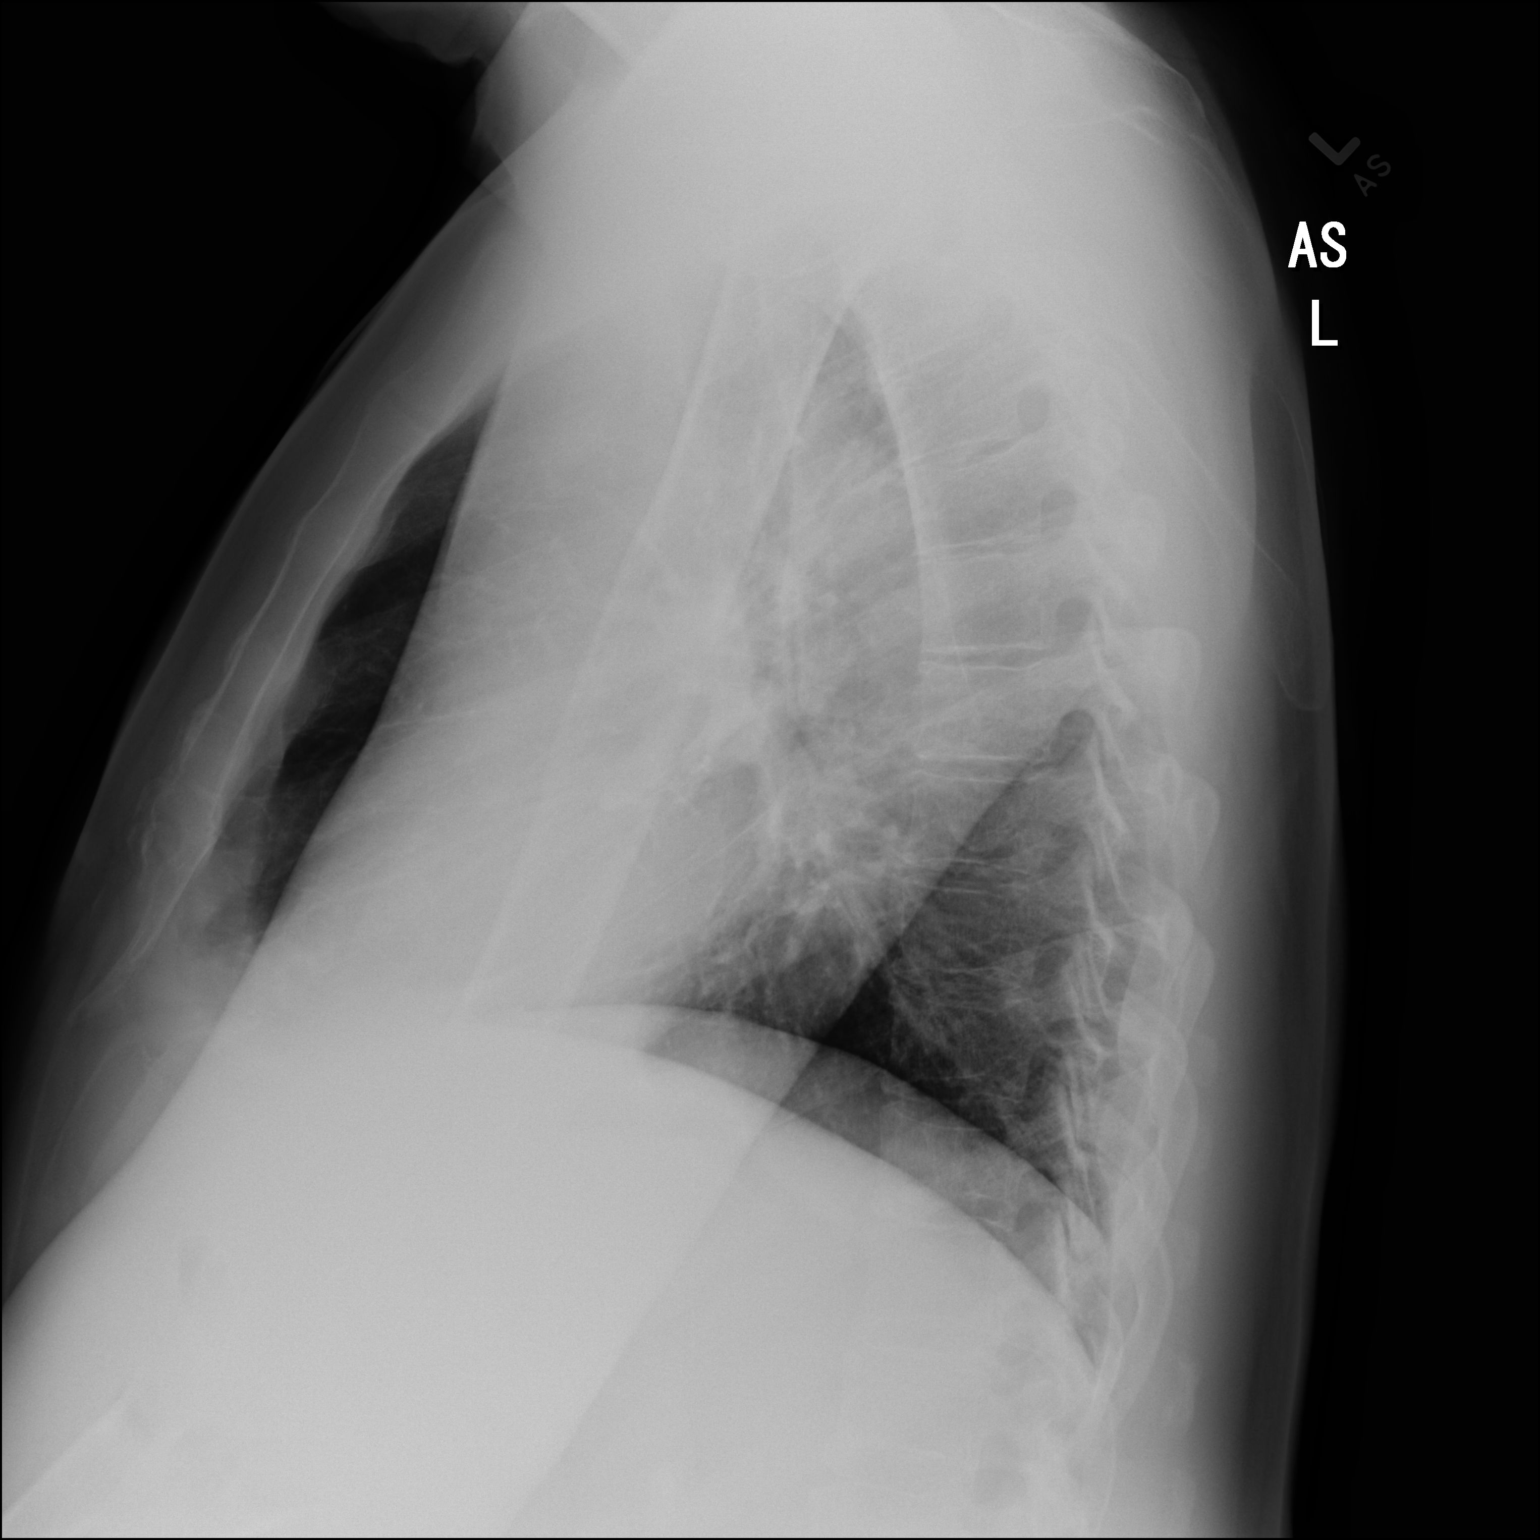

[2 of 2 positions shown; findings below may reference images not displayed]

FINDINGS: The heart size and mediastinal contours are within normal limits.
Both lungs are clear. The visualized skeletal structures are
unremarkable.
IMPRESSION: No active cardiopulmonary disease.

## 2022-03-26 DIAGNOSIS — G8191 Hemiplegia, unspecified affecting right dominant side: Secondary | ICD-10-CM | POA: Diagnosis not present

## 2022-03-26 DIAGNOSIS — C712 Malignant neoplasm of temporal lobe: Secondary | ICD-10-CM | POA: Diagnosis not present

## 2022-03-26 DIAGNOSIS — C719 Malignant neoplasm of brain, unspecified: Secondary | ICD-10-CM | POA: Diagnosis not present

## 2022-04-16 DIAGNOSIS — R29898 Other symptoms and signs involving the musculoskeletal system: Secondary | ICD-10-CM | POA: Diagnosis not present

## 2022-04-16 DIAGNOSIS — R41844 Frontal lobe and executive function deficit: Secondary | ICD-10-CM | POA: Diagnosis not present

## 2022-04-16 DIAGNOSIS — G8191 Hemiplegia, unspecified affecting right dominant side: Secondary | ICD-10-CM | POA: Diagnosis not present

## 2022-04-16 DIAGNOSIS — R413 Other amnesia: Secondary | ICD-10-CM | POA: Diagnosis not present

## 2022-04-16 DIAGNOSIS — C712 Malignant neoplasm of temporal lobe: Secondary | ICD-10-CM | POA: Diagnosis not present

## 2022-07-08 ENCOUNTER — Ambulatory Visit: Payer: Medicare Other | Admitting: Dermatology

## 2022-07-09 DIAGNOSIS — C712 Malignant neoplasm of temporal lobe: Secondary | ICD-10-CM | POA: Diagnosis not present

## 2022-07-09 DIAGNOSIS — C719 Malignant neoplasm of brain, unspecified: Secondary | ICD-10-CM | POA: Diagnosis not present

## 2022-08-26 ENCOUNTER — Inpatient Hospital Stay: Payer: Medicare HMO | Attending: Oncology | Admitting: Oncology

## 2022-08-26 VITALS — BP 123/82 | HR 60 | Temp 98.1°F | Resp 18 | Ht 68.0 in | Wt 189.4 lb

## 2022-08-26 DIAGNOSIS — Z79899 Other long term (current) drug therapy: Secondary | ICD-10-CM | POA: Insufficient documentation

## 2022-08-26 DIAGNOSIS — E039 Hypothyroidism, unspecified: Secondary | ICD-10-CM | POA: Diagnosis not present

## 2022-08-26 DIAGNOSIS — Z79890 Hormone replacement therapy (postmenopausal): Secondary | ICD-10-CM | POA: Diagnosis not present

## 2022-08-26 DIAGNOSIS — E538 Deficiency of other specified B group vitamins: Secondary | ICD-10-CM | POA: Diagnosis not present

## 2022-08-26 DIAGNOSIS — C719 Malignant neoplasm of brain, unspecified: Secondary | ICD-10-CM | POA: Diagnosis not present

## 2022-08-26 DIAGNOSIS — Z85841 Personal history of malignant neoplasm of brain: Secondary | ICD-10-CM | POA: Diagnosis not present

## 2022-08-26 DIAGNOSIS — F4321 Adjustment disorder with depressed mood: Secondary | ICD-10-CM | POA: Diagnosis not present

## 2022-08-26 DIAGNOSIS — R972 Elevated prostate specific antigen [PSA]: Secondary | ICD-10-CM | POA: Diagnosis not present

## 2022-08-26 DIAGNOSIS — I10 Essential (primary) hypertension: Secondary | ICD-10-CM | POA: Diagnosis not present

## 2022-08-26 NOTE — Progress Notes (Signed)
[facility] OFFICE PROGRESS NOTE   Diagnosis: Glioblastoma  INTERVAL HISTORY:   [name] returns as scheduled.  He feels well.  No new neurologic symptoms.  He continues to have weakness of the right arm and leg.  Stable right visual field deficit.  No seizures.  He continues close follow-up with Dr. Noe Gens at [facility].  A brain MRI 07/09/2022 revealed no evidence of disease progression.  Objective:  Vital signs in last 24 hours:  Blood pressure 123/82, pulse 60, temperature 98.1 F (36.7 C), temperature source Oral, resp. rate 18, height 5\' 8"  (1.727 m), weight 189 lb 6.4 oz (85.9 kg), SpO2 98 %.    Resp: Lungs clear bilaterally Cardio: Regular rate and rhythm GI: Nontender, no hepatosplenomegaly Vascular: No leg edema Neuro: Alert and oriented, partial expressive aphasia, weakness of the right hand and foot Skin: Erythema and hyperpigmentation in a sun exposure distribution   Lab Results:  Lab Results  Component Value Date   WBC 4.1 06/13/2017   HGB 13.2 06/13/2017   HCT 38.7 (L) 06/13/2017   MCV 99.2 06/13/2017   PLT 187.0 06/13/2017   NEUTROABS 2.4 06/13/2017    CMP  Lab Results  Component Value Date   NA 135 (A) 06/18/2016   K 3.9 06/18/2016   CL 109 02/10/2013   CO2 23 02/10/2013   GLUCOSE 90 02/10/2013   BUN 17 06/18/2016   CREATININE 1.1 06/18/2016   CALCIUM 8.8 02/10/2013   PROT 6.8 02/10/2013   ALBUMIN 3.8 02/10/2013   AST 28 06/18/2016   ALT 28 06/18/2016   ALKPHOS 71 06/18/2016   BILITOT 0.6 02/10/2013   GFRNONAA >60 05/12/2010   GFRAA  05/12/2010    >60        The eGFR has been calculated using the MDRD equation. This calculation has not been validated in all clinical situations. eGFR's persistently <60 mL/min signify possible Chronic Kidney Disease.      Medications: I have reviewed the patient's current medications.   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 An MRI of the brain during a  hospital admission on 03/27/2010 was compared to an MRI from [facility] on 02/22/2010. There was an increase in the abnormal signal of the left posterior opercular region and periatrial region, concerning for progression of tumor. Review of the MRI by Dr. Noe Gens at [facility] confirmed disease progression. Avastin was resumed on 05/28/2010. Hydroxyurea was initiated on 06/07/2010. The hydroxyurea was placed on hold on 06/27/2010 due to leukopenia and thrombocytopenia.   Initiation of salvage therapy with temozolomide and the Celldex vaccine beginning on 08/16/2010.   [facility] 10[facility]2010 is concerning for disease progression with an interval increase in enhancement surrounding the resection cavity and inferior margin of the putamen and left optic tract. MRI of the brain at [facility] on 11/28/2010 showed a slight interval increase in peripheral nodular enhancement surrounding the resection cavity He underwent a repeat MRI of the brain at [facility] on 12/26/2010-this showed a stable appearance of the left temporal lobe resection cavity and marginal enhancement. Restaging MRI of the brain 12/26/2010 at [facility] with a slight interval decrease in the size of residual left temporal lobe tumor Restaging MRI of the brain at [facility] on 02/20/2011 revealed a slight interval decrease in the residual left temporal lobe tumor Restaging MRI of the brain at [facility] on 04/03/2011 revealed stable disease. MRI 06/12/2011 at [facility] with stable left temporal resection cavity with decreased peripheral enhancement He completed a final cycle of  temozolomide beginning on 06/27/11 and continues monthly vaccine therapy PET scan on may 29th 2013 at [facility] definite foci of hypermetabolic FDG activity identified, in the region of the irregular nodular MRI enhancement in the inferior left temporal lobe there are no definite foci of hypermetabolic activity, there was low level FDG activity in this location similar to the surrounding nonenhancing left  temporal lobe Restaging MRI of the brain at [facility] on 11/06/2011-stable left brain resection site and? New less than 1 cm left brain lesion, no enhancement on a PET scan Stable radiographic disease on an MRI 12/31/2011 Continued treatment with the Celldex vaccine with an MRI of the brain at [facility] on 09/09/2012 revealed overall stable disease aside from a slight increase in a nodular focus of enhancement the left anterior temporal lobe with central necrosis MRI 12/02/2012 with increased enhancement in the anterior left temporal area MRI guided left temporal resection 01/07/2013-negative for tumor Continuation of treatment on the Celldex vaccine, treatment #32 on 01/27/2013 Restaging MRI at [facility] 03/08/2014 with no evidence of disease progression Dose #46 of the Celldex vaccine on 03/08/2014 MRI 01/13/2018- stable disease Dose #95  Celldex  vaccine 01/13/2018 MRI at [facility] 01/02/2015 without evidence of disease progression, dose #56 Celldex vaccine MRI at [facility] 06/19/2015-stable,Celldex # 64 given 08/15/2015 MRI at [facility] 05/21/2016-increase in size of focal nodular enhancement at the left temporal resection cavity MRI at [facility] 08/25/2018-stable MRI brain at [facility] 05/11/2019-stable 08/31/2019-end of Celldex vaccine, 116 total treatments given MRI brain at [facility] 02/18/2020-no evidence of progressive disease MRI brain at [facility] 08/27/2021-stable enhancement of the rim of the left brain resection cavity and stable areas of parenchymal enhancement in the left temporal lobe MRI brain at [facility] 07/09/2022-no evidence of disease progression 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 Right visual field deficit secondary to progression of the glioblastoma multiforme, stable History of generalized seizures, maintained on Keppra. Increase in the Keppra dose 09/09/2012 secondary to the possibility of visual seizures, Keppra dose increased 2017  secondary to episodes of aphasia-improved Hypothyroidism, maintained on Synthroid. History of Hypertension, essential hypertension and an effect of Avastin. Improved History of nephrotic syndrome secondary to Avastin. History of progressive thrombocytopenia, status post a bone marrow biopsy on 04/26/2010 with findings of a hypocellular marrow with decreased megakaryocytes and a left shift in the myeloid and erythroid series and minimal dyspoiesis. Cytogenetics returned normal. He again developed thrombocytopenia after receiving Avastin and beginning hydroxyurea in April 2012. The platelet count has improved since the Avastin and hydroxyurea were discontinued.   Vitamin B12 deficiency. Acute diverticulitis in April 2012, improved with ciprofloxacin and Flagyl. Related to Avastin (?).   Adhesive capsulitis of the right shoulder, followed by Dr. Amanda Pea. He is no longer taking pain medication and the range of motion has improved after a physical therapy program.   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 Low serum testosterone level on labs requested by Dr. Noe Gens in November 2012-he is not taking testosterone         14. Subdural hemorrhage after a fall requiring surgical evacuation in May 2015       15. Fall outside of his car 03/30/2014 with scalp abrasion/ecchymoses       Disposition: Mr. Hershman appears stable.  There is no clinical or radiologic evidence for progression of the glioblastoma.  He continues every 62-month follow-up with Dr. Noe Gens.  I am available to see him as needed.  He like  to continue follow-up at the cancer center here.  He will return for an office visit in 1 year.  Thornton Papas, MD  08/26/2022  12:32 PM

## 2022-08-27 ENCOUNTER — Telehealth: Payer: Self-pay | Admitting: Oncology

## 2022-09-04 DIAGNOSIS — N3281 Overactive bladder: Secondary | ICD-10-CM | POA: Diagnosis not present

## 2022-09-04 DIAGNOSIS — R350 Frequency of micturition: Secondary | ICD-10-CM | POA: Diagnosis not present

## 2022-09-04 DIAGNOSIS — R972 Elevated prostate specific antigen [PSA]: Secondary | ICD-10-CM | POA: Diagnosis not present

## 2022-09-20 DIAGNOSIS — R7309 Other abnormal glucose: Secondary | ICD-10-CM | POA: Diagnosis not present

## 2022-09-20 DIAGNOSIS — Z0000 Encounter for general adult medical examination without abnormal findings: Secondary | ICD-10-CM | POA: Diagnosis not present

## 2022-09-20 DIAGNOSIS — R569 Unspecified convulsions: Secondary | ICD-10-CM | POA: Diagnosis not present

## 2022-09-20 DIAGNOSIS — G3184 Mild cognitive impairment, so stated: Secondary | ICD-10-CM | POA: Diagnosis not present

## 2022-09-20 DIAGNOSIS — E7800 Pure hypercholesterolemia, unspecified: Secondary | ICD-10-CM | POA: Diagnosis not present

## 2022-09-20 DIAGNOSIS — Z79899 Other long term (current) drug therapy: Secondary | ICD-10-CM | POA: Diagnosis not present

## 2022-09-20 DIAGNOSIS — Z1211 Encounter for screening for malignant neoplasm of colon: Secondary | ICD-10-CM | POA: Diagnosis not present

## 2022-09-20 DIAGNOSIS — E039 Hypothyroidism, unspecified: Secondary | ICD-10-CM | POA: Diagnosis not present

## 2022-09-20 DIAGNOSIS — D519 Vitamin B12 deficiency anemia, unspecified: Secondary | ICD-10-CM | POA: Diagnosis not present

## 2022-09-20 DIAGNOSIS — G8191 Hemiplegia, unspecified affecting right dominant side: Secondary | ICD-10-CM | POA: Diagnosis not present

## 2022-09-20 DIAGNOSIS — C719 Malignant neoplasm of brain, unspecified: Secondary | ICD-10-CM | POA: Diagnosis not present

## 2022-10-08 DIAGNOSIS — C712 Malignant neoplasm of temporal lobe: Secondary | ICD-10-CM | POA: Diagnosis not present

## 2022-10-08 DIAGNOSIS — C220 Liver cell carcinoma: Secondary | ICD-10-CM | POA: Diagnosis not present

## 2022-10-11 DIAGNOSIS — T148XXA Other injury of unspecified body region, initial encounter: Secondary | ICD-10-CM | POA: Diagnosis not present

## 2022-10-11 DIAGNOSIS — L089 Local infection of the skin and subcutaneous tissue, unspecified: Secondary | ICD-10-CM | POA: Diagnosis not present

## 2022-10-25 DIAGNOSIS — D500 Iron deficiency anemia secondary to blood loss (chronic): Secondary | ICD-10-CM | POA: Diagnosis not present

## 2022-12-31 DIAGNOSIS — C220 Liver cell carcinoma: Secondary | ICD-10-CM | POA: Diagnosis not present

## 2022-12-31 DIAGNOSIS — C712 Malignant neoplasm of temporal lobe: Secondary | ICD-10-CM | POA: Diagnosis not present

## 2022-12-31 DIAGNOSIS — C719 Malignant neoplasm of brain, unspecified: Secondary | ICD-10-CM | POA: Diagnosis not present

## 2023-02-13 ENCOUNTER — Telehealth: Payer: Self-pay | Admitting: *Deleted

## 2023-02-13 MED ORDER — SERTRALINE HCL 25 MG PO TABS: 25.0000 mg | ORAL_TABLET | Freq: Every day | ORAL | 8 refills | Status: DC

## 2023-02-13 NOTE — Telephone Encounter (Addendum)
Mr. Kunkle called to report he has #3 doses left of his sertraline 25 mg and [facility] is not refilling it and his new PCP, Dr. Hillard Danker declines to refill until he is seen. Next appointment there is in August. Asking if Dr. Truett Perna can refill till he is seen there? Dr. Truett Perna agreed to refill till PCP can take over in August 2025.

## 2023-03-06 DIAGNOSIS — R3912 Poor urinary stream: Secondary | ICD-10-CM | POA: Diagnosis not present

## 2023-03-06 DIAGNOSIS — N3281 Overactive bladder: Secondary | ICD-10-CM | POA: Diagnosis not present

## 2023-03-18 DIAGNOSIS — L821 Other seborrheic keratosis: Secondary | ICD-10-CM | POA: Diagnosis not present

## 2023-03-18 DIAGNOSIS — D225 Melanocytic nevi of trunk: Secondary | ICD-10-CM | POA: Diagnosis not present

## 2023-03-18 DIAGNOSIS — L814 Other melanin hyperpigmentation: Secondary | ICD-10-CM | POA: Diagnosis not present

## 2023-03-24 DIAGNOSIS — S52021D Displaced fracture of olecranon process without intraarticular extension of right ulna, subsequent encounter for closed fracture with routine healing: Secondary | ICD-10-CM | POA: Diagnosis not present

## 2023-03-24 DIAGNOSIS — M25521 Pain in right elbow: Secondary | ICD-10-CM | POA: Diagnosis not present

## 2023-04-14 DIAGNOSIS — S52021D Displaced fracture of olecranon process without intraarticular extension of right ulna, subsequent encounter for closed fracture with routine healing: Secondary | ICD-10-CM | POA: Diagnosis not present

## 2023-04-14 DIAGNOSIS — M25521 Pain in right elbow: Secondary | ICD-10-CM | POA: Diagnosis not present

## 2023-04-15 DIAGNOSIS — C220 Liver cell carcinoma: Secondary | ICD-10-CM | POA: Diagnosis not present

## 2023-04-15 DIAGNOSIS — C712 Malignant neoplasm of temporal lobe: Secondary | ICD-10-CM | POA: Diagnosis not present

## 2023-04-16 DIAGNOSIS — S52021D Displaced fracture of olecranon process without intraarticular extension of right ulna, subsequent encounter for closed fracture with routine healing: Secondary | ICD-10-CM | POA: Diagnosis not present

## 2023-05-15 DIAGNOSIS — S52021D Displaced fracture of olecranon process without intraarticular extension of right ulna, subsequent encounter for closed fracture with routine healing: Secondary | ICD-10-CM | POA: Diagnosis not present

## 2023-05-16 DIAGNOSIS — L0390 Cellulitis, unspecified: Secondary | ICD-10-CM | POA: Diagnosis not present

## 2023-06-10 DIAGNOSIS — R350 Frequency of micturition: Secondary | ICD-10-CM | POA: Diagnosis not present

## 2023-06-16 DIAGNOSIS — S52021D Displaced fracture of olecranon process without intraarticular extension of right ulna, subsequent encounter for closed fracture with routine healing: Secondary | ICD-10-CM | POA: Diagnosis not present

## 2023-06-16 DIAGNOSIS — M25521 Pain in right elbow: Secondary | ICD-10-CM | POA: Diagnosis not present

## 2023-07-29 DIAGNOSIS — C712 Malignant neoplasm of temporal lobe: Secondary | ICD-10-CM | POA: Diagnosis not present

## 2023-07-30 DIAGNOSIS — S52021D Displaced fracture of olecranon process without intraarticular extension of right ulna, subsequent encounter for closed fracture with routine healing: Secondary | ICD-10-CM | POA: Diagnosis not present

## 2023-07-30 DIAGNOSIS — M25521 Pain in right elbow: Secondary | ICD-10-CM | POA: Diagnosis not present

## 2023-08-26 ENCOUNTER — Inpatient Hospital Stay: Payer: Medicare HMO | Attending: Oncology | Admitting: Oncology

## 2023-08-26 VITALS — BP 113/84 | HR 92 | Temp 98.1°F | Resp 18 | Ht 68.0 in | Wt 187.5 lb

## 2023-08-26 DIAGNOSIS — Z79890 Hormone replacement therapy (postmenopausal): Secondary | ICD-10-CM | POA: Diagnosis not present

## 2023-08-26 DIAGNOSIS — C719 Malignant neoplasm of brain, unspecified: Secondary | ICD-10-CM

## 2023-08-26 DIAGNOSIS — Z85841 Personal history of malignant neoplasm of brain: Secondary | ICD-10-CM | POA: Diagnosis not present

## 2023-08-26 DIAGNOSIS — E039 Hypothyroidism, unspecified: Secondary | ICD-10-CM | POA: Diagnosis not present

## 2023-08-26 DIAGNOSIS — R309 Painful micturition, unspecified: Secondary | ICD-10-CM | POA: Diagnosis not present

## 2023-08-26 DIAGNOSIS — R350 Frequency of micturition: Secondary | ICD-10-CM | POA: Insufficient documentation

## 2023-08-26 DIAGNOSIS — F4321 Adjustment disorder with depressed mood: Secondary | ICD-10-CM | POA: Diagnosis not present

## 2023-08-26 DIAGNOSIS — E538 Deficiency of other specified B group vitamins: Secondary | ICD-10-CM | POA: Diagnosis not present

## 2023-08-26 DIAGNOSIS — I10 Essential (primary) hypertension: Secondary | ICD-10-CM | POA: Insufficient documentation

## 2023-08-26 DIAGNOSIS — R351 Nocturia: Secondary | ICD-10-CM | POA: Insufficient documentation

## 2023-08-26 DIAGNOSIS — Z79899 Other long term (current) drug therapy: Secondary | ICD-10-CM | POA: Insufficient documentation

## 2023-08-26 NOTE — Progress Notes (Signed)
 [facility] OFFICE PROGRESS NOTE   Diagnosis: Glioblastoma Multiforme  INTERVAL HISTORY:   [name] returns as scheduled.  He feels well.  No new neurologic symptoms.  He continues clinical follow-up with Dr. Zulema.  Brain MRI 07/29/2023 revealed no evidence of disease progression. He reports urinary frequency and nocturia.  He has burning with urination.  He reports the symptoms initially improved with medical therapy, but have returned.  He is scheduling a follow-up urology appointment. Objective:  Vital signs in last 24 hours:  Blood pressure 113/84, pulse 92, temperature 98.1 F (36.7 C), temperature source Temporal, resp. rate 18, height 5' 8 (1.727 m), weight 187 lb 8 oz (85 kg), SpO2 98%.   Resp: Lungs clear bilaterally Cardio: Regular rate and rhythm GI: No hepatosplenomegaly Vascular: No leg edema Neuro: Weakness of the right hand and foot    Lab Results:  Lab Results  Component Value Date   WBC 4.1 06/13/2017   HGB 13.2 06/13/2017   HCT 38.7 (L) 06/13/2017   MCV 99.2 06/13/2017   PLT 187.0 06/13/2017   NEUTROABS 2.4 06/13/2017    CMP  Lab Results  Component Value Date   NA 135 (A) 06/18/2016   K 3.9 06/18/2016   CL 109 02/10/2013   CO2 23 02/10/2013   GLUCOSE 90 02/10/2013   BUN 17 06/18/2016   CREATININE 1.1 06/18/2016   CALCIUM 8.8 02/10/2013   PROT 6.8 02/10/2013   ALBUMIN 3.8 02/10/2013   AST 28 06/18/2016   ALT 28 06/18/2016   ALKPHOS 71 06/18/2016   BILITOT 0.6 02/10/2013   GFRNONAA >60 05/12/2010   GFRAA  05/12/2010    >60        The eGFR has been calculated using the MDRD equation. This calculation has not been validated in all clinical situations. eGFR's persistently <60 mL/min signify possible Chronic Kidney Disease.    Medications: I have reviewed the patient's current medications.   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 An MRI of the brain during a hospital admission on  03/27/2010 was compared to an MRI from [facility] on 02/22/2010. There was an increase in the abnormal signal of the left posterior opercular region and periatrial region, concerning for progression of tumor. Review of the MRI by Dr. Zulema at [facility] confirmed disease progression. Avastin was resumed on 05/28/2010. Hydroxyurea was initiated on 06/07/2010. The hydroxyurea was placed on hold on 06/27/2010 due to leukopenia and thrombocytopenia.   Initiation of salvage therapy with temozolomide  and the Celldex vaccine beginning on 08/16/2010.   MRI of the brain at [facility] on 10/31/2010 is concerning for disease progression with an interval increase in enhancement surrounding the resection cavity and inferior margin of the putamen and left optic tract. MRI of the brain at [facility] on 11/28/2010 showed a slight interval increase in peripheral nodular enhancement surrounding the resection cavity He underwent a repeat MRI of the brain at [facility] on 12/26/2010-this showed a stable appearance of the left temporal lobe resection cavity and marginal enhancement. Restaging MRI of the brain 12/26/2010 at [facility] with a slight interval decrease in the size of residual left temporal lobe tumor Restaging MRI of the brain at [facility] on 02/20/2011 revealed a slight interval decrease in the residual left temporal lobe tumor Restaging MRI of the brain at [facility] on 04/03/2011 revealed stable disease. MRI 06/12/2011 at [facility] with stable left temporal resection cavity with decreased peripheral enhancement He completed a final cycle of temozolomide  beginning on 06/27/11 and continues monthly vaccine therapy PET  scan on may 29th 2013 at [facility] definite foci of hypermetabolic FDG activity identified, in the region of the irregular nodular MRI enhancement in the inferior left temporal lobe there are no definite foci of hypermetabolic activity, there was low level FDG activity in this location similar to the surrounding nonenhancing left temporal  lobe Restaging MRI of the brain at [facility] on 11/06/2011-stable left brain resection site and? New less than 1 cm left brain lesion, no enhancement on a PET scan Stable radiographic disease on an MRI 12/31/2011 Continued treatment with the Celldex vaccine with an MRI of the brain at [facility] on 09/09/2012 revealed overall stable disease aside from a slight increase in a nodular focus of enhancement the left anterior temporal lobe with central necrosis MRI 12/02/2012 with increased enhancement in the anterior left temporal area MRI guided left temporal resection 01/07/2013-negative for tumor Continuation of treatment on the Celldex vaccine, treatment #32 on 01/27/2013 Restaging MRI at [facility] 03/08/2014 with no evidence of disease progression Dose #46 of the Celldex vaccine on 03/08/2014 MRI 01/13/2018- stable disease Dose #95  Celldex  vaccine 01/13/2018 MRI at [facility] 01/02/2015 without evidence of disease progression, dose #56 Celldex vaccine MRI at [facility] 06/19/2015-stable,Celldex # 64 given 08/15/2015 MRI at [facility] 05/21/2016-increase in size of focal nodular enhancement at the left temporal resection cavity MRI at [facility] 08/25/2018-stable MRI brain at [facility] 05/11/2019-stable 08/31/2019-end of Celldex vaccine, 116 total treatments given MRI brain at [facility] 02/18/2020-no evidence of progressive disease MRI brain at [facility] 08/27/2021-stable enhancement of the rim of the left brain resection cavity and stable areas of parenchymal enhancement in the left temporal lobe MRI brain at [facility] 07/09/2022-no evidence of disease progression MRI brain at [facility] 07/29/2023-no evidence of disease progression 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 Right visual field deficit secondary to progression of the glioblastoma multiforme, stable History of generalized seizures, maintained on Keppra . Increase in the Keppra  dose 09/09/2012 secondary to the  possibility of visual seizures, Keppra  dose increased 2017 secondary to episodes of aphasia-improved Hypothyroidism, maintained on Synthroid . History of Hypertension, essential hypertension and an effect of Avastin. Improved History of nephrotic syndrome secondary to Avastin. History of progressive thrombocytopenia, status post a bone marrow biopsy on 04/26/2010 with findings of a hypocellular marrow with decreased megakaryocytes and a left shift in the myeloid and erythroid series and minimal dyspoiesis. Cytogenetics returned normal. He again developed thrombocytopenia after receiving Avastin and beginning hydroxyurea in April 2012. The platelet count has improved since the Avastin and hydroxyurea were discontinued.   Vitamin B12 deficiency. Acute diverticulitis in April 2012, improved with ciprofloxacin and Flagyl. Related to Avastin (?).   Adhesive capsulitis of the right shoulder, followed by Dr. Camella. He is no longer taking pain medication and the range of motion has improved after a physical therapy program.   Situational depression-maintained on Zoloft ,       13. Low serum testosterone  level on labs requested by Dr. Zulema in November 2012-he is not taking testosterone          14. Subdural hemorrhage after a fall requiring surgical evacuation in May 2015       15. Fall outside of his car 03/30/2014 with scalp abrasion/ecchymoses        Disposition: Mr. Raucci remains in clinical remission from the glioblastoma.  He continues clinical radiologic follow-up with Dr. Zulema at [facility].  He would like to continue follow-up in the medical oncology care.  He will return for an  office visit in 1 year.  He plans to schedule a follow-up appointment with urology to evaluate urinary frequency and dysuria.  Arley Hof, MD  08/26/2023  11:15 AM

## 2023-09-01 DIAGNOSIS — R300 Dysuria: Secondary | ICD-10-CM | POA: Diagnosis not present

## 2023-09-01 DIAGNOSIS — R350 Frequency of micturition: Secondary | ICD-10-CM | POA: Diagnosis not present

## 2023-09-01 DIAGNOSIS — N3281 Overactive bladder: Secondary | ICD-10-CM | POA: Diagnosis not present

## 2023-09-03 DIAGNOSIS — M79671 Pain in right foot: Secondary | ICD-10-CM | POA: Diagnosis not present

## 2023-09-03 DIAGNOSIS — M21371 Foot drop, right foot: Secondary | ICD-10-CM | POA: Diagnosis not present

## 2023-09-03 DIAGNOSIS — B351 Tinea unguium: Secondary | ICD-10-CM | POA: Diagnosis not present

## 2023-09-03 DIAGNOSIS — R261 Paralytic gait: Secondary | ICD-10-CM | POA: Diagnosis not present

## 2023-09-09 DIAGNOSIS — R350 Frequency of micturition: Secondary | ICD-10-CM | POA: Diagnosis not present

## 2023-09-09 DIAGNOSIS — R338 Other retention of urine: Secondary | ICD-10-CM | POA: Diagnosis not present

## 2023-09-16 DIAGNOSIS — R338 Other retention of urine: Secondary | ICD-10-CM | POA: Diagnosis not present

## 2023-10-06 DIAGNOSIS — Z79899 Other long term (current) drug therapy: Secondary | ICD-10-CM | POA: Diagnosis not present

## 2023-10-06 DIAGNOSIS — Z0000 Encounter for general adult medical examination without abnormal findings: Secondary | ICD-10-CM | POA: Diagnosis not present

## 2023-10-06 DIAGNOSIS — G8191 Hemiplegia, unspecified affecting right dominant side: Secondary | ICD-10-CM | POA: Diagnosis not present

## 2023-10-06 DIAGNOSIS — E7800 Pure hypercholesterolemia, unspecified: Secondary | ICD-10-CM | POA: Diagnosis not present

## 2023-10-06 DIAGNOSIS — D7589 Other specified diseases of blood and blood-forming organs: Secondary | ICD-10-CM | POA: Diagnosis not present

## 2023-10-06 DIAGNOSIS — G3184 Mild cognitive impairment, so stated: Secondary | ICD-10-CM | POA: Diagnosis not present

## 2023-10-06 DIAGNOSIS — C719 Malignant neoplasm of brain, unspecified: Secondary | ICD-10-CM | POA: Diagnosis not present

## 2023-10-06 DIAGNOSIS — R7301 Impaired fasting glucose: Secondary | ICD-10-CM | POA: Diagnosis not present

## 2023-10-06 DIAGNOSIS — Z1211 Encounter for screening for malignant neoplasm of colon: Secondary | ICD-10-CM | POA: Diagnosis not present

## 2023-10-06 DIAGNOSIS — E039 Hypothyroidism, unspecified: Secondary | ICD-10-CM | POA: Diagnosis not present

## 2023-10-06 DIAGNOSIS — R569 Unspecified convulsions: Secondary | ICD-10-CM | POA: Diagnosis not present

## 2023-10-27 DIAGNOSIS — R338 Other retention of urine: Secondary | ICD-10-CM | POA: Diagnosis not present

## 2023-11-11 DIAGNOSIS — C719 Malignant neoplasm of brain, unspecified: Secondary | ICD-10-CM | POA: Diagnosis not present

## 2023-11-11 DIAGNOSIS — Z9889 Other specified postprocedural states: Secondary | ICD-10-CM | POA: Diagnosis not present

## 2023-11-11 DIAGNOSIS — C712 Malignant neoplasm of temporal lobe: Secondary | ICD-10-CM | POA: Diagnosis not present

## 2023-11-13 DIAGNOSIS — N3281 Overactive bladder: Secondary | ICD-10-CM | POA: Diagnosis not present

## 2023-11-13 DIAGNOSIS — R3912 Poor urinary stream: Secondary | ICD-10-CM | POA: Diagnosis not present

## 2023-11-13 DIAGNOSIS — R350 Frequency of micturition: Secondary | ICD-10-CM | POA: Diagnosis not present

## 2023-11-24 ENCOUNTER — Ambulatory Visit: Admitting: Physical Therapy

## 2023-11-24 ENCOUNTER — Encounter: Payer: Self-pay | Admitting: Physical Therapy

## 2023-11-24 ENCOUNTER — Other Ambulatory Visit: Payer: Self-pay

## 2023-11-24 VITALS — BP 138/80 | HR 75

## 2023-11-24 DIAGNOSIS — R29818 Other symptoms and signs involving the nervous system: Secondary | ICD-10-CM | POA: Diagnosis present

## 2023-11-24 DIAGNOSIS — R262 Difficulty in walking, not elsewhere classified: Secondary | ICD-10-CM | POA: Insufficient documentation

## 2023-11-24 DIAGNOSIS — R260 Ataxic gait: Secondary | ICD-10-CM | POA: Insufficient documentation

## 2023-11-24 DIAGNOSIS — C719 Malignant neoplasm of brain, unspecified: Secondary | ICD-10-CM | POA: Diagnosis present

## 2023-11-24 NOTE — Therapy (Signed)
 OUTPATIENT PHYSICAL THERAPY NEURO EVALUATION   Patient Name: [name] MRN: [id number] DOB:[birth year], 63 y.o., male Today's Date: 11/24/2023   PCP: Dwight Trula SQUIBB, MD REFERRING PROVIDER: Camalier, Curry Scarce, NP  END OF SESSION:  PT End of Session - 11/24/23 1410     Visit Number 1    Number of Visits 17   16 plus Eval   Date for Recertification  01/23/24    Authorization Type [employer]    PT Start Time 1410    PT Stop Time 1500    PT Time Calculation (min) 50 min    Equipment Utilized During Treatment Gait belt    Activity Tolerance Patient tolerated treatment well    Behavior During Therapy WFL for tasks assessed/performed          Past Medical History:  Diagnosis Date   ALLERGIC RHINITIS 01/09/2009   Glioblastoma multiforme (HCC)    stage IV   HYPERTENSION 01/09/2009   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   Left Temple   SEIZURE DISORDER 01/09/2009   Thyroid  disease    Hypothyroidism   Past Surgical History:  Procedure Laterality Date   CRANIOTOMY  2009   glioblastoma   Patient Active Problem List   Diagnosis Date Noted   Hypothyroidism 04/28/2015   GBM (glioblastoma multiforme) (HCC) 01/03/2011   Essential hypertension 01/09/2009   Allergic rhinitis 01/09/2009   SEIZURE DISORDER 01/09/2009    ONSET DATE: 2010 Stage 4 brain CA  REFERRING DIAG: C71.2 (ICD-10-CM) - Malignant neoplasm of temporal lobe  THERAPY DIAG:  Ataxic gait  Difficulty in walking, not elsewhere classified  Other symptoms and signs involving the nervous system  GBM (glioblastoma multiforme) (HCC)  Rationale for Evaluation and Treatment: Rehabilitation  SUBJECTIVE:                                                                                                                                                                                             SUBJECTIVE STATEMENT: Pt reports dragging R leg really bad with walking (has had R sided impairments  from/since chemo); pt reports slowing down d/t age.  Did therapy here about 10 years ago.  Currently having balance and strength concerns with functional activities (ex: reaching up/down) and walking.  L leg tingles d/t d/t overcompensation (per pt report) or circulation issues (per wife report).  Urology issues reported--goes to bathroom a lot; plans to go to pelvic health physical therapy November 3rd.  Has stairs stepper at home (put together last month) and doing occasionally. 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  PMH also includes: Spastic hemiplegia affecting right dominant side (CMS/HHS-HCC), sub-acute Cerebral infarct (CMS-HCC), seizures, htn, anemia, asthma, hypopotassemia.  Pt also reports having visual cut R side.  PAIN:  Are you having pain? Yes: NPRS scale: 5/10 Pain location: R UE/LE Pain description: stiff; tight Aggravating factors: not exercising or exercising too much Relieving factors: movement (not too much or too little)  PRECAUTIONS: Fall; R field visual cut  RED FLAGS: None   WEIGHT BEARING RESTRICTIONS: No  FALLS: Has patient fallen in last 6 months? No  LIVING ENVIRONMENT: Lives with: lives with their spouse Lives in: House/apartment Stairs: No Has following equipment at home: Single point cane, Quad cane small base, Walker - 2 wheeled, Crutches, shower chair, bed side commode, and Grab bars  PLOF: Independent with gait and Independent with transfers; assist putting on the belt, coat, and tie (extra time/effort to perform on own)  PATIENT GOALS: 5 years from now I still want to be walking.  Improve walking pattern and balance.  Don't want to regress; want to get  better.  OBJECTIVE:  Note: Objective measures were completed at Evaluation unless otherwise noted.  DIAGNOSTIC FINDINGS: 11/11/23 MRI Brain with and without contrast: IMPRESSION: Stable postoperative changes within the left temporal lobe. Compared to 07/29/2023, similar enhancement along the resection margin and inferomedial left temporal lobe. No evidence of local recurrence or metastatic disease within the brain. BT-RADS 2a.   COGNITION: Overall cognitive status: Within functional limits for tasks assessed   SENSATION: Not tested (pt reports no concerns)  COORDINATION: Impaired R LE heel to shin  MUSCLE TONE: RLE: Hypertonic  POSTURE: rounded shoulders and forward head  LOWER EXTREMITY ROM:     Active  Right Eval Left Eval  Hip flexion [facility]  Hip extension    Hip abduction [facility]  Hip adduction    Hip internal rotation    Hip external rotation    Knee flexion [facility] WFL  Knee extension [facility]  Ankle dorsiflexion [facility] WFL  Ankle plantarflexion    Ankle inversion    Ankle eversion     (Blank rows = not tested)  LOWER EXTREMITY MMT:    MMT Right Eval Left Eval  Hip flexion 4+/5 5/5  Hip extension    Hip abduction    Hip adduction    Hip internal rotation    Hip external rotation    Knee flexion 4-/5 5/5  Knee extension 4-/5 5/5  Ankle dorsiflexion 2+/5 5/5  Ankle plantarflexion 2+/5 >3/5  Ankle inversion    Ankle eversion    (Blank rows = not tested)  BED MOBILITY:  Not tested  Pt reports being independent at home  TRANSFERS:  Requires L UE support Sit to stand: SBA  Assistive device utilized: None     Stand to sit: SBA  Assistive device utilized: None     Chair to chair: SBA  Assistive device utilized: None       STAIRS: Not tested.  Pt reports able to do stairs with use of railing. GAIT: Findings: Gait Characteristics: step through pattern, decreased arm swing- Right, decreased arm swing- Left, decreased step length- Right, decreased step  length- Left, decreased stance time- Right, decreased stride length, decreased hip/knee flexion- Right, decreased ankle dorsiflexion- Right, circumduction- Right, Right foot flat,  ataxic, decreased trunk rotation, and poor foot clearance- Right, Distance walked: Clinic distances, Assistive device utilized:None, Level of assistance: SBA, and Comments: R LE externally rotated; R UE flexed and at side; increased L lateral weight shift  FUNCTIONAL TESTS:  5 times sit to stand: 14.19 seconds (decreased control/quality of movement R LE 3rd-5th stand); use of L UE Timed up and go (TUG): 12.35 seconds (did trial run first) 6 minute walk test: TBA Functional gait assessment: TBA  PATIENT SURVEYS:  ABC scale: TBA                                                                                                                               TREATMENT DATE: 11/24/23  Therapeutic Exercise (for HEP): to improve strength and endurance impairments Standing March with Counter Support: x10 reps B LE's x1 set Standing Hip Abduction with Unilateral Counter Support: x5 reps B LE's x1 set Heel Raises with Counter Support: x10 reps B LE's (at same time) x1 set -Level of assist: SBA -Notes: Pt felt R knee was weak (concern with R knee buckling with fatigue) during hip aBduction exercise (standing on R LE during L LE hip aBduction) so limited to 5 reps d/t safety concerns.   Decreased quality of movement noted in general with R LE marching and hip aBduction.    PATIENT EDUCATION: Education details: Evaluation results; POC; HEP. Person educated: Patient and Spouse Education method: Explanation, Demonstration, Verbal cues, and Handouts Education comprehension: verbalized understanding, returned demonstration, and verbal cues required  HOME EXERCISE PROGRAM: Access Code: [email or web address] URL: [email or web address] Date: 11/24/2023 Prepared by: Damien Caulk  Exercises - Standing March with Counter Support   - 1 x daily - 5-7 x weekly - 1-2 sets - 10 reps - Standing Hip Abduction with Unilateral Counter Support  - 1 x daily - 5 x weekly - 1 sets - 5 reps - Heel Raises with Counter Support  - 1 x daily - 5-7 x weekly - 1-2 sets - 10 reps  GOALS: Goals reviewed with patient? Yes  SHORT TERM GOALS: Target date: 12/26/23  Pt will be independent with initial HEP in order to improve strength and balance in order to decrease fall risk and improve function at home for ADL's and at work.  Baseline: Goal status: INITIAL  2.  Assess (to demonstrate improvement in cardiopulmonary endurance and community ambulation).  Baseline: TBA Goal status: INITIAL  3.  Assess Functional Gait Assessment (to reduce fall risk and improve dynamic gait safety with community ambulation). Baseline: TBA Goal status: INITIAL  4.  *** Baseline:  Goal status: INITIAL  5.  *** Baseline:  Goal status: INITIAL  6.  *** Baseline:  Goal status: INITIAL  LONG TERM GOALS: Target date: 01/23/24  Pt will decrease 5 Time Sit to Stand by at least 3 seconds in order to demonstrate clinically significant improvement in LE strength.  Baseline: 14.19 seconds (decreased control/quality of movement R LE 3rd-5th  stand); use of L UE Goal status: INITIAL  2.  Pt will be independent with final HEP in order to improve strength and balance in order to decrease fall risk and improve function at home for ADL's and at work.  Baseline:  Goal status: INITIAL  3.  Patient will be independent in bending down towards floor and picking up small object (<5 pounds) and then stand back up without loss of balance as to improve ability to pick up and clean up room at home.  Baseline:  Goal status: INITIAL  4.  Pt will increase by at least 58m (169ft) in order to demonstrate clinically significant improvement in cardiopulmonary endurance and community ambulation.  Baseline: TBA Goal status: INITIAL  5.  Patient will increase  Functional Gait Assessment score to >20/30 as to reduce fall risk and improve dynamic gait safety with community ambulation.  Baseline: TBA Goal status: INITIAL  6.  *** Baseline:  Goal status: INITIAL  ASSESSMENT:  CLINICAL IMPRESSION: Patient is a 63 y.o. male who was seen today for physical therapy evaluation and treatment for balance and strength concerns d/t h/o left temporal glioblastoma s/p treatment.  Patient presents with impaired R UE/LE strength, tone, balance, and functional limitations including transfers and ambulation. These impairments are limiting patient from performing daily activities d/t strength and balance concerns.  Evaluation included the following assessment tools: 5 time sit to stand and TUG.  Pt scored *** seconds on the 5 time sit to stand test indicating pt is *** (>15 seconds = increased risk of falls).  Pt scored *** on the TUG indicating pt is ***.  Patient will benefit from skilled PT to address noted impairments, improve overall function, and progress towards long term goals.    OBJECTIVE IMPAIRMENTS: Abnormal gait, decreased activity tolerance, decreased balance, decreased cognition, decreased coordination, decreased endurance, decreased knowledge of condition, decreased knowledge of use of DME, decreased mobility, difficulty walking, decreased strength, impaired tone, and impaired UE functional use.   ACTIVITY LIMITATIONS: carrying, lifting, bending, sitting, standing, squatting, stairs, transfers, continence, bathing, toileting, dressing, reach over head, locomotion level, and caring for others  PARTICIPATION LIMITATIONS: meal prep, cleaning, laundry, driving, shopping, community activity, occupation, and yard work  PERSONAL FACTORS: Age, Past/current experiences, Time since onset of injury/illness/exacerbation, and 3+ comorbidities: PMH including Left temporal glioblastoma, SDH L, spastic hemiplegia, seizures, visual cut R side are also affecting patient's  functional outcome.   REHAB POTENTIAL: Good  CLINICAL DECISION MAKING: Evolving/moderate complexity  EVALUATION COMPLEXITY: Moderate  PLAN:  PT FREQUENCY: 2x/week  PT DURATION: 8 weeks  PLANNED INTERVENTIONS: [id number]- PT Re-evaluation, 97750- Physical Performance Testing, 97110-Therapeutic exercises, 97530- Therapeutic activity, [id number]- Neuromuscular re-education, 97535- Self Care, [id number]- Manual therapy, [id number]- Gait training, [phone]- Orthotic Initial, [phone]- Orthotic/Prosthetic subsequent, [phone]- Aquatic Therapy, [phone]- Electrical stimulation (manual), [phone]- Ultrasound, Patient/Family education, Balance training, Stair training, Joint mobilization, DME instructions, Cryotherapy, and Moist heat  PLAN FOR NEXT SESSION: Assess and FGA; review HEP; LE strengthening; SCI-FIT for strengthening and reciprocal movement; SLS activities; balance   Damien Caulk, PT 11/24/2023, 7:37 PM

## 2023-12-01 ENCOUNTER — Encounter: Payer: Self-pay | Admitting: Physical Therapy

## 2023-12-01 ENCOUNTER — Ambulatory Visit: Admitting: Physical Therapy

## 2023-12-01 VITALS — BP 134/80 | HR 81

## 2023-12-01 DIAGNOSIS — R260 Ataxic gait: Secondary | ICD-10-CM

## 2023-12-01 DIAGNOSIS — C719 Malignant neoplasm of brain, unspecified: Secondary | ICD-10-CM

## 2023-12-01 DIAGNOSIS — R29818 Other symptoms and signs involving the nervous system: Secondary | ICD-10-CM

## 2023-12-01 DIAGNOSIS — R262 Difficulty in walking, not elsewhere classified: Secondary | ICD-10-CM

## 2023-12-01 NOTE — Therapy (Signed)
 OUTPATIENT PHYSICAL THERAPY NEURO TREATMENT   Patient Name: [name] MRN: [id number] DOB:1960-07-26, 63 y.o., male Today's Date: 12/01/2023   PCP: Dwight Trula SQUIBB, MD REFERRING PROVIDER: Camalier, Curry Scarce, NP  END OF SESSION:  PT End of Session - 12/01/23 1405     Visit Number 2    Number of Visits 17   16 plus Eval   Date for Recertification  01/23/24    Authorization Type [employer] Medicare    Authorization Time Period Auth Pending    PT Start Time 1402    PT Stop Time 1450    PT Time Calculation (min) 48 min    Equipment Utilized During Treatment Gait belt    Activity Tolerance Patient tolerated treatment well    Behavior During Therapy WFL for tasks assessed/performed          Past Medical History:  Diagnosis Date   ALLERGIC RHINITIS 01/09/2009   Glioblastoma multiforme (HCC)    stage IV   HYPERTENSION 01/09/2009   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   Left [place]   SEIZURE DISORDER 01/09/2009   Thyroid  disease    Hypothyroidism   Past Surgical History:  Procedure Laterality Date   CRANIOTOMY  2009   glioblastoma   Patient Active Problem List   Diagnosis Date Noted   Hypothyroidism 04/28/2015   GBM (glioblastoma multiforme) (HCC) 01/03/2011   Essential hypertension 01/09/2009   Allergic rhinitis 01/09/2009   SEIZURE DISORDER 01/09/2009    ONSET DATE: 2010 Stage 4 brain CA per pt report  REFERRING DIAG: C71.2 (ICD-10-CM) - Malignant neoplasm of temporal lobe  THERAPY DIAG:  Ataxic gait  Difficulty in walking, not elsewhere classified  Other symptoms and signs involving the nervous system  GBM (glioblastoma multiforme) (HCC)  Rationale for Evaluation and Treatment: Rehabilitation  SUBJECTIVE:                                                                                                                                                                                             SUBJECTIVE STATEMENT:  Pt reports no changes  since last eval.  Drove self.  Has been active and doing stairs in community (holds onto railing).  No recent falls. 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  PMH also includes: Spastic hemiplegia affecting right dominant side (CMS/HHS-HCC), sub-acute Cerebral infarct (CMS-HCC), seizures, htn, anemia, asthma, hypopotassemia.  Pt also reports having  visual cut R side.  PAIN:  Are you having pain? Yes: NPRS scale: 0/10 currently Pain location: R UE/LE Pain description: stiff; tight Aggravating factors: not exercising or exercising too much Relieving factors: movement (not too much or too little)  PRECAUTIONS: Fall; R field visual cut  RED FLAGS: None   WEIGHT BEARING RESTRICTIONS: No  FALLS: Has patient fallen in last 6 months? No  LIVING ENVIRONMENT: Lives with: lives with their spouse Lives in: House/apartment Stairs: No Has following equipment at home: Single point cane, Quad cane small base, Walker - 2 wheeled, Crutches, shower chair, bed side commode, and Grab bars  PLOF: Independent with gait and Independent with transfers; assist putting on the belt, coat, and tie (extra time/effort to perform on own)  PATIENT GOALS: 5 years from now I still want to be walking.  Improve walking pattern and balance.  Don't want to regress; want to get better.  OBJECTIVE:  Note: Objective measures were completed at Evaluation unless otherwise noted.  DIAGNOSTIC FINDINGS: 11/11/23 MRI Brain with and without contrast: IMPRESSION: Stable postoperative changes within the left temporal lobe. Compared to 07/29/2023, similar enhancement along the resection margin and inferomedial left temporal lobe. No evidence of local recurrence or metastatic  disease within the brain. BT-RADS 2a.   COGNITION: Overall cognitive status: Within functional limits for tasks assessed   SENSATION: Not tested (pt reports no concerns)  COORDINATION: Impaired R LE heel to shin in sitting  MUSCLE TONE: RLE: Hypertonic  POSTURE: rounded shoulders and forward head  LOWER EXTREMITY ROM:     Active  Right Eval Left Eval  Hip flexion [facility]  Hip extension    Hip abduction [facility]  Hip adduction    Hip internal rotation    Hip external rotation    Knee flexion [facility] WFL  Knee extension [facility] WFL  Ankle dorsiflexion WFL (*PROM) WFL  Ankle plantarflexion    Ankle inversion    Ankle eversion     (Blank rows = not tested)  LOWER EXTREMITY MMT:    MMT Right Eval Left Eval  Hip flexion 4+/5 5/5  Hip extension    Hip abduction    Hip adduction    Hip internal rotation    Hip external rotation    Knee flexion 4-/5 5/5  Knee extension 4-/5 5/5  Ankle dorsiflexion 2+/5 5/5  Ankle plantarflexion 2+/5 >3/5  Ankle inversion    Ankle eversion    (Blank rows = not tested)  BED MOBILITY:  Not tested  Pt reports being independent at home  TRANSFERS:  Requires L UE support Sit to stand: SBA  Assistive device utilized: None     Stand to sit: SBA  Assistive device utilized: None     Chair to chair: SBA  Assistive device utilized: None       STAIRS: Not tested.  Pt reports able to do stairs with use of railing. GAIT: Findings: Gait Characteristics: step through pattern, decreased arm swing- Right, decreased arm swing- Left, decreased step length- Right, decreased step length- Left, decreased stance time- Right, decreased stride length, decreased hip/knee flexion- Right, decreased ankle dorsiflexion- Right, circumduction- Right, Right foot flat, ataxic, decreased trunk rotation, and poor foot clearance- Right, Distance walked: Clinic distances, Assistive device utilized:None, Level of assistance: SBA, and Comments: R LE externally rotated; R UE  flexed and at side; increased L lateral weight shift  FUNCTIONAL TESTS:  5 times sit to stand: 14.19 seconds (decreased control/quality of movement R LE 3rd-5th stand);  use of L UE; on Eval Timed up and go (TUG): 12.35 seconds (did trial run first); on Eval 6 minute walk test: 671 feet (decreased R foot clearance and gait speed with increased distance ambulating) 12/01/23 Functional gait assessment: 11/30 (12/01/23)  PATIENT SURVEYS:  ABC scale: TBA                                                                                                                               TREATMENT DATE: 12/01/23  Self Care BP and HR taken in sitting at rest beginning of session   Vitals:   12/01/23 1406  BP: 134/80  Pulse: 81   Therapeutic Exercise: HEP reviewed and updated (number of sets increased); see HEP below for details  Therapeutic Activities:  FGA FUNCTIONAL GAIT ASSESSMENT  Date: 12/01/23 Score  GAIT LEVEL SURFACE Instructions: Walk at your normal speed from here to the next [place] (6 m) [20 ft]. (2) Mild impairment - Walks 6 m (20 ft) in less than 7 seconds but greater than 5.5 seconds, uses assistive device, slower speed, mild gait deviations, or deviates 15.24 -25.4 cm (6 -10 in) outside of the 30.48-cm (12-in) walkway width. 6.88 seconds  2.   CHANGE IN GAIT SPEED Instructions: Begin walking at your normal pace (for 1.5 m [5 ft]). When I tell you "go," walk as fast as you can (for 1.5 m [5 ft]). When I tell you "slow," walk as slowly as you can (for 1.5 m [5 ft]. (1) Moderate impairment - Makes only minor adjustments to walking speed, or accomplishes a change in speed with significant gait deviations, deviates 25.4 -38.1 cm (10 -15 in) outside the 30.48-cm (12-in) walkway width, or changes speed but loses balance but is able to recover and continue walking.  3.    GAIT WITH HORIZONTAL HEAD TURNS Instructions: Walk from here to the next [place] 6 m (20 ft) away. Begin walking at your  normal pace. Keep walking straight; after 3 steps, turn your head to the right and keep walking straight while looking to the right. After 3 more steps, turn your head to the left and keep walking straight while looking left. Continue alternating looking right and left. (1) Moderate impairment - Performs head turns with moderate change in gait velocity, slows down, deviates 25.4 -38.1 cm (10 -15 in) outside 30.48-cm (12-in) walkway width but recovers, can continue to walk. Unable to maintain head turns >1 second B  4.   GAIT WITH VERTICAL HEAD TURNS Instructions: Walk from here to the next [place] (6 m [20 ft]). Begin walking at your normal pace. Keep walking straight; after 3 steps, tip your head up and keep walking straight while looking up. After 3 more steps, tip your head down, keep walking straight while looking down. Continue  alternating looking up and down every 3 steps until you have completed 2 repetitions in each direction. (2) Mild impairment - Performs task with slight change in  gait velocity (eg, minor disruption to smooth gait path), deviates 15.24 -25.4 cm (6 -10 in) outside 30.48-cm (12-in) walkway width or uses assistive device.  5.  GAIT AND PIVOT TURN Instructions: Begin with walking at your normal pace. When I tell you, "turn and stop," turn as quickly as you can to face the opposite direction and stop. (2) Mild impairment - Pivot turns safely in 3 seconds and stops with no loss of balance, or pivot turns safely within 3 seconds and stops with mild imbalance, requires small steps to catch balance  6.   STEP OVER OBSTACLE Instructions: Begin walking at your normal speed. When you come to the shoe box, step over it, not around it, and keep walking. (0) Severe impairment-Cannot perform without assistance. Min assist 4.5 inch height  7.   GAIT WITH NARROW BASE OF SUPPORT Instructions: Walk on the floor with arms folded across the chest, feet aligned heel to toe in tandem for a distance of 3.6  m [12 ft]. The number of steps taken in a straight line are counted for a maximum of 10 steps. (0) Severe impairment - Ambulates less than 4 steps heel to toe or cannot perform without assistance. Unable without assistance.  8.   GAIT WITH EYES CLOSED Instructions: Walk at your normal speed from here to the next [place] (6 m [20 ft]) with your eyes closed. (0) Severe impairment - Cannot walk 6 m (20 ft) without assistance, severe gait deviations or imbalance, deviates greater than 38.1 cm (15 in) outside 30.48-cm (12-in) walkway width or will not attempt task. 24.78 seconds; significant path veer to L  9.   AMBULATING BACKWARDS Instructions: Walk backwards until I tell you to stop (1) Moderate impairment - Walks 6 m (20 ft), slow speed, abnormal gait pattern, evidence for imbalance, deviates 25.4 -38.1 cm (10 -15 in) outside 30.48-cm (12-in) walkway width. 25.66 seconds  10. STEPS Instructions: Walk up these stairs as you would at home (ie, using the rail if necessary). At the top turn around and walk down. (2) Mild impairment-Alternating feet, must use rail.  Total 11/30   Interpretation of scores: Non-Specific Older Adults Cutoff Score: <=22/30 = risk of falls Parkinson's Disease Cutoff score <15/30= fall risk (Hoehn & Yahr 1-4)  Minimally Clinically Important Difference (MCID)  Stroke (acute, subacute, and chronic) = MDC: 4.2 points Vestibular (acute) = MDC: 6 points Community Dwelling Older Adults =  MCID: 4 points Parkinson's Disease  =  MDC: 4.3 points  (Academy of Neu[employer]ic [employer] Therapy (nd). Functional Gait Assessment. Retrieved from https://www.neuropt.org/docs/default-source/cpgs/core-outcome-measures/function-gait-assessment-pocket-guide-proof9-(2).pdf?sfvrsn=b69f35043_0.)  6 minute walk test: 671 feet (decreased R foot clearance and gait speed with increased distance ambulating)  11/24/23  Therapeutic Exercise (for HEP): to improve strength and endurance impairments Standing  March with Counter Support: x10 reps B LE's x1 set Standing Hip Abduction with Unilateral Counter Support: x5 reps B LE's x1 set Heel Raises with Counter Support: x10 reps B LE's (at same time) x1 set -Level of assist: SBA -Notes: Pt felt R knee was weak (concern with R knee buckling with fatigue) during hip aBduction exercise (standing on R LE during L LE hip aBduction) so limited to 5 reps d/t safety concerns.   Decreased quality of movement noted in general with R LE marching and hip aBduction.    PATIENT EDUCATION: 12/01/23 Education details: Reviewed HEP and advanced number of sets; discussed need to focus on quality of movement of HEP exercises.  Reviewed results of FGA and and implications  of scores/performance. Person educated: Patient Education method: Explanation, Demonstration, Verbal cues, and Handouts Education comprehension: verbalized understanding, returned demonstration, and verbal cues required  HOME EXERCISE PROGRAM: 12/01/23 Access Code: [email or web address] URL: [email or web address] Date: 12/01/2023 Prepared by: Damien Caulk  Exercises - Standing March with Counter Support  - 1 x daily - 5-7 x weekly - 3 sets - 10 reps - Standing Hip Abduction with Unilateral Counter Support  - 1 x daily - 5 x weekly - 2 sets - 5 reps - Heel Raises with Counter Support  - 1 x daily - 5-7 x weekly - 3 sets - 10 reps  GOALS: Goals reviewed with patient? Yes  SHORT TERM GOALS: Target date: 12/26/23  Pt will be independent with initial HEP in order to improve strength and balance in order to decrease fall risk and improve function at home for ADL's and at work.  Baseline: Goal status: INITIAL  2.  Assess (to demonstrate improvement in cardiopulmonary endurance and community ambulation).  Baseline: 671 feet 12/01/23 Goal status: MET  3.  Assess Functional Gait Assessment (to reduce fall risk and improve dynamic gait safety with community ambulation). Baseline:  11/30 (12/01/23) Goal status: MET  4.  Pt will demonstrate improved R foot clearance for safety with gait.  Baseline:  Goal status: INITIAL    LONG TERM GOALS: Target date: 01/23/24  Pt will decrease 5 Time Sit to Stand by at least 3 seconds (and quality of movement) in order to demonstrate clinically significant improvement in LE strength.  Baseline: 14.19 seconds (decreased control/quality of movement R LE 3rd-5th stand; use of L UE) 11/24/23 Goal status: INITIAL  2.  Pt will be independent with final HEP in order to improve strength and balance in order to decrease fall risk and improve function at home for ADL's and at work.  Baseline:  Goal status: INITIAL  3.  Patient will be independent in bending down towards floor and picking up small object (<5 pounds) and then stand back up without loss of balance as to improve ability to pick up and clean up room at home.  Baseline:  Goal status: INITIAL  4.  Pt will increase by at least 55m (162ft) in order to demonstrate clinically significant improvement in cardiopulmonary endurance and community ambulation.  Baseline: 671 feet 12/01/23 Goal status: INITIAL  5.  Patient will increase Functional Gait Assessment score to >20/30 as to reduce fall risk and improve dynamic gait safety with community ambulation.  Baseline: 11/30 (12/01/23) Goal status: INITIAL   ASSESSMENT:  CLINICAL IMPRESSION: Patient was seen today for physical therapy treatment to address balance, strength, and functional impairments.  Focused session on FGA, , and reviewing HEP.  Pt scored 11/30 on FGA indicating pt is at increased risk of falls.  Pt able to ambulate 671 feet on ; decreased R foot clearance and gait speed noted with increased distance ambulating.  Patient continues to be limited by gait impairments, weakness, and balance.  They would continue to benefit from skilled PT to address impairments as noted and progress towards long term goals.     OBJECTIVE IMPAIRMENTS: Abnormal gait, decreased activity tolerance, decreased balance, decreased cognition, decreased coordination, decreased endurance, decreased knowledge of condition, decreased knowledge of use of DME, decreased mobility, difficulty walking, decreased strength, impaired tone, and impaired UE functional use.   ACTIVITY LIMITATIONS: carrying, lifting, bending, sitting, standing, squatting, stairs, transfers, continence, bathing, toileting, dressing, reach over head, locomotion level, and caring for others  PARTICIPATION LIMITATIONS: meal  prep, cleaning, laundry, driving, shopping, community activity, occupation, and yard work  PERSONAL FACTORS: Age, Past/current experiences, Time since onset of injury/illness/exacerbation, and 3+ comorbidities: PMH including Left temporal glioblastoma, SDH L, spastic hemiplegia, seizures, visual cut R side are also affecting patient's functional outcome.   REHAB POTENTIAL: Good  CLINICAL DECISION MAKING: Evolving/moderate complexity  EVALUATION COMPLEXITY: Moderate  PLAN:  PT FREQUENCY: 2x/week  PT DURATION: 8 weeks  PLANNED INTERVENTIONS: [id number]- PT Re-evaluation, 97750- Physical Performance Testing, 97110-Therapeutic exercises, 97530- Therapeutic activity, [id number]- Neuromuscular re-education, 97535- Self Care, [id number]- Manual therapy, [id number]- Gait training, [id number]- Orthotic Initial, [id number]- Orthotic/Prosthetic subsequent, [id number]- Aquatic Therapy, [phone]- Electrical stimulation (manual), [phone]- Ultrasound, Patient/Family education, Balance training, Stair training, Joint mobilization, DME instructions, Cryotherapy, and Moist heat  PLAN FOR NEXT SESSION: Review/update HEP; LE strengthening; SCI-FIT for strengthening and reciprocal movement; SLS activities; balance; increase R LE step length and foot clearance; leg press?   Damien Caulk, PT 12/01/2023, 3:32 PM

## 2023-12-04 ENCOUNTER — Ambulatory Visit: Admitting: Physical Therapy

## 2023-12-04 ENCOUNTER — Encounter: Payer: Self-pay | Admitting: Physical Therapy

## 2023-12-04 VITALS — BP 122/82 | HR 85

## 2023-12-04 DIAGNOSIS — R262 Difficulty in walking, not elsewhere classified: Secondary | ICD-10-CM

## 2023-12-04 DIAGNOSIS — R29818 Other symptoms and signs involving the nervous system: Secondary | ICD-10-CM

## 2023-12-04 DIAGNOSIS — C719 Malignant neoplasm of brain, unspecified: Secondary | ICD-10-CM

## 2023-12-04 DIAGNOSIS — R260 Ataxic gait: Secondary | ICD-10-CM | POA: Diagnosis not present

## 2023-12-04 NOTE — Therapy (Signed)
 OUTPATIENT PHYSICAL THERAPY NEURO TREATMENT   Patient Name: [name] MRN: [id number] DOB:01/12/1961, 62 y.o., male Today's Date: 12/04/2023   PCP: Dwight Trula SQUIBB, MD REFERRING PROVIDER: Camalier, Curry Scarce, NP  END OF SESSION:  PT End of Session - 12/04/23 1231     Visit Number 3    Number of Visits 17   16 plus Eval   Date for Recertification  01/23/24    Authorization Type [employer]    Authorization Time Period Auth Pending    PT Start Time 1230    PT Stop Time 1313    PT Time Calculation (min) 43 min    Equipment Utilized During Treatment Gait belt    Activity Tolerance Patient tolerated treatment well    Behavior During Therapy WFL for tasks assessed/performed           Past Medical History:  Diagnosis Date   ALLERGIC RHINITIS 01/09/2009   Glioblastoma multiforme (HCC)    stage IV   HYPERTENSION 01/09/2009   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   Left Temple   SEIZURE DISORDER 01/09/2009   Thyroid  disease    Hypothyroidism   Past Surgical History:  Procedure Laterality Date   CRANIOTOMY  2009   glioblastoma   Patient Active Problem List   Diagnosis Date Noted   Hypothyroidism 04/28/2015   GBM (glioblastoma multiforme) (HCC) 01/03/2011   Essential hypertension 01/09/2009   Allergic rhinitis 01/09/2009   SEIZURE DISORDER 01/09/2009    ONSET DATE: 2010 Stage 4 brain CA per pt report  REFERRING DIAG: C71.2 (ICD-10-CM) - Malignant neoplasm of temporal lobe  THERAPY DIAG:  Ataxic gait  Difficulty in walking, not elsewhere classified  Other symptoms and signs involving the nervous system  GBM (glioblastoma multiforme) (HCC)  Rationale for Evaluation and Treatment: Rehabilitation  SUBJECTIVE:                                                                                                                                                                                             SUBJECTIVE STATEMENT: Pt reports going to  [place] for a couple days and did a lot of walking (also did HEP) and is tired (returned yesterday).  No recent falls. 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  PMH also includes: Spastic hemiplegia affecting right dominant side (CMS/HHS-HCC), sub-acute Cerebral infarct (CMS-HCC), seizures, htn, anemia, asthma, hypopotassemia.  Pt also  reports having visual cut R side.  PAIN:  Are you having pain? Yes: NPRS scale: 0/10 currently Pain location: R UE/LE Pain description: stiff; tight Aggravating factors: not exercising or exercising too much Relieving factors: movement (not too much or too little)  PRECAUTIONS: Fall; R field visual cut  RED FLAGS: None   WEIGHT BEARING RESTRICTIONS: No  FALLS: Has patient fallen in last 6 months? No  LIVING ENVIRONMENT: Lives with: lives with their spouse Lives in: House/apartment Stairs: No Has following equipment at home: Single point cane, Quad cane small base, Walker - 2 wheeled, Crutches, shower chair, bed side commode, and Grab bars  PLOF: Independent with gait and Independent with transfers; assist putting on the belt, coat, and tie (extra time/effort to perform on own)  PATIENT GOALS: 5 years from now I still want to be walking.  Improve walking pattern and balance.  Don't want to regress; want to get better.  OBJECTIVE:  Note: Objective measures were completed at Evaluation unless otherwise noted.  DIAGNOSTIC FINDINGS: 11/11/23 MRI Brain with and without contrast: IMPRESSION: Stable postoperative changes within the left temporal lobe. Compared to 07/29/2023, similar enhancement along the resection margin and inferomedial left temporal lobe. No evidence of local recurrence or  metastatic disease within the brain. BT-RADS 2a.   COGNITION: Overall cognitive status: Within functional limits for tasks assessed   SENSATION: Not tested (pt reports no concerns)  COORDINATION: Impaired R LE heel to shin in sitting  MUSCLE TONE: RLE: Hypertonic  POSTURE: rounded shoulders and forward head  LOWER EXTREMITY ROM:     Active  Right Eval Left Eval  Hip flexion [facility]  Hip extension    Hip abduction [facility]  Hip adduction    Hip internal rotation    Hip external rotation    Knee flexion [facility] WFL  Knee extension [facility] WFL  Ankle dorsiflexion WFL (*PROM) WFL  Ankle plantarflexion    Ankle inversion    Ankle eversion     (Blank rows = not tested)  LOWER EXTREMITY MMT:    MMT Right Eval Left Eval  Hip flexion 4+/5 5/5  Hip extension    Hip abduction    Hip adduction    Hip internal rotation    Hip external rotation    Knee flexion 4-/5 5/5  Knee extension 4-/5 5/5  Ankle dorsiflexion 2+/5 5/5  Ankle plantarflexion 2+/5 >3/5  Ankle inversion    Ankle eversion    (Blank rows = not tested)  BED MOBILITY:  Not tested  Pt reports being independent at home  TRANSFERS:  Requires L UE support Sit to stand: SBA  Assistive device utilized: None     Stand to sit: SBA  Assistive device utilized: None     Chair to chair: SBA  Assistive device utilized: None       STAIRS: Not tested.  Pt reports able to do stairs with use of railing. GAIT: Findings: Gait Characteristics: step through pattern, decreased arm swing- Right, decreased arm swing- Left, decreased step length- Right, decreased step length- Left, decreased stance time- Right, decreased stride length, decreased hip/knee flexion- Right, decreased ankle dorsiflexion- Right, circumduction- Right, Right foot flat, ataxic, decreased trunk rotation, and poor foot clearance- Right, Distance walked: Clinic distances, Assistive device utilized:None, Level of assistance: SBA, and Comments: R LE externally  rotated; R UE flexed and at side; increased L lateral weight shift  FUNCTIONAL TESTS:  5 times sit to stand: 14.19 seconds (decreased control/quality of movement R LE  3rd-5th stand); use of L UE; on Eval Timed up and go (TUG): 12.35 seconds (did trial run first); on Eval 6 minute walk test: 671 feet (decreased R foot clearance and gait speed with increased distance ambulating) 12/01/23 Functional gait assessment: 11/30 (12/01/23)  PATIENT SURVEYS:  ABC scale: TBA  VITALS: Vitals:   12/04/23 1233  BP: 122/82  Pulse: 85                                                                                                                                 TREATMENT DATE: 12/04/23  Self Care: BP and HR taken in sitting at rest beginning of session (see above for details)  Therapeutic Exercise: SciFit multi-peaks up to level 5 for 8 minutes using BUE/BLEs for neural priming for reciprocal movement, dynamic cardiovascular warmup and increased amplitude of stepping. RPE of 2/10 following activity.  Cueing for positioning of R LE intermittently (tends to ER).  Pt reports R UE/LE felt better after (feels loosened up).  No pain reported.  Therapeutic Activity: Toe taps to 6 inch step: 2x10 reps (no UE support) toe taps L LE--CGA to min assist for balance  2x10 reps (L UE support) toe taps R LE; intermittent therapist assist for hip and knee flexion to avoid circumduction; CGA A[employer] static standing:  2x1 minute; eyes open; feet shoulder width apart; CGA 3x30 seconds; eyes closed; feet shoulder width apart; CGA to occasional min assist to steady; pt appearing shaky with eyes closed with increased ankle strategies noted to keep balance    PATIENT EDUCATION: 12/04/23 Education details: Role of ex's and activities to improve strength, reciprocal LE movement, balance, and R LE foot clearance/step length for safe ambulation Person educated: Patient Education method: Explanation, Demonstration, Verbal  cues, and Handouts Education comprehension: verbalized understanding, returned demonstration, and verbal cues required  HOME EXERCISE PROGRAM: 12/01/23 Access Code: [email or web address] URL: [email or web address] Date: 12/01/2023 Prepared by: Damien Caulk  Exercises - Standing March with Counter Support  - 1 x daily - 5-7 x weekly - 3 sets - 10 reps - Standing Hip Abduction with Unilateral Counter Support  - 1 x daily - 5 x weekly - 2 sets - 5 reps - Heel Raises with Counter Support  - 1 x daily - 5-7 x weekly - 3 sets - 10 reps  GOALS: Goals reviewed with patient? Yes  SHORT TERM GOALS: Target date: 12/26/23  Pt will be independent with initial HEP in order to improve strength and balance in order to decrease fall risk and improve function at home for ADL's and at work.  Baseline: Goal status: INITIAL  2.  Assess (to demonstrate improvement in cardiopulmonary endurance and community ambulation).  Baseline: 671 feet 12/01/23 Goal status: MET  3.  Assess Functional Gait Assessment (to reduce fall risk and improve dynamic gait safety with community ambulation). Baseline: 11/30 (12/01/23) Goal status: MET  4.  Pt will demonstrate improved R foot clearance  for safety with gait.  Baseline:  Goal status: INITIAL    LONG TERM GOALS: Target date: 01/23/24  Pt will decrease 5 Time Sit to Stand by at least 3 seconds (and quality of movement) in order to demonstrate clinically significant improvement in LE strength.  Baseline: 14.19 seconds (decreased control/quality of movement R LE 3rd-5th stand; use of L UE) 11/24/23 Goal status: INITIAL  2.  Pt will be independent with final HEP in order to improve strength and balance in order to decrease fall risk and improve function at home for ADL's and at work.  Baseline:  Goal status: INITIAL  3.  Patient will be independent in bending down towards floor and picking up small object (<5 pounds) and then stand back up without  loss of balance as to improve ability to pick up and clean up room at home.  Baseline:  Goal status: INITIAL  4.  Pt will increase by at least 3m (184ft) in order to demonstrate clinically significant improvement in cardiopulmonary endurance and community ambulation.  Baseline: 671 feet 12/01/23 Goal status: INITIAL  5.  Patient will increase Functional Gait Assessment score to >20/30 as to reduce fall risk and improve dynamic gait safety with community ambulation.  Baseline: 11/30 (12/01/23) Goal status: INITIAL   ASSESSMENT:  CLINICAL IMPRESSION: Patient was seen today for physical therapy treatment to address strength, balance, and ambulation.  Focused session on Sci-fit to improve reciprocal LE movement for ambulation and for strengthening/endurance, standing toe taps to 6 inch step to improve single leg stability/balance for functional mobility and to improve R LE step length/foot clearance, and standing balance on compliant surface (eyes open and closed) to improve balance on uneven surfaces.  Patient continues to be limited by R sided weakness, balance, and gait impairments.  They demonstrate improvement in R LE foot clearance end of session with ambulation.  They would continue to benefit from skilled PT to address impairments as noted and progress towards long term goals.    OBJECTIVE IMPAIRMENTS: Abnormal gait, decreased activity tolerance, decreased balance, decreased cognition, decreased coordination, decreased endurance, decreased knowledge of condition, decreased knowledge of use of DME, decreased mobility, difficulty walking, decreased strength, impaired tone, and impaired UE functional use.   ACTIVITY LIMITATIONS: carrying, lifting, bending, sitting, standing, squatting, stairs, transfers, continence, bathing, toileting, dressing, reach over head, locomotion level, and caring for others  PARTICIPATION LIMITATIONS: meal prep, cleaning, laundry, driving, shopping, community  activity, occupation, and yard work  PERSONAL FACTORS: Age, Past/current experiences, Time since onset of injury/illness/exacerbation, and 3+ comorbidities: PMH including Left temporal glioblastoma, SDH L, spastic hemiplegia, seizures, visual cut R side are also affecting patient's functional outcome.   REHAB POTENTIAL: Good  CLINICAL DECISION MAKING: Evolving/moderate complexity  EVALUATION COMPLEXITY: Moderate  PLAN:  PT FREQUENCY: 2x/week  PT DURATION: 8 weeks  PLANNED INTERVENTIONS: [id number]- PT Re-evaluation, 97750- Physical Performance Testing, 97110-Therapeutic exercises, 97530- Therapeutic activity, [id number]- Neuromuscular re-education, 97535- Self Care, [id number]- Manual therapy, [id number]- Gait training, [id number]- Orthotic Initial, [id number]- Orthotic/Prosthetic subsequent, [phone]- Aquatic Therapy, [phone]- Electrical stimulation (manual), [phone]- Ultrasound, Patient/Family education, Balance training, Stair training, Joint mobilization, DME instructions, Cryotherapy, and Moist heat  PLAN FOR NEXT SESSION: Review/update HEP; LE strengthening; SCI-FIT for strengthening and reciprocal movement; SLS activities; balance; increase R LE step length and foot clearance; leg press?   Damien Caulk, PT 12/04/2023, 3:53 PM

## 2023-12-08 ENCOUNTER — Encounter: Payer: Self-pay | Admitting: Physical Therapy

## 2023-12-08 ENCOUNTER — Ambulatory Visit: Admitting: Physical Therapy

## 2023-12-08 VITALS — BP 131/79 | HR 87

## 2023-12-08 DIAGNOSIS — R260 Ataxic gait: Secondary | ICD-10-CM | POA: Diagnosis not present

## 2023-12-08 DIAGNOSIS — C719 Malignant neoplasm of brain, unspecified: Secondary | ICD-10-CM

## 2023-12-08 DIAGNOSIS — R29818 Other symptoms and signs involving the nervous system: Secondary | ICD-10-CM

## 2023-12-08 DIAGNOSIS — R262 Difficulty in walking, not elsewhere classified: Secondary | ICD-10-CM

## 2023-12-08 NOTE — Therapy (Signed)
 OUTPATIENT PHYSICAL THERAPY NEURO TREATMENT   Patient Name: [name] MRN: [id number] DOB:11-26-1960, 63 y.o., male Today's Date: 12/08/2023   PCP: Dwight Trula SQUIBB, MD REFERRING PROVIDER: Camalier, Curry Scarce, NP  END OF SESSION:  PT End of Session - 12/08/23 1408     Visit Number 4    Number of Visits 17   16 plus Eval   Date for Recertification  01/23/24    Authorization Type [employer]    Authorization Time Period 12/01/23 - 01/26/24    Authorization - Visit Number 4    Authorization - Number of Visits 17    PT Start Time 1407   Therapist running late   PT Stop Time 1450    PT Time Calculation (min) 43 min    Equipment Utilized During Treatment Gait belt    Activity Tolerance Patient tolerated treatment well    Behavior During Therapy WFL for tasks assessed/performed           Past Medical History:  Diagnosis Date   ALLERGIC RHINITIS 01/09/2009   Glioblastoma multiforme (HCC)    stage IV   HYPERTENSION 01/09/2009   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   Left Temple   SEIZURE DISORDER 01/09/2009   Thyroid  disease    Hypothyroidism   Past Surgical History:  Procedure Laterality Date   CRANIOTOMY  2009   glioblastoma   Patient Active Problem List   Diagnosis Date Noted   Hypothyroidism 04/28/2015   GBM (glioblastoma multiforme) (HCC) 01/03/2011   Essential hypertension 01/09/2009   Allergic rhinitis 01/09/2009   SEIZURE DISORDER 01/09/2009    ONSET DATE: 2010 Stage 4 brain CA per pt report  REFERRING DIAG: C71.2 (ICD-10-CM) - Malignant neoplasm of temporal lobe  THERAPY DIAG:  Ataxic gait  Difficulty in walking, not elsewhere classified  Other symptoms and signs involving the nervous system  GBM (glioblastoma multiforme) (HCC)  Rationale for Evaluation and Treatment: Rehabilitation  SUBJECTIVE:                                                                                                                                                                                              SUBJECTIVE STATEMENT: Pt reports having more energy since starting physical therapy.  No recent falls. 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  PMH also includes: Spastic hemiplegia affecting right dominant side (CMS/HHS-HCC), sub-acute Cerebral infarct (CMS-HCC), seizures,  htn, anemia, asthma, hypopotassemia.  Pt also reports having visual cut R side.  PAIN:  Are you having pain? Yes: NPRS scale: 0/10 currently Pain location: R UE/LE Pain description: stiff; tight Aggravating factors: not exercising or exercising too much Relieving factors: movement (not too much or too little)  PRECAUTIONS: Fall; R field visual cut  RED FLAGS: None   WEIGHT BEARING RESTRICTIONS: No  FALLS: Has patient fallen in last 6 months? No  LIVING ENVIRONMENT: Lives with: lives with their spouse Lives in: House/apartment Stairs: No Has following equipment at home: Single point cane, Quad cane small base, Walker - 2 wheeled, Crutches, shower chair, bed side commode, and Grab bars  PLOF: Independent with gait and Independent with transfers; assist putting on the belt, coat, and tie (extra time/effort to perform on own)  PATIENT GOALS: 5 years from now I still want to be walking.  Improve walking pattern and balance.  Don't want to regress; want to get better.  OBJECTIVE:  Note: Objective measures were completed at Evaluation unless otherwise noted.  DIAGNOSTIC FINDINGS: 11/11/23 MRI Brain with and without contrast: IMPRESSION: Stable postoperative changes within the left temporal lobe. Compared to 07/29/2023, similar enhancement along the resection margin and inferomedial left temporal lobe. No  evidence of local recurrence or metastatic disease within the brain. BT-RADS 2a.   COGNITION: Overall cognitive status: Within functional limits for tasks assessed   SENSATION: Not tested (pt reports no concerns)  COORDINATION: Impaired R LE heel to shin in sitting  MUSCLE TONE: RLE: Hypertonic  POSTURE: rounded shoulders and forward head  LOWER EXTREMITY ROM:     Active  Right Eval Left Eval  Hip flexion [facility] [address]  Hip extension    Hip abduction [facility]  Hip adduction    Hip internal rotation    Hip external rotation    Knee flexion [facility] WFL  Knee extension [facility] WFL  Ankle dorsiflexion WFL (*PROM) WFL  Ankle plantarflexion    Ankle inversion    Ankle eversion     (Blank rows = not tested)  LOWER EXTREMITY MMT:    MMT Right Eval Left Eval  Hip flexion 4+/5 5/5  Hip extension    Hip abduction    Hip adduction    Hip internal rotation    Hip external rotation    Knee flexion 4-/5 5/5  Knee extension 4-/5 5/5  Ankle dorsiflexion 2+/5 5/5  Ankle plantarflexion 2+/5 >3/5  Ankle inversion    Ankle eversion    (Blank rows = not tested)  BED MOBILITY:  Not tested  Pt reports being independent at home  TRANSFERS:  Requires L UE support Sit to stand: SBA  Assistive device utilized: None     Stand to sit: SBA  Assistive device utilized: None     Chair to chair: SBA  Assistive device utilized: None       STAIRS: Not tested.  Pt reports able to do stairs with use of railing. GAIT: Findings: Gait Characteristics: step through pattern, decreased arm swing- Right, decreased arm swing- Left, decreased step length- Right, decreased step length- Left, decreased stance time- Right, decreased stride length, decreased hip/knee flexion- Right, decreased ankle dorsiflexion- Right, circumduction- Right, Right foot flat, ataxic, decreased trunk rotation, and poor foot clearance- Right, Distance walked: Clinic distances, Assistive device utilized:None, Level of assistance: SBA, and  Comments: R LE externally rotated; R UE flexed and at side; increased L lateral weight shift  FUNCTIONAL TESTS:  5 times sit to stand: 14.19  seconds (decreased control/quality of movement R LE 3rd-5th stand); use of L UE; on Eval Timed up and go (TUG): 12.35 seconds (did trial run first); on Eval 6 minute walk test: 671 feet (decreased R foot clearance and gait speed with increased distance ambulating) 12/01/23 Functional gait assessment: 11/30 (12/01/23)  PATIENT SURVEYS:  ABC scale: TBA  VITALS: Vitals:   12/08/23 1410  BP: 131/79  Pulse: 87                                                                                                                               TREATMENT DATE: 12/08/23  SELF CARE: BP and HR taken in sitting at rest beginning of session (see above for details).  THERAPEUTIC EXERCISE: SciFit multi-peaks up to level 5 for 8 minutes using BUE/BLEs for neural priming for reciprocal movement, dynamic cardiovascular warmup and increased amplitude of stepping. RPE of 3/10 following activity.  Improved R LE positioning noted this session.  THERAPEUTIC ACTIVITY: Sit to stand (no UE support) on Airex; 10 reps x3 sets; Level of assist: CGA to min assist; Notes: Provided tactile input downward through R knee just prior to standing to give feedback to increase WB'ing through R LE/activation of R LE Standing toe taps B LE's to 6 inch step with 3# weights B ankles; 10 reps x2 sets; Level of assist: CGA to min assist for balance; no UE support; Notes: vc's and assist to prevent R LE circumduction and increase R hip and knee flexion Sidestepping R/L x6 feet each way x2 trials; foam beam (compliant surface); Level of assist: CGA; Notes: Increased use of hip and ankle strategies noted   PATIENT EDUCATION: 12/08/23 Education details:  Discussed option of gym membership (pt reporting Sci[employer] feeling beneficial) Person educated: Patient Education method: Explanation, Demonstration,  Verbal cues, and Handouts Education comprehension: verbalized understanding, returned demonstration, and verbal cues required  HOME EXERCISE PROGRAM: 12/01/23 Access Code: [email or web address] URL: [email or web address] Date: 12/01/2023 Prepared by: Damien Caulk  Exercises - Standing March with Counter Support  - 1 x daily - 5-7 x weekly - 3 sets - 10 reps - Standing Hip Abduction with Unilateral Counter Support  - 1 x daily - 5 x weekly - 2 sets - 5 reps - Heel Raises with Counter Support  - 1 x daily - 5-7 x weekly - 3 sets - 10 reps  GOALS: Goals reviewed with patient? Yes  SHORT TERM GOALS: Target date: 12/26/23  Pt will be independent with initial HEP in order to improve strength and balance in order to decrease fall risk and improve function at home for ADL's and at work.  Baseline: Goal status: INITIAL  2.  Assess (to demonstrate improvement in cardiopulmonary endurance and community ambulation).  Baseline: 671 feet 12/01/23 Goal status: MET  3.  Assess Functional Gait Assessment (to reduce fall risk and improve dynamic gait safety with community ambulation). Baseline: 11/30 (12/01/23) Goal status: MET  4.  Pt  will demonstrate improved R foot clearance for safety with gait.  Baseline:  Goal status: INITIAL    LONG TERM GOALS: Target date: 01/23/24  Pt will decrease 5 Time Sit to Stand by at least 3 seconds (and quality of movement) in order to demonstrate clinically significant improvement in LE strength.  Baseline: 14.19 seconds (decreased control/quality of movement R LE 3rd-5th stand; use of L UE) 11/24/23 Goal status: INITIAL  2.  Pt will be independent with final HEP in order to improve strength and balance in order to decrease fall risk and improve function at home for ADL's and at work.  Baseline:  Goal status: INITIAL  3.  Patient will be independent in bending down towards floor and picking up small object (<5 pounds) and then stand back up  without loss of balance as to improve ability to pick up and clean up room at home.  Baseline:  Goal status: INITIAL  4.  Pt will increase by at least 60m (147ft) in order to demonstrate clinically significant improvement in cardiopulmonary endurance and community ambulation.  Baseline: 671 feet 12/01/23 Goal status: INITIAL  5.  Patient will increase Functional Gait Assessment score to >20/30 as to reduce fall risk and improve dynamic gait safety with community ambulation.  Baseline: 11/30 (12/01/23) Goal status: INITIAL   ASSESSMENT:  CLINICAL IMPRESSION: Patient was seen today for physical therapy treatment to address strength, aerobic endurance, and balance.  Focused session on LE strengthening, sit to stands on compliant surface (to improve use of R LE during transfers), SLS balance, and dynamic balance on compliant surfaces.  Patient continues to be limited by R LE weakness.  They demonstrate improvement in use of R LE during transfers with feedback.  They would continue to benefit from skilled PT to address impairments as noted and progress towards long term goals.  OBJECTIVE IMPAIRMENTS: Abnormal gait, decreased activity tolerance, decreased balance, decreased cognition, decreased coordination, decreased endurance, decreased knowledge of condition, decreased knowledge of use of DME, decreased mobility, difficulty walking, decreased strength, impaired tone, and impaired UE functional use.   ACTIVITY LIMITATIONS: carrying, lifting, bending, sitting, standing, squatting, stairs, transfers, continence, bathing, toileting, dressing, reach over head, locomotion level, and caring for others  PARTICIPATION LIMITATIONS: meal prep, cleaning, laundry, driving, shopping, community activity, occupation, and yard work  PERSONAL FACTORS: Age, Past/current experiences, Time since onset of injury/illness/exacerbation, and 3+ comorbidities: PMH including Left temporal glioblastoma, SDH L, spastic  hemiplegia, seizures, visual cut R side are also affecting patient's functional outcome.   REHAB POTENTIAL: Good  CLINICAL DECISION MAKING: Evolving/moderate complexity  EVALUATION COMPLEXITY: Moderate  PLAN:  PT FREQUENCY: 2x/week  PT DURATION: 8 weeks  PLANNED INTERVENTIONS: [id number]- PT Re-evaluation, 97750- Physical Performance Testing, 97110-Therapeutic exercises, 97530- Therapeutic activity, [id number]- Neuromuscular re-education, 97535- Self Care, [id number]- Manual therapy, [id number]- Gait training, [id number]- Orthotic Initial, [id number]- Orthotic/Prosthetic subsequent, [id number]- Aquatic Therapy, [phone]- Electrical stimulation (manual), [phone]- Ultrasound, Patient/Family education, Balance training, Stair training, Joint mobilization, DME instructions, Cryotherapy, and Moist heat  PLAN FOR NEXT SESSION: Review/update HEP; LE strengthening; SCI-FIT for strengthening and reciprocal movement; SLS activities; balance; increase R LE step length and foot clearance; leg press?   Damien Caulk, PT 12/08/2023, 7:46 PM

## 2023-12-11 ENCOUNTER — Ambulatory Visit: Admitting: Physical Therapy

## 2023-12-11 ENCOUNTER — Encounter: Payer: Self-pay | Admitting: Physical Therapy

## 2023-12-11 VITALS — BP 118/84 | HR 93

## 2023-12-11 DIAGNOSIS — R262 Difficulty in walking, not elsewhere classified: Secondary | ICD-10-CM

## 2023-12-11 DIAGNOSIS — R260 Ataxic gait: Secondary | ICD-10-CM | POA: Diagnosis not present

## 2023-12-11 DIAGNOSIS — C719 Malignant neoplasm of brain, unspecified: Secondary | ICD-10-CM

## 2023-12-11 DIAGNOSIS — R350 Frequency of micturition: Secondary | ICD-10-CM | POA: Diagnosis not present

## 2023-12-11 DIAGNOSIS — R29818 Other symptoms and signs involving the nervous system: Secondary | ICD-10-CM

## 2023-12-11 NOTE — Therapy (Signed)
 OUTPATIENT PHYSICAL THERAPY NEURO TREATMENT   Patient Name: [name] MRN: [id number] DOB:Sep 23, 1960, 63 y.o., male Today's Date: 12/11/2023   PCP: Dwight Trula SQUIBB, MD REFERRING PROVIDER: Camalier, Curry Scarce, NP  END OF SESSION:  PT End of Session - 12/11/23 1410     Visit Number 5    Number of Visits 17   16 plus Eval   Date for Recertification  01/23/24    Authorization Type [employer]    Authorization Time Period 12/01/23 - 01/26/24    Authorization - Visit Number 5    Authorization - Number of Visits 17    Progress Note Due on Visit 10    PT Start Time 1408    PT Stop Time 1500    PT Time Calculation (min) 52 min    Equipment Utilized During Treatment Gait belt    Activity Tolerance Patient tolerated treatment well;Patient limited by fatigue    Behavior During Therapy WFL for tasks assessed/performed           Past Medical History:  Diagnosis Date   ALLERGIC RHINITIS 01/09/2009   Glioblastoma multiforme (HCC)    stage IV   HYPERTENSION 01/09/2009   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   Left Temple   SEIZURE DISORDER 01/09/2009   Thyroid  disease    Hypothyroidism   Past Surgical History:  Procedure Laterality Date   CRANIOTOMY  2009   glioblastoma   Patient Active Problem List   Diagnosis Date Noted   Hypothyroidism 04/28/2015   GBM (glioblastoma multiforme) (HCC) 01/03/2011   Essential hypertension 01/09/2009   Allergic rhinitis 01/09/2009   SEIZURE DISORDER 01/09/2009    ONSET DATE: 2010 Stage 4 brain CA per pt report  REFERRING DIAG: C71.2 (ICD-10-CM) - Malignant neoplasm of temporal lobe  THERAPY DIAG:  Ataxic gait  Difficulty in walking, not elsewhere classified  Other symptoms and signs involving the nervous system  GBM (glioblastoma multiforme) (HCC)  Rationale for Evaluation and Treatment: Rehabilitation  SUBJECTIVE:                                                                                                                                                                                              SUBJECTIVE STATEMENT: No recent falls reported.  Saw urologist this morning (had test with contrast to look at bladder); test results pending.  Has been busy today and is tired but wants to do therapy.  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  PMH also includes: Spastic hemiplegia affecting right dominant side (CMS/HHS-HCC), sub-acute Cerebral infarct (CMS-HCC), seizures, htn, anemia, asthma, hypopotassemia.  Pt also reports having visual cut R side.  PAIN:  Are you having pain? Yes: NPRS scale: 3/10 currently Pain location: R UE Pain description: stiff; tight Aggravating factors: not exercising or exercising too much Relieving factors: movement (not too much or too little)  PRECAUTIONS: Fall; R field visual cut  RED FLAGS: None   WEIGHT BEARING RESTRICTIONS: No  FALLS: Has patient fallen in last 6 months? No  LIVING ENVIRONMENT: Lives with: lives with their spouse Lives in: House/apartment Stairs: No Has following equipment at home: Single point cane, Quad cane small base, Walker - 2 wheeled, Crutches, shower chair, bed side commode, and Grab bars  PLOF: Independent with gait and Independent with transfers; assist putting on the belt, coat, and tie (extra time/effort to perform on own)  PATIENT GOALS: 5 years from now I still want to be walking.  Improve walking pattern and balance.  Don't want to regress; want to get better.  OBJECTIVE:  Note: Objective measures were completed at Evaluation unless otherwise noted.  DIAGNOSTIC FINDINGS: 11/11/23 MRI Brain with and without  contrast: IMPRESSION: Stable postoperative changes within the left temporal lobe. Compared to 07/29/2023, similar enhancement along the resection margin and inferomedial left temporal lobe. No evidence of local recurrence or metastatic disease within the brain. BT-RADS 2a.   COGNITION: Overall cognitive status: Within functional limits for tasks assessed   SENSATION: Not tested (pt reports no concerns)  COORDINATION: Impaired R LE heel to shin in sitting  MUSCLE TONE: RLE: Hypertonic  POSTURE: rounded shoulders and forward head  LOWER EXTREMITY ROM:     Active  Right Eval Left Eval  Hip flexion [facility]  Hip extension    Hip abduction [facility]  Hip adduction    Hip internal rotation    Hip external rotation    Knee flexion [facility] WFL  Knee extension [facility] WFL  Ankle dorsiflexion WFL (*PROM) WFL  Ankle plantarflexion    Ankle inversion    Ankle eversion     (Blank rows = not tested)  LOWER EXTREMITY MMT:    MMT Right Eval Left Eval  Hip flexion 4+/5 5/5  Hip extension    Hip abduction    Hip adduction    Hip internal rotation    Hip external rotation    Knee flexion 4-/5 5/5  Knee extension 4-/5 5/5  Ankle dorsiflexion 2+/5 5/5  Ankle plantarflexion 2+/5 >3/5  Ankle inversion    Ankle eversion    (Blank rows = not tested)  BED MOBILITY:  Not tested  Pt reports being independent at home  TRANSFERS:  Requires L UE support Sit to stand: SBA  Assistive device utilized: None     Stand to sit: SBA  Assistive device utilized: None     Chair to chair: SBA  Assistive device utilized: None       STAIRS: Not tested.  Pt reports able to do stairs with use of railing. GAIT: Findings: Gait Characteristics: step through pattern, decreased arm swing- Right, decreased arm swing- Left, decreased step length- Right, decreased step length- Left, decreased stance time- Right, decreased stride length, decreased hip/knee flexion- Right, decreased ankle dorsiflexion- Right,  circumduction- Right, Right foot flat, ataxic, decreased trunk rotation, and poor foot  clearance- Right, Distance walked: Clinic distances, Assistive device utilized:None, Level of assistance: SBA, and Comments: R LE externally rotated; R UE flexed and at side; increased L lateral weight shift  FUNCTIONAL TESTS:  5 times sit to stand: 14.19 seconds (decreased control/quality of movement R LE 3rd-5th stand); use of L UE; on Eval Timed up and go (TUG): 12.35 seconds (did trial run first); on Eval 6 minute walk test: 671 feet (decreased R foot clearance and gait speed with increased distance ambulating) 12/01/23 Functional gait assessment: 11/30 (12/01/23)  PATIENT SURVEYS:  ABC scale: TBA                                                                                                                              TREATMENT DATE: 12/11/23 Self Care: BP and HR taken in sitting at rest beginning/end of session (see below for details) Vitals:   12/11/23 1412  BP: 118/84  Pulse: 93  Beginning of session (see above) End of session BP 126/82 with HR 94 bpm  Therapeutic Exercise: SciFit multi-peaks up to level 5 for 8 minutes using BUE/BLEs for neural priming for reciprocal movement, dynamic cardiovascular warmup and increased amplitude of stepping. RPE of 3-4/10 following activity.  84 bpm HR post activity  Therapeutic Activity: Ladder walking: Focusing on advancing R LE with improved R foot clearance and step length; 10 feet x6 reps; min assist for R LE hip rotation forward when stepping with R LE which improved gait technique Step ups: x10 reps B LE's; L UE support on railing; 6 inch step; CGA; more difficulty with R LE compared to L LE Standing toe taps: 6 inch step; B LE's; no UE support; CGA; more unsteady with L toe taps (anticipate d/t impaired R LE SLS balance); HR 100 bpm post activity   PATIENT EDUCATION: 12/11/23 Education details:  Reviewed HEP (pt requested not to upgrade ex's at  this time d/t being tired). Person educated: Patient Education method: Explanation and Verbal cues Education comprehension: verbalized understanding  HOME EXERCISE PROGRAM: 12/01/23 Access Code: [email or web address] URL: [email or web address] Date: 12/01/2023 Prepared by: Damien Caulk  Exercises - Standing March with Counter Support  - 1 x daily - 5-7 x weekly - 3 sets - 10 reps - Standing Hip Abduction with Unilateral Counter Support  - 1 x daily - 5 x weekly - 2 sets - 5 reps - Heel Raises with Counter Support  - 1 x daily - 5-7 x weekly - 3 sets - 10 reps  GOALS: Goals reviewed with patient? Yes  SHORT TERM GOALS: Target date: 12/26/23  Pt will be independent with initial HEP in order to improve strength and balance in order to decrease fall risk and improve function at home for ADL's and at work.  Baseline: Goal status: INITIAL  2.  Assess (to demonstrate improvement in cardiopulmonary endurance and community ambulation).  Baseline: 671 feet 12/01/23 Goal status: MET  3.  Assess Functional Gait Assessment (to  reduce fall risk and improve dynamic gait safety with community ambulation). Baseline: 11/30 (12/01/23) Goal status: MET  4.  Pt will demonstrate improved R foot clearance for safety with gait.  Baseline:  Goal status: INITIAL    LONG TERM GOALS: Target date: 01/23/24  Pt will decrease 5 Time Sit to Stand by at least 3 seconds (and quality of movement) in order to demonstrate clinically significant improvement in LE strength.  Baseline: 14.19 seconds (decreased control/quality of movement R LE 3rd-5th stand; use of L UE) 11/24/23 Goal status: INITIAL  2.  Pt will be independent with final HEP in order to improve strength and balance in order to decrease fall risk and improve function at home for ADL's and at work.  Baseline:  Goal status: INITIAL  3.  Patient will be independent in bending down towards floor and picking up small object (<5 pounds)  and then stand back up without loss of balance as to improve ability to pick up and clean up room at home.  Baseline:  Goal status: INITIAL  4.  Pt will increase by at least 68m (136ft) in order to demonstrate clinically significant improvement in cardiopulmonary endurance and community ambulation.  Baseline: 671 feet 12/01/23 Goal status: INITIAL  5.  Patient will increase Functional Gait Assessment score to >20/30 as to reduce fall risk and improve dynamic gait safety with community ambulation.  Baseline: 11/30 (12/01/23) Goal status: INITIAL   ASSESSMENT:  CLINICAL IMPRESSION: Patient was seen today for physical therapy treatment to address strength, balance, and ambulation.  Focused session on LE strengthening, aerobic endurance, balance, and improving R LE step length and foot clearance for ambulation.  Patient continues to be limited by R LE weakness and balance.  Pt appearing tired today requiring pacing, rest breaks between activities, and activity modification.  They demonstrate improvement in R LE step length and foot clearance for ambulation with tactile cueing to rotate R hip forward during R LE advancement.  They would continue to benefit from skilled PT to address impairments as noted and progress towards long term goals.  OBJECTIVE IMPAIRMENTS: Abnormal gait, decreased activity tolerance, decreased balance, decreased cognition, decreased coordination, decreased endurance, decreased knowledge of condition, decreased knowledge of use of DME, decreased mobility, difficulty walking, decreased strength, impaired tone, and impaired UE functional use.   ACTIVITY LIMITATIONS: carrying, lifting, bending, sitting, standing, squatting, stairs, transfers, continence, bathing, toileting, dressing, reach over head, locomotion level, and caring for others  PARTICIPATION LIMITATIONS: meal prep, cleaning, laundry, driving, shopping, community activity, occupation, and yard work  PERSONAL  FACTORS: Age, Past/current experiences, Time since onset of injury/illness/exacerbation, and 3+ comorbidities: PMH including Left temporal glioblastoma, SDH L, spastic hemiplegia, seizures, visual cut R side are also affecting patient's functional outcome.   REHAB POTENTIAL: Good  CLINICAL DECISION MAKING: Evolving/moderate complexity  EVALUATION COMPLEXITY: Moderate  PLAN:  PT FREQUENCY: 2x/week  PT DURATION: 8 weeks  PLANNED INTERVENTIONS: [id number]- PT Re-evaluation, 97750- Physical Performance Testing, 97110-Therapeutic exercises, 97530- Therapeutic activity, [id number]- Neuromuscular re-education, 97535- Self Care, [id number]- Manual therapy, [id number]- Gait training, [id number]- Orthotic Initial, [phone]- Orthotic/Prosthetic subsequent, [phone]- Aquatic Therapy, [phone]- Electrical stimulation (manual), [phone]- Ultrasound, Patient/Family education, Balance training, Stair training, Joint mobilization, DME instructions, Cryotherapy, and Moist heat  PLAN FOR NEXT SESSION: Review/update HEP (resistance band for hip aBd ex?); ABC Scale; LE strengthening; SCI-FIT for strengthening and reciprocal movement; SLS activities; balance; increase R LE step length and foot clearance; leg press?; tall kneeling?   Damien Caulk, PT 12/11/2023, 3:28 PM

## 2023-12-15 ENCOUNTER — Ambulatory Visit: Admitting: Physical Therapy

## 2023-12-15 ENCOUNTER — Encounter: Payer: Self-pay | Admitting: Physical Therapy

## 2023-12-15 VITALS — BP 125/86 | HR 71

## 2023-12-15 DIAGNOSIS — R29818 Other symptoms and signs involving the nervous system: Secondary | ICD-10-CM

## 2023-12-15 DIAGNOSIS — R262 Difficulty in walking, not elsewhere classified: Secondary | ICD-10-CM

## 2023-12-15 DIAGNOSIS — R260 Ataxic gait: Secondary | ICD-10-CM

## 2023-12-15 DIAGNOSIS — C719 Malignant neoplasm of brain, unspecified: Secondary | ICD-10-CM

## 2023-12-15 NOTE — Therapy (Signed)
 OUTPATIENT PHYSICAL THERAPY NEURO TREATMENT   Patient Name: Bruce LAX MRN: [id number] DOB:1960/07/12, 63 y.o., male Today's Date: 12/15/2023   PCP: Dwight Trula SQUIBB, MD REFERRING PROVIDER: Camalier, Curry Scarce, NP  END OF SESSION:  PT End of Session - 12/15/23 1403     Visit Number 6    Number of Visits 17   16 plus Eval   Date for Recertification  01/23/24    Authorization Type [employer]    Authorization Time Period 12/01/23 - 01/26/24    Authorization - Visit Number 6    Authorization - Number of Visits 17    Progress Note Due on Visit 10    PT Start Time 1401    PT Stop Time 1445    PT Time Calculation (min) 44 min    Equipment Utilized During Treatment Gait belt    Activity Tolerance Patient tolerated treatment well    Behavior During Therapy WFL for tasks assessed/performed           Past Medical History:  Diagnosis Date   ALLERGIC RHINITIS 01/09/2009   Glioblastoma multiforme (HCC)    stage IV   HYPERTENSION 01/09/2009   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   Left Temple   SEIZURE DISORDER 01/09/2009   Thyroid  disease    Hypothyroidism   Past Surgical History:  Procedure Laterality Date   CRANIOTOMY  2009   glioblastoma   Patient Active Problem List   Diagnosis Date Noted   Hypothyroidism 04/28/2015   GBM (glioblastoma multiforme) (HCC) 01/03/2011   Essential hypertension 01/09/2009   Allergic rhinitis 01/09/2009   SEIZURE DISORDER 01/09/2009    ONSET DATE: 2010 Stage 4 brain CA per pt report  REFERRING DIAG: C71.2 (ICD-10-CM) - Malignant neoplasm of temporal lobe  THERAPY DIAG:  Ataxic gait  Difficulty in walking, not elsewhere classified  Other symptoms and signs involving the nervous system  GBM (glioblastoma multiforme) (HCC)  Rationale for Evaluation and Treatment: Rehabilitation  SUBJECTIVE:                                                                                                                                                                                              SUBJECTIVE STATEMENT: Feeling recovered in terms of energy levels (compared to last session).  Did a lot of walking this weekend.  No recent falls.  Feeling good in general today. 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  PMH also includes: Spastic hemiplegia affecting right dominant side (CMS/HHS-HCC), sub-acute Cerebral infarct (CMS-HCC), seizures, htn, anemia, asthma, hypopotassemia.  Pt also reports having visual cut R side.  PAIN:  Are you having pain? Yes: NPRS scale: 2-3/10 currently Pain location: R UE Pain description: stiff; tight Aggravating factors: not exercising or exercising too much Relieving factors: movement (not too much or too little)  PRECAUTIONS: Fall; R field visual cut  RED FLAGS: None   WEIGHT BEARING RESTRICTIONS: No  FALLS: Has patient fallen in last 6 months? No  LIVING ENVIRONMENT: Lives with: lives with their spouse Lives in: House/apartment Stairs: No Has following equipment at home: Single point cane, Quad cane small base, Walker - 2 wheeled, Crutches, shower chair, bed side commode, and Grab bars  PLOF: Independent with gait and Independent with transfers; assist putting on the belt, coat, and tie (extra time/effort to perform on own)  PATIENT GOALS: 5 years from now I still want to be walking.  Improve walking pattern and balance.  Don't want to regress; want to get better.  OBJECTIVE:  Note: Objective measures were completed at Evaluation unless otherwise noted.  DIAGNOSTIC FINDINGS: 11/11/23 MRI Brain with and without contrast: IMPRESSION: Stable postoperative changes within the left temporal lobe. Compared to 07/29/2023, similar  enhancement along the resection margin and inferomedial left temporal lobe. No evidence of local recurrence or metastatic disease within the brain. BT-RADS 2a.   COGNITION: Overall cognitive status: Within functional limits for tasks assessed   SENSATION: Not tested (pt reports no concerns)  COORDINATION: Impaired R LE heel to shin in sitting  MUSCLE TONE: RLE: Hypertonic  POSTURE: rounded shoulders and forward head  LOWER EXTREMITY ROM:     Active  Right Eval Left Eval  Hip flexion [facility]  Hip extension    Hip abduction [facility]  Hip adduction    Hip internal rotation    Hip external rotation    Knee flexion [facility] WFL  Knee extension [facility] WFL  Ankle dorsiflexion WFL (*PROM) WFL  Ankle plantarflexion    Ankle inversion    Ankle eversion     (Blank rows = not tested)  LOWER EXTREMITY MMT:    MMT Right Eval Left Eval  Hip flexion 4+/5 5/5  Hip extension    Hip abduction    Hip adduction    Hip internal rotation    Hip external rotation    Knee flexion 4-/5 5/5  Knee extension 4-/5 5/5  Ankle dorsiflexion 2+/5 5/5  Ankle plantarflexion 2+/5 >3/5  Ankle inversion    Ankle eversion    (Blank rows = not tested)  BED MOBILITY:  Not tested  Pt reports being independent at home  TRANSFERS:  Requires L UE support Sit to stand: SBA  Assistive device utilized: None     Stand to sit: SBA  Assistive device utilized: None     Chair to chair: SBA  Assistive device utilized: None       STAIRS: Not tested.  Pt reports able to do stairs with use of railing. GAIT: Findings: Gait Characteristics: step through pattern, decreased arm swing- Right, decreased arm swing- Left, decreased step length- Right, decreased step length- Left, decreased stance time- Right, decreased stride length, decreased hip/knee flexion- Right, decreased ankle dorsiflexion- Right, circumduction- Right, Right foot flat, ataxic, decreased trunk rotation, and poor foot clearance- Right, Distance walked:  Clinic distances, Assistive device utilized:None, Level of assistance: SBA, and Comments: R LE externally rotated;  R UE flexed and at side; increased L lateral weight shift  FUNCTIONAL TESTS:  5 times sit to stand: 14.19 seconds (decreased control/quality of movement R LE 3rd-5th stand); use of L UE; on Eval Timed up and go (TUG): 12.35 seconds (did trial run first); on Eval 6 minute walk test: 671 feet (decreased R foot clearance and gait speed with increased distance ambulating) 12/01/23 Functional gait assessment: 11/30 (12/01/23)  PATIENT SURVEYS:  ABC scale: 70.31% 12/15/23 The Activities-Specific Balance Confidence (ABC) Scale 0% 10 20 30  40 50 60 70 80 90 100% No confidence<->completely confident  "How confident are you that you will not lose your balance or become unsteady when you . . .   Date tested 12/15/23  Walk around the house 75%  2. Walk up or down stairs 70% with use of railing  3. Bend over and pick up a slipper from in front of a closet floor 50%  4. Reach for a small can off a shelf at eye level 100%  5. Stand on tip toes and reach for something above your head 50%  6. Stand on a chair and reach for something 50%  7. Sweep the floor 100%  8. Walk outside the house to a car parked in the driveway 100%  9. Get into or out of a car 30% (getting into car no problem; difficulties getting out of car)  10. Walk across a parking lot to the mall 80%  11. Walk up or down a ramp 80%  12. Walk in a crowded mall where people rapidly walk past you 50%  13. Are bumped into by people as you walk through the mall 70%  14. Step onto or off of an escalator while you are holding onto the railing 100%  15. Step onto or off an escalator while holding onto parcels such that you cannot hold onto the railing 70%  16. Walk outside on icy sidewalks 50%  Total: 1125/16 70.31%                                                                                                                                  TREATMENT DATE: 12/15/23  Self Care: BP and HR taken in sitting at rest beginning of session (see below for details). Vitals:   12/15/23 1404  BP: 125/86  Pulse: 71  ABC Scale: 70.31% (see above for details)  Therapeutic Exercise: SciFit multi-peaks up to level 6 for 8 minutes using BUE/BLEs for neural priming for reciprocal movement, dynamic cardiovascular warmup, and increased amplitude of stepping. RPE of 1-2/10 following activity; HR 88 bpm post activity. Standing at counter: hip aBduction; 2x10 reps B LE's; red theraband around thighs; B UE support; SBA; vc's for technique initially; decreased quality noted R LE  Therapeutic Activity: Standing toe taps at steps: 6 inch step; B LE's; 10 reps x2 B LE's (4 trials total); improved R LE foot clearance with repetition (compensatory R  hip hiking noted); 3# ankle weights B LE's Ambulation 115 feet with 3# ankle weights B (CGA) then ambulation 115 feet no ankle weights; no UE support; improved R LE foot clearance noted after using ankle weights  PATIENT EDUCATION: 12/15/23 Education details:  Reviewed HEP and added red thera-band to upgrade hip aBduction exercise (pt issued red theraband); ABC Scale/results. Person educated: Patient Education method: Explanation and Verbal cues Education comprehension: verbalized understanding  HOME EXERCISE PROGRAM: 12/01/23 Access Code: [email or web address] URL: [email or web address] Date: 12/01/2023 Prepared by: Damien Caulk  Exercises - Standing March with Counter Support  - 1 x daily - 5-7 x weekly - 3 sets - 10 reps - Standing Hip Abduction with Unilateral Counter Support  - 1 x daily - 5 x weekly - 2 sets - 5 reps - Heel Raises with Counter Support  - 1 x daily - 5-7 x weekly - 3 sets - 10 reps  GOALS: Goals reviewed with patient? Yes  SHORT TERM GOALS: Target date: 12/26/23  Pt will be independent with initial HEP in order to improve strength and balance in order to  decrease fall risk and improve function at home for ADL's and at work.  Baseline: Goal status: INITIAL  2.  Assess (to demonstrate improvement in cardiopulmonary endurance and community ambulation).  Baseline: 671 feet 12/01/23 Goal status: MET  3.  Assess Functional Gait Assessment (to reduce fall risk and improve dynamic gait safety with community ambulation). Baseline: 11/30 (12/01/23) Goal status: MET  4.  Pt will demonstrate improved R foot clearance for safety with gait.  Baseline:  Goal status: INITIAL    LONG TERM GOALS: Target date: 01/23/24  Pt will decrease 5 Time Sit to Stand by at least 3 seconds (and quality of movement) in order to demonstrate clinically significant improvement in LE strength.  Baseline: 14.19 seconds (decreased control/quality of movement R LE 3rd-5th stand; use of L UE) 11/24/23 Goal status: INITIAL  2.  Pt will be independent with final HEP in order to improve strength and balance in order to decrease fall risk and improve function at home for ADL's and at work.  Baseline:  Goal status: INITIAL  3.  Patient will be independent in bending down towards floor and picking up small object (<5 pounds) and then stand back up without loss of balance as to improve ability to pick up and clean up room at home.  Baseline:  Goal status: INITIAL  4.  Pt will increase by at least 46m (177ft) in order to demonstrate clinically significant improvement in cardiopulmonary endurance and community ambulation.  Baseline: 671 feet 12/01/23 Goal status: INITIAL  5.  Patient will increase Functional Gait Assessment score to >20/30 as to reduce fall risk and improve dynamic gait safety with community ambulation.  Baseline: 11/30 (12/01/23) Goal status: INITIAL   ASSESSMENT:  CLINICAL IMPRESSION: Patient was seen today for physical therapy treatment to address strength, balance, and ambulation.  Focused session on ABC Scale, LE strengthening, progressing  HEP exercise with theraband, single leg balance activities, and ambulation (with and without ankle weights).  Pt's ABC scale score indicates pt is 70.31% confident that he will not lose his balance or become unsteady in noted activities.  Patient continues to be limited by balance and strength impairments.  They demonstrate improvement in activity tolerance this session and pt reports feeling like he is walking better in general; improved R LE foot clearance noted end of session during ambulation.  They would continue to benefit from  skilled PT to address impairments as noted and progress towards long term goals.  OBJECTIVE IMPAIRMENTS: Abnormal gait, decreased activity tolerance, decreased balance, decreased cognition, decreased coordination, decreased endurance, decreased knowledge of condition, decreased knowledge of use of DME, decreased mobility, difficulty walking, decreased strength, impaired tone, and impaired UE functional use.   ACTIVITY LIMITATIONS: carrying, lifting, bending, sitting, standing, squatting, stairs, transfers, continence, bathing, toileting, dressing, reach over head, locomotion level, and caring for others  PARTICIPATION LIMITATIONS: meal prep, cleaning, laundry, driving, shopping, community activity, occupation, and yard work  PERSONAL FACTORS: Age, Past/current experiences, Time since onset of injury/illness/exacerbation, and 3+ comorbidities: PMH including Left temporal glioblastoma, SDH L, spastic hemiplegia, seizures, visual cut R side are also affecting patient's functional outcome.   REHAB POTENTIAL: Good  CLINICAL DECISION MAKING: Evolving/moderate complexity  EVALUATION COMPLEXITY: Moderate  PLAN:  PT FREQUENCY: 2x/week  PT DURATION: 8 weeks  PLANNED INTERVENTIONS: [id number]- PT Re-evaluation, 97750- Physical Performance Testing, 97110-Therapeutic exercises, 97530- Therapeutic activity, [id number]- Neuromuscular re-education, 97535- Self Care, [id number]- Manual therapy,  [id number]- Gait training, [id number]- Orthotic Initial, [id number]- Orthotic/Prosthetic subsequent, [phone]- Aquatic Therapy, [phone]- Electrical stimulation (manual), [phone]- Ultrasound, Patient/Family education, Balance training, Stair training, Joint mobilization, DME instructions, Cryotherapy, and Moist heat  PLAN FOR NEXT SESSION: Review/update HEP; LE strengthening; SCI-FIT for strengthening and reciprocal movement; SLS activities; balance; increase R LE step length and foot clearance; leg press?; tall kneeling?   Damien Caulk, PT 12/15/2023, 3:28 PM

## 2023-12-18 ENCOUNTER — Ambulatory Visit: Admitting: Physical Therapy

## 2023-12-18 ENCOUNTER — Encounter: Payer: Self-pay | Admitting: Physical Therapy

## 2023-12-18 VITALS — BP 133/88 | HR 82

## 2023-12-18 DIAGNOSIS — R262 Difficulty in walking, not elsewhere classified: Secondary | ICD-10-CM

## 2023-12-18 DIAGNOSIS — R260 Ataxic gait: Secondary | ICD-10-CM

## 2023-12-18 DIAGNOSIS — R29818 Other symptoms and signs involving the nervous system: Secondary | ICD-10-CM

## 2023-12-18 DIAGNOSIS — C719 Malignant neoplasm of brain, unspecified: Secondary | ICD-10-CM

## 2023-12-18 NOTE — Therapy (Signed)
 OUTPATIENT PHYSICAL THERAPY NEURO TREATMENT   Patient Name: [name] MRN: [id number] DOB:31-Jul-1960, 63 y.o., male Today's Date: 12/18/2023   PCP: Dwight Trula SQUIBB, MD REFERRING PROVIDER: Camalier, Curry Scarce, NP  END OF SESSION:  PT End of Session - 12/18/23 1406     Visit Number 7    Number of Visits 17   16 plus Eval   Date for Recertification  01/23/24    Authorization Type [employer]    Authorization Time Period 12/01/23 - 01/26/24    Authorization - Visit Number 7    Authorization - Number of Visits 17    Progress Note Due on Visit 10    PT Start Time 1405   Therapist running late   PT Stop Time 1445    PT Time Calculation (min) 40 min    Equipment Utilized During Treatment Gait belt    Activity Tolerance Patient tolerated treatment well    Behavior During Therapy WFL for tasks assessed/performed           Past Medical History:  Diagnosis Date   ALLERGIC RHINITIS 01/09/2009   Glioblastoma multiforme (HCC)    stage IV   HYPERTENSION 01/09/2009   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   Left Temple   SEIZURE DISORDER 01/09/2009   Thyroid  disease    Hypothyroidism   Past Surgical History:  Procedure Laterality Date   CRANIOTOMY  2009   glioblastoma   Patient Active Problem List   Diagnosis Date Noted   Hypothyroidism 04/28/2015   GBM (glioblastoma multiforme) (HCC) 01/03/2011   Essential hypertension 01/09/2009   Allergic rhinitis 01/09/2009   SEIZURE DISORDER 01/09/2009    ONSET DATE: 2010 Stage 4 brain CA per pt report  REFERRING DIAG: C71.2 (ICD-10-CM) - Malignant neoplasm of temporal lobe  THERAPY DIAG:  Ataxic gait  Other symptoms and signs involving the nervous system  Difficulty in walking, not elsewhere classified  GBM (glioblastoma multiforme) (HCC)  Rationale for Evaluation and Treatment: Rehabilitation  SUBJECTIVE:                                                                                                                                                                                              SUBJECTIVE STATEMENT: Pt reports ready to walk in the sun (d/t being rainy a lot this week).  No recent falls.  Feels R UE is not as tight (feels like it is looser d/t using Sci-Fit). 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  PMH also includes: Spastic hemiplegia affecting right dominant side (CMS/HHS-HCC), sub-acute Cerebral infarct (CMS-HCC), seizures, htn, anemia, asthma, hypopotassemia.  Pt also reports having visual cut R side.  PAIN:  Are you having pain? Yes: NPRS scale: 3/10 currently Pain location: R UE Pain description: stiff; tight Aggravating factors: not exercising or exercising too much Relieving factors: movement (not too much or too little)  PRECAUTIONS: Fall; R field visual cut  RED FLAGS: None   WEIGHT BEARING RESTRICTIONS: No  FALLS: Has patient fallen in last 6 months? No  LIVING ENVIRONMENT: Lives with: lives with their spouse Lives in: House/apartment Stairs: No Has following equipment at home: Single point cane, Quad cane small base, Walker - 2 wheeled, Crutches, shower chair, bed side commode, and Grab bars  PLOF: Independent with gait and Independent with transfers; assist putting on the belt, coat, and tie (extra time/effort to perform on own)  PATIENT GOALS: 5 years from now I still want to be walking.  Improve walking pattern and balance.  Don't want to regress; want to get better.  OBJECTIVE:  Note: Objective measures were completed at Evaluation unless otherwise noted.  DIAGNOSTIC FINDINGS: 11/11/23 MRI Brain with and without contrast: IMPRESSION: Stable postoperative changes within the left temporal lobe.  Compared to 07/29/2023, similar enhancement along the resection margin and inferomedial left temporal lobe. No evidence of local recurrence or metastatic disease within the brain. BT-RADS 2a.   COGNITION: Overall cognitive status: Within functional limits for tasks assessed   SENSATION: Not tested (pt reports no concerns)  COORDINATION: Impaired R LE heel to shin in sitting  MUSCLE TONE: RLE: Hypertonic  POSTURE: rounded shoulders and forward head  LOWER EXTREMITY ROM:     Active  Right Eval Left Eval  Hip flexion [facility]  Hip extension    Hip abduction [facility]  Hip adduction    Hip internal rotation    Hip external rotation    Knee flexion [facility] WFL  Knee extension [facility] WFL  Ankle dorsiflexion WFL (*PROM) WFL  Ankle plantarflexion    Ankle inversion    Ankle eversion     (Blank rows = not tested)  LOWER EXTREMITY MMT:    MMT Right Eval Left Eval  Hip flexion 4+/5 5/5  Hip extension    Hip abduction    Hip adduction    Hip internal rotation    Hip external rotation    Knee flexion 4-/5 5/5  Knee extension 4-/5 5/5  Ankle dorsiflexion 2+/5 5/5  Ankle plantarflexion 2+/5 >3/5  Ankle inversion    Ankle eversion    (Blank rows = not tested)  BED MOBILITY:  Not tested  Pt reports being independent at home  TRANSFERS:  Requires L UE support Sit to stand: SBA  Assistive device utilized: None     Stand to sit: SBA  Assistive device utilized: None     Chair to chair: SBA  Assistive device utilized: None       STAIRS: Not tested.  Pt reports able to do stairs with use of railing. GAIT: Findings: Gait Characteristics: step through pattern, decreased arm swing- Right, decreased arm swing- Left, decreased step length- Right, decreased step length- Left, decreased stance time- Right, decreased stride length, decreased hip/knee flexion- Right, decreased ankle dorsiflexion- Right, circumduction- Right, Right foot flat, ataxic, decreased trunk rotation, and poor foot  clearance- Right, Distance walked: Clinic distances, Assistive device utilized:None,  Level of assistance: SBA, and Comments: R LE externally rotated; R UE flexed and at side; increased L lateral weight shift  FUNCTIONAL TESTS:  5 times sit to stand: 14.19 seconds (decreased control/quality of movement R LE 3rd-5th stand); use of L UE; on Eval Timed up and go (TUG): 12.35 seconds (did trial run first); on Eval 6 minute walk test: 671 feet (decreased R foot clearance and gait speed with increased distance ambulating) 12/01/23 Functional gait assessment: 11/30 (12/01/23)  PATIENT SURVEYS:  ABC scale: 70.31% 12/15/23 The Activities-Specific Balance Confidence (ABC) Scale 0% 10 20 30  40 50 60 70 80 90 100% No confidence<->completely confident  "How confident are you that you will not lose your balance or become unsteady when you . . .   Date tested 12/15/23  Walk around the house 75%  2. Walk up or down stairs 70% with use of railing  3. Bend over and pick up a slipper from in front of a closet floor 50%  4. Reach for a small can off a shelf at eye level 100%  5. Stand on tip toes and reach for something above your head 50%  6. Stand on a chair and reach for something 50%  7. Sweep the floor 100%  8. Walk outside the house to a car parked in the driveway 100%  9. Get into or out of a car 30% (getting into car no problem; difficulties getting out of car)  10. Walk across a parking lot to the mall 80%  11. Walk up or down a ramp 80%  12. Walk in a crowded mall where people rapidly walk past you 50%  13. Are bumped into by people as you walk through the mall 70%  14. Step onto or off of an escalator while you are holding onto the railing 100%  15. Step onto or off an escalator while holding onto parcels such that you cannot hold onto the railing 70%  16. Walk outside on icy sidewalks 50%  Total: 1125/16 70.31%                                                                                                                                  TREATMENT DATE: 12/18/23  Self Care BP and HR taken in sitting at rest beginning of session (see below for details). Vitals:   12/18/23 1411  BP: 133/88  Pulse: 82   Therapeutic Exercise: SciFit multi-peaks up to level 7 for 8 minutes using BUE/BLEs for neural priming for reciprocal movement, dynamic cardiovascular warmup and increased amplitude of stepping. RPE of 2/10 following activity; 104 bpm HR post activity  Therapeutic Activity: Sit to stands from mat table: x10 reps no UE support; 10 reps x2 sets with 5# kettle bell (holding on with B hands midline); vc's for increased WB'ing through R LE and B glute activation Ambulation with 3# ankle weights B LE's; x230 feet; CGA; no AD use; to  improve B LE foot clearance Toe taps at stairs: 6 inch step; no UE support; B LE toe taps with 3# ankle weights; decreased foot clearance/increased difficulty noted with R LE; decreased R LE stability (compared to L LE) with L toe taps; CGA; 10 reps x2 sets B LE's   PATIENT EDUCATION: 12/15/23 Education details:  Use SPC in community for safety (for uneven surfaces). Person educated: Patient Education method: Explanation and Verbal cues Education comprehension: verbalized understanding  HOME EXERCISE PROGRAM: 12/01/23 Access Code: [email or web address] URL: [email or web address] Date: 12/01/2023 Prepared by: Damien Caulk  Exercises - Standing March with Counter Support  - 1 x daily - 5-7 x weekly - 3 sets - 10 reps - Standing Hip Abduction with Unilateral Counter Support  - 1 x daily - 5 x weekly - 2 sets - 5 reps - Heel Raises with Counter Support  - 1 x daily - 5-7 x weekly - 3 sets - 10 reps  GOALS: Goals reviewed with patient? Yes  SHORT TERM GOALS: Target date: 12/26/23  Pt will be independent with initial HEP in order to improve strength and balance in order to decrease fall risk and improve function at home for ADL's and at work.   Baseline: Goal status: INITIAL  2.  Assess (to demonstrate improvement in cardiopulmonary endurance and community ambulation).  Baseline: 671 feet 12/01/23 Goal status: MET  3.  Assess Functional Gait Assessment (to reduce fall risk and improve dynamic gait safety with community ambulation). Baseline: 11/30 (12/01/23) Goal status: MET  4.  Pt will demonstrate improved R foot clearance for safety with gait.  Baseline:  Goal status: INITIAL    LONG TERM GOALS: Target date: 01/23/24  Pt will decrease 5 Time Sit to Stand by at least 3 seconds (and quality of movement) in order to demonstrate clinically significant improvement in LE strength.  Baseline: 14.19 seconds (decreased control/quality of movement R LE 3rd-5th stand; use of L UE) 11/24/23 Goal status: INITIAL  2.  Pt will be independent with final HEP in order to improve strength and balance in order to decrease fall risk and improve function at home for ADL's and at work.  Baseline:  Goal status: INITIAL  3.  Patient will be independent in bending down towards floor and picking up small object (<5 pounds) and then stand back up without loss of balance as to improve ability to pick up and clean up room at home.  Baseline:  Goal status: INITIAL  4.  Pt will increase by at least 6m (160ft) in order to demonstrate clinically significant improvement in cardiopulmonary endurance and community ambulation.  Baseline: 671 feet 12/01/23 Goal status: INITIAL  5.  Patient will increase Functional Gait Assessment score to >20/30 as to reduce fall risk and improve dynamic gait safety with community ambulation.  Baseline: 11/30 (12/01/23) Goal status: INITIAL   ASSESSMENT:  CLINICAL IMPRESSION: Patient was seen today for physical therapy treatment to address LE strength, balance, transfers, and ambulation.  Focused session on LE strengthening, improving LE foot clearance and step height/length for ambulation (using ankle  weights during activities), functional transfers, and single leg balance.  Patient continues to be limited by balance and strength impairments.  They demonstrate improvement in R LE muscle activation and R LE WB'ing with cueing during sit to stand activity.  They would continue to benefit from skilled PT to address impairments as noted and progress towards long term goals.  OBJECTIVE IMPAIRMENTS: Abnormal gait, decreased activity tolerance, decreased balance, decreased  cognition, decreased coordination, decreased endurance, decreased knowledge of condition, decreased knowledge of use of DME, decreased mobility, difficulty walking, decreased strength, impaired tone, and impaired UE functional use.   ACTIVITY LIMITATIONS: carrying, lifting, bending, sitting, standing, squatting, stairs, transfers, continence, bathing, toileting, dressing, reach over head, locomotion level, and caring for others  PARTICIPATION LIMITATIONS: meal prep, cleaning, laundry, driving, shopping, community activity, occupation, and yard work  PERSONAL FACTORS: Age, Past/current experiences, Time since onset of injury/illness/exacerbation, and 3+ comorbidities: PMH including Left temporal glioblastoma, SDH L, spastic hemiplegia, seizures, visual cut R side are also affecting patient's functional outcome.   REHAB POTENTIAL: Good  CLINICAL DECISION MAKING: Evolving/moderate complexity  EVALUATION COMPLEXITY: Moderate  PLAN:  PT FREQUENCY: 2x/week  PT DURATION: 8 weeks  PLANNED INTERVENTIONS: [id number]- PT Re-evaluation, 97750- Physical Performance Testing, 97110-Therapeutic exercises, 97530- Therapeutic activity, [id number]- Neuromuscular re-education, 97535- Self Care, [id number]- Manual therapy, [id number]- Gait training, [id number]- Orthotic Initial, [id number]- Orthotic/Prosthetic subsequent, [phone]- Aquatic Therapy, [phone]- Electrical stimulation (manual), [phone]- Ultrasound, Patient/Family education, Balance training, Stair training, Joint  mobilization, DME instructions, Cryotherapy, and Moist heat  PLAN FOR NEXT SESSION: Review/update HEP; LE strengthening; SCI-FIT for strengthening and reciprocal movement; SLS activities; balance; increase R LE step length and foot clearance; leg press?; tall kneeling?   Damien Caulk, PT 12/18/2023, 8:09 PM

## 2023-12-22 ENCOUNTER — Encounter: Payer: Self-pay | Admitting: Physical Therapy

## 2023-12-22 ENCOUNTER — Ambulatory Visit: Admitting: Physical Therapy

## 2023-12-22 VITALS — BP 122/82 | HR 81

## 2023-12-22 DIAGNOSIS — R262 Difficulty in walking, not elsewhere classified: Secondary | ICD-10-CM | POA: Diagnosis present

## 2023-12-22 DIAGNOSIS — C719 Malignant neoplasm of brain, unspecified: Secondary | ICD-10-CM | POA: Insufficient documentation

## 2023-12-22 DIAGNOSIS — R29818 Other symptoms and signs involving the nervous system: Secondary | ICD-10-CM | POA: Diagnosis present

## 2023-12-22 DIAGNOSIS — R260 Ataxic gait: Secondary | ICD-10-CM | POA: Diagnosis present

## 2023-12-22 NOTE — Therapy (Signed)
 OUTPATIENT PHYSICAL THERAPY NEURO TREATMENT   Patient Name: [name] MRN: [id number] DOB:09-22-1960, 63 y.o., male Today's Date: 12/22/2023   PCP: Dwight Trula SQUIBB, MD REFERRING PROVIDER: Camalier, Curry Scarce, NP  END OF SESSION:  PT End of Session - 12/22/23 1407     Visit Number 8    Number of Visits 17   16 plus Eval   Date for Recertification  01/23/24    Authorization Type [employer]    Authorization Time Period 12/01/23 - 01/26/24    Authorization - Visit Number 8    Authorization - Number of Visits 17    Progress Note Due on Visit 10    PT Start Time 1405    PT Stop Time 1453    PT Time Calculation (min) 48 min    Equipment Utilized During Treatment Gait belt    Activity Tolerance Patient tolerated treatment well    Behavior During Therapy WFL for tasks assessed/performed           Past Medical History:  Diagnosis Date   ALLERGIC RHINITIS 01/09/2009   Glioblastoma multiforme (HCC)    stage IV   HYPERTENSION 01/09/2009   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   Left Temple   SEIZURE DISORDER 01/09/2009   Thyroid  disease    Hypothyroidism   Past Surgical History:  Procedure Laterality Date   CRANIOTOMY  2009   glioblastoma   Patient Active Problem List   Diagnosis Date Noted   Hypothyroidism 04/28/2015   GBM (glioblastoma multiforme) (HCC) 01/03/2011   Essential hypertension 01/09/2009   Allergic rhinitis 01/09/2009   SEIZURE DISORDER 01/09/2009    ONSET DATE: 2010 Stage 4 brain CA per pt report  REFERRING DIAG: C71.2 (ICD-10-CM) - Malignant neoplasm of temporal lobe  THERAPY DIAG:  Ataxic gait  Other symptoms and signs involving the nervous system  Difficulty in walking, not elsewhere classified  GBM (glioblastoma multiforme) (HCC)  Rationale for Evaluation and Treatment: Rehabilitation  SUBJECTIVE:                                                                                                                                                                                              SUBJECTIVE STATEMENT: No recent falls.  Pt reports his wife and friend have stated that he is walking better (clearing R foot better). 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  PMH also  includes: Spastic hemiplegia affecting right dominant side (CMS/HHS-HCC), sub-acute Cerebral infarct (CMS-HCC), seizures, htn, anemia, asthma, hypopotassemia.  Pt also reports having visual cut R side.  PAIN:  Are you having pain? Yes: NPRS scale: 2/10 currently Pain location: R UE Pain description: stiff; tight Aggravating factors: not exercising or exercising too much Relieving factors: movement (not too much or too little)  PRECAUTIONS: Fall; R field visual cut  RED FLAGS: None   WEIGHT BEARING RESTRICTIONS: No  FALLS: Has patient fallen in last 6 months? No  LIVING ENVIRONMENT: Lives with: lives with their spouse Lives in: House/apartment Stairs: No Has following equipment at home: Single point cane, Quad cane small base, Walker - 2 wheeled, Crutches, shower chair, bed side commode, and Grab bars  PLOF: Independent with gait and Independent with transfers; assist putting on the belt, coat, and tie (extra time/effort to perform on own)  PATIENT GOALS: 5 years from now I still want to be walking.  Improve walking pattern and balance.  Don't want to regress; want to get better.  OBJECTIVE:  Note: Objective measures were completed at Evaluation unless otherwise noted.  DIAGNOSTIC FINDINGS: 11/11/23 MRI Brain with and without contrast: IMPRESSION: Stable postoperative changes within the left temporal lobe. Compared to 07/29/2023, similar enhancement along the resection margin and  inferomedial left temporal lobe. No evidence of local recurrence or metastatic disease within the brain. BT-RADS 2a.   COGNITION: Overall cognitive status: Within functional limits for tasks assessed   SENSATION: Not tested (pt reports no concerns)  COORDINATION: Impaired R LE heel to shin in sitting  MUSCLE TONE: RLE: Hypertonic  POSTURE: rounded shoulders and forward head  LOWER EXTREMITY ROM:     Active  Right Eval Left Eval  Hip flexion [facility]  Hip extension    Hip abduction [facility]  Hip adduction    Hip internal rotation    Hip external rotation    Knee flexion [facility] WFL  Knee extension [facility] WFL  Ankle dorsiflexion WFL (*PROM) WFL  Ankle plantarflexion    Ankle inversion    Ankle eversion     (Blank rows = not tested)  LOWER EXTREMITY MMT:    MMT Right Eval Left Eval  Hip flexion 4+/5 5/5  Hip extension    Hip abduction    Hip adduction    Hip internal rotation    Hip external rotation    Knee flexion 4-/5 5/5  Knee extension 4-/5 5/5  Ankle dorsiflexion 2+/5 5/5  Ankle plantarflexion 2+/5 >3/5  Ankle inversion    Ankle eversion    (Blank rows = not tested)  BED MOBILITY:  Not tested  Pt reports being independent at home  TRANSFERS:  Requires L UE support Sit to stand: SBA  Assistive device utilized: None     Stand to sit: SBA  Assistive device utilized: None     Chair to chair: SBA  Assistive device utilized: None       STAIRS: Not tested.  Pt reports able to do stairs with use of railing. GAIT: Findings: Gait Characteristics: step through pattern, decreased arm swing- Right, decreased arm swing- Left, decreased step length- Right, decreased step length- Left, decreased stance time- Right, decreased stride length, decreased hip/knee flexion- Right, decreased ankle dorsiflexion- Right, circumduction- Right, Right foot flat, ataxic, decreased trunk rotation, and poor foot clearance- Right, Distance walked: Clinic distances, Assistive device  utilized:None, Level of assistance: SBA, and Comments: R LE externally rotated; R UE flexed and at side; increased L  lateral weight shift  FUNCTIONAL TESTS:  5 times sit to stand: 14.19 seconds (decreased control/quality of movement R LE 3rd-5th stand); use of L UE; on Eval Timed up and go (TUG): 12.35 seconds (did trial run first); on Eval 6 minute walk test: 671 feet (decreased R foot clearance and gait speed with increased distance ambulating) 12/01/23 Functional gait assessment: 11/30 (12/01/23)  PATIENT SURVEYS:  ABC scale: 70.31% 12/15/23 The Activities-Specific Balance Confidence (ABC) Scale 0% 10 20 30  40 50 60 70 80 90 100% No confidence<->completely confident  "How confident are you that you will not lose your balance or become unsteady when you . . .   Date tested 12/15/23  Walk around the house 75%  2. Walk up or down stairs 70% with use of railing  3. Bend over and pick up a slipper from in front of a closet floor 50%  4. Reach for a small can off a shelf at eye level 100%  5. Stand on tip toes and reach for something above your head 50%  6. Stand on a chair and reach for something 50%  7. Sweep the floor 100%  8. Walk outside the house to a car parked in the driveway 100%  9. Get into or out of a car 30% (getting into car no problem; difficulties getting out of car)  10. Walk across a parking lot to the mall 80%  11. Walk up or down a ramp 80%  12. Walk in a crowded mall where people rapidly walk past you 50%  13. Are bumped into by people as you walk through the mall 70%  14. Step onto or off of an escalator while you are holding onto the railing 100%  15. Step onto or off an escalator while holding onto parcels such that you cannot hold onto the railing 70%  16. Walk outside on icy sidewalks 50%  Total: 1125/16 70.31%                                                                                                                                 TREATMENT DATE:  12/22/2023  Self Care: BP and HR taken in sitting at rest beginning of session (see below for details). Vitals:   12/22/23 1411  BP: 122/82  Pulse: 81   Therapeutic Exercise: Supine R LE hip flexion: 10 reps x2 sets (R LE starting off side of mat table and then brings R LE onto mat table with min assist) Supine bridging (pillow between knees): 10 reps x3 sets; used pillow between knees to stabilize knees and internally rotate R LE which improved R foot flat positioning and R LE use Mini squat holds with back against wall: 3x30 seconds; vc's to increase R LE WB'ing and R LE activation  Therapeutic Activity: Steps ups at stairs: 6 inch step; step up with LE and then bringing opposite LE into hip flexion (up to approximately 80 degrees hip  flexion at most); then stepping back to starting position; 2# weights B ankles; x10 reps B LE's; L UE support; 8/10 RPE post activity; circumduction noted to clear steps with R LE; decreased R hip flexion AROM in general Step ups (6 inch step at ballet bars); x10 reps B LE's; step up with LE and then bringing opposite LE into hip flexion (up to approximately 80 degrees hip flexion at most); then stepping back to starting position; 2# ankle weights; L UE support; decreased R LE circumduction noted; decreased R hip flexion AROM noted in general   PATIENT EDUCATION:  Education details:  HEP update. Person educated: Patient Education method: Explanation and Verbal cues Education comprehension: verbalized understanding  HOME EXERCISE PROGRAM: Access Code: [email or web address] URL: [email or web address] Date: 12/22/2023 Prepared by: Damien Caulk  Exercises - Standing March with Counter Support  - 1 x daily - 5-7 x weekly - 3 sets - 10 reps - Standing Hip Abduction with Unilateral Counter Support  - 1 x daily - 5 x weekly - 2 sets - 5 reps - Heel Raises with Counter Support  - 1 x daily - 5-7 x weekly - 3 sets - 10 reps - Supine Bridge with Mini Swiss  Ball Between Knees  - 1 x daily - 5 x weekly - 2-3 sets - 10 reps  GOALS: Goals reviewed with patient? Yes  SHORT TERM GOALS: Target date: 12/26/23  Pt will be independent with initial HEP in order to improve strength and balance in order to decrease fall risk and improve function at home for ADL's and at work.  Baseline: Goal status: INITIAL  2.  Assess (to demonstrate improvement in cardiopulmonary endurance and community ambulation).  Baseline: 671 feet 12/01/23 Goal status: MET  3.  Assess Functional Gait Assessment (to reduce fall risk and improve dynamic gait safety with community ambulation). Baseline: 11/30 (12/01/23) Goal status: MET  4.  Pt will demonstrate improved R foot clearance for safety with gait.  Baseline:  Goal status: INITIAL    LONG TERM GOALS: Target date: 01/23/24  Pt will decrease 5 Time Sit to Stand by at least 3 seconds (and quality of movement) in order to demonstrate clinically significant improvement in LE strength.  Baseline: 14.19 seconds (decreased control/quality of movement R LE 3rd-5th stand; use of L UE) 11/24/23 Goal status: INITIAL  2.  Pt will be independent with final HEP in order to improve strength and balance in order to decrease fall risk and improve function at home for ADL's and at work.  Baseline:  Goal status: INITIAL  3.  Patient will be independent in bending down towards floor and picking up small object (<5 pounds) and then stand back up without loss of balance as to improve ability to pick up and clean up room at home.  Baseline:  Goal status: INITIAL  4.  Pt will increase by at least 51m (132ft) in order to demonstrate clinically significant improvement in cardiopulmonary endurance and community ambulation.  Baseline: 671 feet 12/01/23 Goal status: INITIAL  5.  Patient will increase Functional Gait Assessment score to >20/30 as to reduce fall risk and improve dynamic gait safety with community ambulation.   Baseline: 11/30 (12/01/23) Goal status: INITIAL   ASSESSMENT:  CLINICAL IMPRESSION: Patient was seen today for physical therapy treatment to address strengthening and gait mechanics.  Focused session on updating HEP, hip and core stabilization, LE strengthening, and improving R LE foot clearance and step length.  Performed R hip flexion activities (  with R knee flexed) to improve R LE foot clearance during ambulation.  Also worked on increasing R LE WB'ing and R LE activation with activities.  Patient continues to be limited by balance and strength impairments.  They demonstrate improvement in R foot clearance and report his wife and friend have commented on his improved gait and foot clearance.  They would continue to benefit from skilled PT to address impairments as noted and progress towards long term goals.  OBJECTIVE IMPAIRMENTS: Abnormal gait, decreased activity tolerance, decreased balance, decreased cognition, decreased coordination, decreased endurance, decreased knowledge of condition, decreased knowledge of use of DME, decreased mobility, difficulty walking, decreased strength, impaired tone, and impaired UE functional use.   ACTIVITY LIMITATIONS: carrying, lifting, bending, sitting, standing, squatting, stairs, transfers, continence, bathing, toileting, dressing, reach over head, locomotion level, and caring for others  PARTICIPATION LIMITATIONS: meal prep, cleaning, laundry, driving, shopping, community activity, occupation, and yard work  PERSONAL FACTORS: Age, Past/current experiences, Time since onset of injury/illness/exacerbation, and 3+ comorbidities: PMH including Left temporal glioblastoma, SDH L, spastic hemiplegia, seizures, visual cut R side are also affecting patient's functional outcome.   REHAB POTENTIAL: Good  CLINICAL DECISION MAKING: Evolving/moderate complexity  EVALUATION COMPLEXITY: Moderate  PLAN:  PT FREQUENCY: 2x/week  PT DURATION: 8 weeks  PLANNED  INTERVENTIONS: [id number]- PT Re-evaluation, 97750- Physical Performance Testing, 97110-Therapeutic exercises, 97530- Therapeutic activity, [id number]- Neuromuscular re-education, 97535- Self Care, [id number]- Manual therapy, [id number]- Gait training, [id number]- Orthotic Initial, [id number]- Orthotic/Prosthetic subsequent, [phone]- Aquatic Therapy, [phone]- Electrical stimulation (manual), [phone]- Ultrasound, Patient/Family education, Balance training, Stair training, Joint mobilization, DME instructions, Cryotherapy, and Moist heat  PLAN FOR NEXT SESSION: 4 week goals/STG's due; Review/update HEP; LE strengthening; SCI-FIT for strengthening and reciprocal movement; SLS activities; balance; increase R LE step length and foot clearance; leg press?; tall kneeling?   Damien Caulk, PT 12/22/2023, 3:26 PM

## 2023-12-25 ENCOUNTER — Encounter: Payer: Self-pay | Admitting: Physical Therapy

## 2023-12-25 ENCOUNTER — Ambulatory Visit: Admitting: Physical Therapy

## 2023-12-25 VITALS — BP 129/78 | HR 92

## 2023-12-25 DIAGNOSIS — R262 Difficulty in walking, not elsewhere classified: Secondary | ICD-10-CM

## 2023-12-25 DIAGNOSIS — R260 Ataxic gait: Secondary | ICD-10-CM | POA: Diagnosis not present

## 2023-12-25 DIAGNOSIS — R29818 Other symptoms and signs involving the nervous system: Secondary | ICD-10-CM

## 2023-12-25 DIAGNOSIS — C719 Malignant neoplasm of brain, unspecified: Secondary | ICD-10-CM

## 2023-12-25 NOTE — Therapy (Signed)
 OUTPATIENT PHYSICAL THERAPY NEURO TREATMENT   Patient Name: [name] MRN: [id number] DOB:October 04, 1960, 63 y.o., male Today's Date: 12/25/2023   PCP: Dwight Trula SQUIBB, MD REFERRING PROVIDER: Camalier, Curry Scarce, NP  END OF SESSION:  PT End of Session - 12/25/23 1428     Visit Number 9    Number of Visits 17   16 plus Eval   Date for Recertification  01/23/24    Authorization Type [employer]    Authorization Time Period 12/01/23 - 01/26/24    Authorization - Visit Number 9    Authorization - Number of Visits 17    Progress Note Due on Visit 10    PT Start Time 1407   Therapist running late   PT Stop Time 1452    PT Time Calculation (min) 45 min    Equipment Utilized During Treatment Gait belt    Activity Tolerance Patient tolerated treatment well    Behavior During Therapy WFL for tasks assessed/performed            Past Medical History:  Diagnosis Date   ALLERGIC RHINITIS 01/09/2009   Glioblastoma multiforme (HCC)    stage IV   HYPERTENSION 01/09/2009   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   Left Temple   SEIZURE DISORDER 01/09/2009   Thyroid  disease    Hypothyroidism   Past Surgical History:  Procedure Laterality Date   CRANIOTOMY  2009   glioblastoma   Patient Active Problem List   Diagnosis Date Noted   Hypothyroidism 04/28/2015   GBM (glioblastoma multiforme) (HCC) 01/03/2011   Essential hypertension 01/09/2009   Allergic rhinitis 01/09/2009   SEIZURE DISORDER 01/09/2009    ONSET DATE: 2010 Stage 4 brain CA per pt report  REFERRING DIAG: C71.2 (ICD-10-CM) - Malignant neoplasm of temporal lobe  THERAPY DIAG:  Other symptoms and signs involving the nervous system  Difficulty in walking, not elsewhere classified  GBM (glioblastoma multiforme) (HCC)  Rationale for Evaluation and Treatment: Rehabilitation  SUBJECTIVE:                                                                                                                                                                                              SUBJECTIVE STATEMENT: Pt reports no recent falls.  Feels sore/stiff in LE's after doing therapy on Tuesday and HEP yesterday. 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  PMH also includes:  Spastic hemiplegia affecting right dominant side (CMS/HHS-HCC), sub-acute Cerebral infarct (CMS-HCC), seizures, htn, anemia, asthma, hypopotassemia.  Pt also reports having visual cut R side.  PAIN:  Are you having pain? Yes: NPRS scale: 3-4/10 currently Pain location: R UE and B LE's Pain description: stiff; tight Aggravating factors: not exercising or exercising too much Relieving factors: movement (not too much or too little)  PRECAUTIONS: Fall; R field visual cut  RED FLAGS: None   WEIGHT BEARING RESTRICTIONS: No  FALLS: Has patient fallen in last 6 months? No  LIVING ENVIRONMENT: Lives with: lives with their spouse Lives in: House/apartment Stairs: No Has following equipment at home: Single point cane, Quad cane small base, Walker - 2 wheeled, Crutches, shower chair, bed side commode, and Grab bars  PLOF: Independent with gait and Independent with transfers; assist putting on the belt, coat, and tie (extra time/effort to perform on own)  PATIENT GOALS: 5 years from now I still want to be walking.  Improve walking pattern and balance.  Don't want to regress; want to get better.  OBJECTIVE:  Note: Objective measures were completed at Evaluation unless otherwise noted.  DIAGNOSTIC FINDINGS: 11/11/23 MRI Brain with and without contrast: IMPRESSION: Stable postoperative changes within the left temporal lobe. Compared to 07/29/2023, similar enhancement along the resection  margin and inferomedial left temporal lobe. No evidence of local recurrence or metastatic disease within the brain. BT-RADS 2a.   COGNITION: Overall cognitive status: Within functional limits for tasks assessed   SENSATION: Not tested (pt reports no concerns)  COORDINATION: Impaired R LE heel to shin in sitting  MUSCLE TONE: RLE: Hypertonic  POSTURE: rounded shoulders and forward head  LOWER EXTREMITY ROM:     Active  Right Eval Left Eval  Hip flexion [facility]  Hip extension    Hip abduction [facility]  Hip adduction    Hip internal rotation    Hip external rotation    Knee flexion [facility] WFL  Knee extension [facility] WFL  Ankle dorsiflexion WFL (*PROM) WFL  Ankle plantarflexion    Ankle inversion    Ankle eversion     (Blank rows = not tested)  LOWER EXTREMITY MMT:    MMT Right Eval Left Eval  Hip flexion 4+/5 5/5  Hip extension    Hip abduction    Hip adduction    Hip internal rotation    Hip external rotation    Knee flexion 4-/5 5/5  Knee extension 4-/5 5/5  Ankle dorsiflexion 2+/5 5/5  Ankle plantarflexion 2+/5 >3/5  Ankle inversion    Ankle eversion    (Blank rows = not tested)  BED MOBILITY:  Not tested  Pt reports being independent at home  TRANSFERS:  Requires L UE support Sit to stand: SBA  Assistive device utilized: None     Stand to sit: SBA  Assistive device utilized: None     Chair to chair: SBA  Assistive device utilized: None       STAIRS: Not tested.  Pt reports able to do stairs with use of railing. GAIT: Findings: Gait Characteristics: step through pattern, decreased arm swing- Right, decreased arm swing- Left, decreased step length- Right, decreased step length- Left, decreased stance time- Right, decreased stride length, decreased hip/knee flexion- Right, decreased ankle dorsiflexion- Right, circumduction- Right, Right foot flat, ataxic, decreased trunk rotation, and poor foot clearance- Right, Distance walked: Clinic distances, Assistive  device utilized:None, Level of assistance: SBA, and Comments: R LE externally rotated; R UE flexed and at side;  increased L lateral weight shift  FUNCTIONAL TESTS:  5 times sit to stand: 14.19 seconds (decreased control/quality of movement R LE 3rd-5th stand); use of L UE; on Eval Timed up and go (TUG): 12.35 seconds (did trial run first); on Eval 6 minute walk test: 671 feet (decreased R foot clearance and gait speed with increased distance ambulating) 12/01/23 Functional gait assessment: 11/30 (12/01/23)  PATIENT SURVEYS:  ABC scale: 70.31% 12/15/23 The Activities-Specific Balance Confidence (ABC) Scale 0% 10 20 30  40 50 60 70 80 90 100% No confidence<->completely confident  "How confident are you that you will not lose your balance or become unsteady when you . . .   Date tested 12/15/23  Walk around the house 75%  2. Walk up or down stairs 70% with use of railing  3. Bend over and pick up a slipper from in front of a closet floor 50%  4. Reach for a small can off a shelf at eye level 100%  5. Stand on tip toes and reach for something above your head 50%  6. Stand on a chair and reach for something 50%  7. Sweep the floor 100%  8. Walk outside the house to a car parked in the driveway 100%  9. Get into or out of a car 30% (getting into car no problem; difficulties getting out of car)  10. Walk across a parking lot to the mall 80%  11. Walk up or down a ramp 80%  12. Walk in a crowded mall where people rapidly walk past you 50%  13. Are bumped into by people as you walk through the mall 70%  14. Step onto or off of an escalator while you are holding onto the railing 100%  15. Step onto or off an escalator while holding onto parcels such that you cannot hold onto the railing 70%  16. Walk outside on icy sidewalks 50%  Total: 1125/16 70.31%                                                                                                                                 TREATMENT  DATE: 12/25/23  Self Care: BP and HR taken in sitting after Sci-Fit towards beginning of session (see below for details). Vitals:   12/25/23 1429  BP: 129/78  Pulse: 92    Therapeutic Exercise: SciFit multi-peaks up to level 7 for 8 minutes using BUE/BLEs for neural priming for reciprocal movement, dynamic cardiovascular warmup and increased amplitude of stepping. RPE of 2/10 following activity.  Pt reports this exercise loosened up his LE's and R UE (felt better).  Therapeutic Activity: Lateral step ups on 6 inch step: x10 reps x3 sets L LE (L UE support 1st trial CGA; no UE support 2nd trial with CGA to min assist; no UE support 3rd trial with CGA); x10 reps x3 sets with R LE (L UE support 1st and 2nd trials with CGA; no UE support  3rd trial with CGA--improved R LE mechanics and strength noted on 3rd trial); kept foot on step during activity Sit to stands on [employer]: 20.5 inch mat table; x10 reps; x2 sets; SBA; no UE support; vc's for increasing R LE WB'ing.  HR 92 bpm post activity  PATIENT EDUCATION:  Education details:  STG's and LTG's.  Continue HEP. Person educated: Patient Education method: Explanation and Verbal cues Education comprehension: verbalized understanding  HOME EXERCISE PROGRAM: Access Code: [email or web address] URL: [email or web address] Date: 12/22/2023 Prepared by: Damien Caulk  Exercises - Standing March with Counter Support  - 1 x daily - 5-7 x weekly - 3 sets - 10 reps - Standing Hip Abduction with Unilateral Counter Support  - 1 x daily - 5 x weekly - 2 sets - 5 reps - Heel Raises with Counter Support  - 1 x daily - 5-7 x weekly - 3 sets - 10 reps - Supine Bridge with Mini Swiss Ball Between Knees  - 1 x daily - 5 x weekly - 2-3 sets - 10 reps  GOALS: Goals reviewed with patient? Yes  SHORT TERM GOALS: Target date: 12/26/23 (assessed 12/25/23)  Pt will be independent with initial HEP in order to improve strength and balance in order to decrease fall  risk and improve function at home for ADL's and at work.  Baseline: Goal status: MET (reviewed and discussed with pt)  2.  Assess (to demonstrate improvement in cardiopulmonary endurance and community ambulation).  Baseline: 671 feet 12/01/23 Goal status: MET (assessed 12/01/23)  3.  Assess Functional Gait Assessment (to reduce fall risk and improve dynamic gait safety with community ambulation). Baseline: 11/30 (12/01/23) Goal status: MET (assessed 12/01/23)  4.  Pt will demonstrate improved R foot clearance for safety with gait.  Baseline:  Goal status: MET (pt reports improvement; pt's wife and friend also commenting to pt regarding improvement; improvement also noted via observation in clinic)    LONG TERM GOALS: Target date: 01/23/24  Pt will decrease 5 Time Sit to Stand by at least 3 seconds (and quality of movement) in order to demonstrate clinically significant improvement in LE strength.  Baseline: 14.19 seconds (decreased control/quality of movement R LE 3rd-5th stand; use of L UE) 11/24/23 Goal status: INITIAL  2.  Pt will be independent with final HEP in order to improve strength and balance in order to decrease fall risk and improve function at home for ADL's and at work.  Baseline:  Goal status: INITIAL  3.  Patient will be independent in bending down towards floor and picking up small object (<5 pounds) and then stand back up without loss of balance as to improve ability to pick up and clean up room at home.  Baseline:  Goal status: INITIAL  4.  Pt will increase by at least 46m (180ft) in order to demonstrate clinically significant improvement in cardiopulmonary endurance and community ambulation.  Baseline: 671 feet 12/01/23 Goal status: INITIAL  5.  Patient will increase Functional Gait Assessment score to >20/30 as to reduce fall risk and improve dynamic gait safety with community ambulation.  Baseline: 11/30 (12/01/23) Goal status:  INITIAL   ASSESSMENT:  CLINICAL IMPRESSION: Patient was seen today for physical therapy treatment to address STG's, strengthening, and transfers.  In terms of STG's, pt met goals for being independent with initial HEP and improving R foot clearance with ambulation; pt also met goal of assessing and FGA (previously assessed for objective measurement for LTG's).  LTG's reviewed with pt;  pt verbalizing agreement.  Focused session on LE strengthening, improving LE muscle activation and balance with lateral step ups, and improving balance and technique with sit to stands on compliant surface (improved WB'ing noted through R LE with activity).  Pt reporting feeling stiff/sore in LE's after therapy earlier in week and doing HEP at home but reported feeling better in general end of session.  They demonstrate improvement in R LE muscle activation/strength with R lateral step ups with repetition.  They would continue to benefit from skilled PT to address impairments as noted and progress towards long term goals.  OBJECTIVE IMPAIRMENTS: Abnormal gait, decreased activity tolerance, decreased balance, decreased cognition, decreased coordination, decreased endurance, decreased knowledge of condition, decreased knowledge of use of DME, decreased mobility, difficulty walking, decreased strength, impaired tone, and impaired UE functional use.   ACTIVITY LIMITATIONS: carrying, lifting, bending, sitting, standing, squatting, stairs, transfers, continence, bathing, toileting, dressing, reach over head, locomotion level, and caring for others  PARTICIPATION LIMITATIONS: meal prep, cleaning, laundry, driving, shopping, community activity, occupation, and yard work  PERSONAL FACTORS: Age, Past/current experiences, Time since onset of injury/illness/exacerbation, and 3+ comorbidities: PMH including Left temporal glioblastoma, SDH L, spastic hemiplegia, seizures, visual cut R side are also affecting patient's functional  outcome.   REHAB POTENTIAL: Good  CLINICAL DECISION MAKING: Evolving/moderate complexity  EVALUATION COMPLEXITY: Moderate  PLAN:  PT FREQUENCY: 2x/week  PT DURATION: 8 weeks  PLANNED INTERVENTIONS: [id number]- PT Re-evaluation, 97750- Physical Performance Testing, 97110-Therapeutic exercises, 97530- Therapeutic activity, [id number]- Neuromuscular re-education, 97535- Self Care, [id number]- Manual therapy, [id number]- Gait training, [phone]- Orthotic Initial, [phone]- Orthotic/Prosthetic subsequent, [phone]- Aquatic Therapy, [phone]- Electrical stimulation (manual), [phone]- Ultrasound, Patient/Family education, Balance training, Stair training, Joint mobilization, DME instructions, Cryotherapy, and Moist heat  PLAN FOR NEXT SESSION: 10th visit progress note due; Check on HEP; LE strengthening; SCI-FIT for strengthening and reciprocal movement; SLS activities; balance; increase R LE step length and foot clearance; leg press?; tall kneeling?   Damien Caulk, PT 12/25/2023, 3:24 PM

## 2023-12-29 ENCOUNTER — Ambulatory Visit: Admitting: Physical Therapy

## 2023-12-29 ENCOUNTER — Encounter: Payer: Self-pay | Admitting: Physical Therapy

## 2023-12-29 VITALS — BP 128/83 | HR 73

## 2023-12-29 DIAGNOSIS — R262 Difficulty in walking, not elsewhere classified: Secondary | ICD-10-CM

## 2023-12-29 DIAGNOSIS — R29818 Other symptoms and signs involving the nervous system: Secondary | ICD-10-CM

## 2023-12-29 DIAGNOSIS — R260 Ataxic gait: Secondary | ICD-10-CM | POA: Diagnosis not present

## 2023-12-29 DIAGNOSIS — C719 Malignant neoplasm of brain, unspecified: Secondary | ICD-10-CM

## 2023-12-29 NOTE — Therapy (Signed)
 OUTPATIENT PHYSICAL THERAPY NEURO TREATMENT   10th Visit Physical Therapy Progress Note   Dates of Reporting Period: 11/24/23 - 12/29/23  See Note below for Objective Data and Assessment of Progress/Goals.  Thank you for the referral of this patient. Damien Caulk, PT   Patient Name: [name] MRN: [id number] DOB:1960-11-24, 63 y.o., male Today's Date: 12/29/2023   PCP: Dwight Trula SQUIBB, MD REFERRING PROVIDER: Camalier, Curry Scarce, NP  END OF SESSION:  PT End of Session - 12/29/23 1434     Visit Number 10    Number of Visits 17   16 plus Eval   Date for Recertification  01/23/24    Authorization Type [employer]    Authorization Time Period 12/01/23 - 01/26/24    Authorization - Visit Number 10    Authorization - Number of Visits 17    Progress Note Due on Visit 10    PT Start Time 1422   pt arrived on time but was not checked in   PT Stop Time 1521    PT Time Calculation (min) 59 min    Equipment Utilized During Treatment Gait belt    Activity Tolerance Patient tolerated treatment well    Behavior During Therapy [facility] for tasks assessed/performed             Past Medical History:  Diagnosis Date   ALLERGIC RHINITIS 01/09/2009   Glioblastoma multiforme (HCC)    stage IV   HYPERTENSION 01/09/2009   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   Left Temple   SEIZURE DISORDER 01/09/2009   Thyroid  disease    Hypothyroidism   Past Surgical History:  Procedure Laterality Date   CRANIOTOMY  2009   glioblastoma   Patient Active Problem List   Diagnosis Date Noted   Hypothyroidism 04/28/2015   GBM (glioblastoma multiforme) (HCC) 01/03/2011   Essential hypertension 01/09/2009   Allergic rhinitis 01/09/2009   SEIZURE DISORDER 01/09/2009    ONSET DATE: 2010 Stage 4 brain CA per pt report  REFERRING DIAG: C71.2 (ICD-10-CM) - Malignant neoplasm of temporal lobe  THERAPY DIAG:  Other symptoms and signs involving the nervous system  Difficulty  in walking, not elsewhere classified  GBM (glioblastoma multiforme) (HCC)  Ataxic gait  Rationale for Evaluation and Treatment: Rehabilitation  SUBJECTIVE:                                                                                                                                                                                             SUBJECTIVE STATEMENT: Pt reports he stood for 2 1/2 hours to listen to band and got in a lot of  exercise this weekend.  No recent falls.  No acute changes reported. 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  PMH also includes: Spastic hemiplegia affecting right dominant side (CMS/HHS-HCC), sub-acute Cerebral infarct (CMS-HCC), seizures, htn, anemia, asthma, hypopotassemia.  Pt also reports having visual cut R side.  PAIN:  Are you having pain? Yes: NPRS scale: 0/10 currently Pain location: R UE and B LE's Pain description: stiff; tight Aggravating factors: not exercising or exercising too much Relieving factors: movement (not too much or too little)  PRECAUTIONS: Fall; R field visual cut  RED FLAGS: None   WEIGHT BEARING RESTRICTIONS: No  FALLS: Has patient fallen in last 6 months? No  LIVING ENVIRONMENT: Lives with: lives with their spouse Lives in: House/apartment Stairs: No Has following equipment at home: Single point cane, Quad cane small base, Walker - 2 wheeled, Crutches, shower chair, bed side commode, and Grab bars  PLOF: Independent with gait and Independent with transfers; assist putting on the belt, coat, and tie (extra time/effort to perform on own)  PATIENT GOALS: 5 years from now I still want to be walking.  Improve walking pattern and balance.  Don't want to regress; want  to get better.  OBJECTIVE:  Note: Objective measures were completed at Evaluation unless otherwise noted.  DIAGNOSTIC FINDINGS: 11/11/23 MRI Brain with and without contrast: IMPRESSION: Stable postoperative changes within the left temporal lobe. Compared to 07/29/2023, similar enhancement along the resection margin and inferomedial left temporal lobe. No evidence of local recurrence or metastatic disease within the brain. BT-RADS 2a.   COGNITION: Overall cognitive status: Within functional limits for tasks assessed   SENSATION: Not tested (pt reports no concerns)  COORDINATION: Impaired R LE heel to shin in sitting  MUSCLE TONE: RLE: Hypertonic  POSTURE: rounded shoulders and forward head  LOWER EXTREMITY ROM:     Active  Right Eval Left Eval  Hip flexion [facility]  Hip extension    Hip abduction [facility]  Hip adduction    Hip internal rotation    Hip external rotation    Knee flexion [facility] WFL  Knee extension [facility] WFL  Ankle dorsiflexion WFL (*PROM) WFL  Ankle plantarflexion    Ankle inversion    Ankle eversion     (Blank rows = not tested)  LOWER EXTREMITY MMT:    MMT Right Eval Left Eval  Hip flexion 4+/5 5/5  Hip extension    Hip abduction    Hip adduction    Hip internal rotation    Hip external rotation    Knee flexion 4-/5 5/5  Knee extension 4-/5 5/5  Ankle dorsiflexion 2+/5 5/5  Ankle plantarflexion 2+/5 >3/5  Ankle inversion    Ankle eversion    (Blank rows = not tested)  BED MOBILITY:  Not tested  Pt reports being independent at home  TRANSFERS:  Requires L UE support Sit to stand: SBA  Assistive device utilized: None     Stand to sit: SBA  Assistive device utilized: None     Chair to chair: SBA  Assistive device utilized: None       STAIRS: Not tested.  Pt reports able to do stairs with use of railing. GAIT: Findings: Gait Characteristics: step through pattern, decreased arm swing- Right, decreased arm swing-  Left, decreased step length-  Right, decreased step length- Left, decreased stance time- Right, decreased stride length, decreased hip/knee flexion- Right, decreased ankle dorsiflexion- Right, circumduction- Right, Right foot flat, ataxic, decreased trunk rotation, and poor foot clearance- Right, Distance walked: Clinic distances, Assistive device utilized:None, Level of assistance: SBA, and Comments: R LE externally rotated; R UE flexed and at side; increased L lateral weight shift  FUNCTIONAL TESTS:  5 times sit to stand: 14.19 seconds (decreased control/quality of movement R LE 3rd-5th stand); use of L UE; on Eval Timed up and go (TUG): 12.35 seconds (did trial run first); on Eval 6 minute walk test: 671 feet (decreased R foot clearance and gait speed with increased distance ambulating) 12/01/23 Functional gait assessment: 11/30 (12/01/23)  PATIENT SURVEYS:  ABC scale: 70.31% 12/15/23 The Activities-Specific Balance Confidence (ABC) Scale 0% 10 20 30  40 50 60 70 80 90 100% No confidence<->completely confident  "How confident are you that you will not lose your balance or become unsteady when you . . .   Date tested 12/15/23  Walk around the house 75%  2. Walk up or down stairs 70% with use of railing  3. Bend over and pick up a slipper from in front of a closet floor 50%  4. Reach for a small can off a shelf at eye level 100%  5. Stand on tip toes and reach for something above your head 50%  6. Stand on a chair and reach for something 50%  7. Sweep the floor 100%  8. Walk outside the house to a car parked in the driveway 100%  9. Get into or out of a car 30% (getting into car no problem; difficulties getting out of car)  10. Walk across a parking lot to the mall 80%  11. Walk up or down a ramp 80%  12. Walk in a crowded mall where people rapidly walk past you 50%  13. Are bumped into by people as you walk through the mall 70%  14. Step onto or off of an escalator while you are holding onto the railing 100%  15.  Step onto or off an escalator while holding onto parcels such that you cannot hold onto the railing 70%  16. Walk outside on icy sidewalks 50%  Total: 1125/16 70.31%                                                                                                                                 TREATMENT DATE: 12/29/23  Self Care: BP and HR taken in sitting at rest beginning of session (see below for details). Vitals:   12/29/23 1428  BP: 128/83  Pulse: 73   Therapeutic Exercise: SciFit multi-peaks up to level 8 for 8 minutes using BUE/BLEs for neural priming for reciprocal movement, dynamic cardiovascular warmup and increased amplitude of stepping. RPE of 4-5/10 following activity.  HR 72 bpm post activity.  Therapeutic Activity: 1/2 kneeling (L  knee down) on floor mat: x10 reps x2 sets 5# kettle bell (movement L thigh towards R shoulder with B hand hold on kettle bell); x10 reps x2 sets B shoulder flexion (approximately 70-80 degrees) with 5# kettle bell (holding kettle bell with B hands).  Performed to improve proximal hip and core stability. Deferred R side (unable to safely get into 1/2 kneeling position with R knee down) Ambulation: x345 feet carrying 10# kettle bell in B hands (arms down with kettle bell just anterior to body); no AD use; CGA Ambulation: x345 feet with B 4# ankle weights; no AD use; CGA Steps ups (6 inch step; 4# ankle weights B): x10 reps x2 sets R LE (keeping R foot on step) x10 reps x2 sets L LE (keeping L foot on step) Ambulation (purple resistance band around pelvis with fluctuating resistance and perturbations by therapist): x280 feet; close SBA for safety; pt able to self correct  PATIENT EDUCATION:  Education details:  Continue HEP. Person educated: Patient Education method: Explanation and Verbal cues Education comprehension: verbalized understanding  HOME EXERCISE PROGRAM: Access Code: [email or web address] URL: [email or web address] Date:  12/22/2023 Prepared by: Damien Caulk  Exercises - Standing March with Counter Support  - 1 x daily - 5-7 x weekly - 3 sets - 10 reps - Standing Hip Abduction with Unilateral Counter Support  - 1 x daily - 5 x weekly - 2 sets - 5 reps - Heel Raises with Counter Support  - 1 x daily - 5-7 x weekly - 3 sets - 10 reps - Supine Bridge with Mini Swiss Ball Between Knees  - 1 x daily - 5 x weekly - 2-3 sets - 10 reps  GOALS: Goals reviewed with patient? Yes  SHORT TERM GOALS: Target date: 12/26/23 (assessed 12/25/23)  Pt will be independent with initial HEP in order to improve strength and balance in order to decrease fall risk and improve function at home for ADL's and at work.  Baseline: Goal status: MET (reviewed and discussed with pt)  2.  Assess (to demonstrate improvement in cardiopulmonary endurance and community ambulation).  Baseline: 671 feet 12/01/23 Goal status: MET (assessed 12/01/23)  3.  Assess Functional Gait Assessment (to reduce fall risk and improve dynamic gait safety with community ambulation). Baseline: 11/30 (12/01/23) Goal status: MET (assessed 12/01/23)  4.  Pt will demonstrate improved R foot clearance for safety with gait.  Baseline:  Goal status: MET (pt reports improvement; pt's wife and friend also commenting to pt regarding improvement; improvement also noted via observation in clinic)    LONG TERM GOALS: Target date: 01/23/24  Pt will decrease 5 Time Sit to Stand by at least 3 seconds (and quality of movement) in order to demonstrate clinically significant improvement in LE strength.  Baseline: 14.19 seconds (decreased control/quality of movement R LE 3rd-5th stand; use of L UE) 11/24/23 Goal status: INITIAL  2.  Pt will be independent with final HEP in order to improve strength and balance in order to decrease fall risk and improve function at home for ADL's and at work.  Baseline:  Goal status: INITIAL  3.  Patient will be independent in  bending down towards floor and picking up small object (<5 pounds) and then stand back up without loss of balance as to improve ability to pick up and clean up room at home.  Baseline:  Goal status: INITIAL  4.  Pt will increase by at least 67m (15ft) in order to demonstrate clinically significant improvement in  cardiopulmonary endurance and community ambulation.  Baseline: 671 feet 12/01/23 Goal status: INITIAL  5.  Patient will increase Functional Gait Assessment score to >20/30 as to reduce fall risk and improve dynamic gait safety with community ambulation.  Baseline: 11/30 (12/01/23) Goal status: INITIAL   ASSESSMENT:  CLINICAL IMPRESSION: Patient was seen today for physical therapy treatment to address strength, balance, and ambulation.  Focused session on improving proximal hip and core stability, LE strengthening, improving foot clearance with ambulation, and improving dynamic balance.  Patient continues to be limited by strength and balance.  They demonstrate improvement in balance with dynamic activities and also improvement in activity tolerance.  Pt reporting continued improvement in R foot clearance with ambulation.  Pt continues to appear very motivated to participate in therapy activities and improve status.  They would continue to benefit from skilled PT to address impairments as noted and progress towards long term goals.  OBJECTIVE IMPAIRMENTS: Abnormal gait, decreased activity tolerance, decreased balance, decreased cognition, decreased coordination, decreased endurance, decreased knowledge of condition, decreased knowledge of use of DME, decreased mobility, difficulty walking, decreased strength, impaired tone, and impaired UE functional use.   ACTIVITY LIMITATIONS: carrying, lifting, bending, sitting, standing, squatting, stairs, transfers, continence, bathing, toileting, dressing, reach over head, locomotion level, and caring for others  PARTICIPATION LIMITATIONS:  meal prep, cleaning, laundry, driving, shopping, community activity, occupation, and yard work  PERSONAL FACTORS: Age, Past/current experiences, Time since onset of injury/illness/exacerbation, and 3+ comorbidities: PMH including Left temporal glioblastoma, SDH L, spastic hemiplegia, seizures, visual cut R side are also affecting patient's functional outcome.   REHAB POTENTIAL: Good  CLINICAL DECISION MAKING: Evolving/moderate complexity  EVALUATION COMPLEXITY: Moderate  PLAN:  PT FREQUENCY: 2x/week  PT DURATION: 8 weeks  PLANNED INTERVENTIONS: [id number]- PT Re-evaluation, 97750- Physical Performance Testing, 97110-Therapeutic exercises, 97530- Therapeutic activity, [id number]- Neuromuscular re-education, 97535- Self Care, [id number]- Manual therapy, [id number]- Gait training, [id number]- Orthotic Initial, [id number]- Orthotic/Prosthetic subsequent, [phone]- Aquatic Therapy, [phone]- Electrical stimulation (manual), [phone]- Ultrasound, Patient/Family education, Balance training, Stair training, Joint mobilization, DME instructions, Cryotherapy, and Moist heat  PLAN FOR NEXT SESSION: Check on HEP; LE strengthening; SCI-FIT for strengthening and reciprocal movement; SLS activities; balance; increase R LE step length and foot clearance; leg press?; tall kneeling?   Damien Caulk, PT 12/29/2023, 3:56 PM

## 2024-01-01 ENCOUNTER — Encounter: Payer: Self-pay | Admitting: Physical Therapy

## 2024-01-01 ENCOUNTER — Ambulatory Visit: Admitting: Physical Therapy

## 2024-01-01 VITALS — BP 133/77 | HR 74

## 2024-01-01 DIAGNOSIS — R29818 Other symptoms and signs involving the nervous system: Secondary | ICD-10-CM

## 2024-01-01 DIAGNOSIS — C719 Malignant neoplasm of brain, unspecified: Secondary | ICD-10-CM

## 2024-01-01 DIAGNOSIS — R260 Ataxic gait: Secondary | ICD-10-CM

## 2024-01-01 DIAGNOSIS — R262 Difficulty in walking, not elsewhere classified: Secondary | ICD-10-CM

## 2024-01-01 NOTE — Therapy (Signed)
 OUTPATIENT PHYSICAL THERAPY NEURO TREATMENT  Patient Name: [name] MRN: [id number] DOB:[birth year], 63 y.o., male Today's Date: 01/01/2024   PCP: Dwight Trula SQUIBB, MD REFERRING PROVIDER: Camalier, Curry Scarce, NP  END OF SESSION:  PT End of Session - 01/01/24 1408     Visit Number 11    Number of Visits 17   16 plus Eval   Date for Recertification  01/23/24    Authorization Type [employer]    Authorization Time Period 12/01/23 - 01/26/24    Authorization - Visit Number 11    Authorization - Number of Visits 17    Progress Note Due on Visit 10    PT Start Time 1407   Therapist running late   PT Stop Time 1452    PT Time Calculation (min) 45 min    Equipment Utilized During Treatment Gait belt    Activity Tolerance Patient tolerated treatment well    Behavior During Therapy WFL for tasks assessed/performed             Past Medical History:  Diagnosis Date   ALLERGIC RHINITIS 01/09/2009   Glioblastoma multiforme (HCC)    stage IV   HYPERTENSION 01/09/2009   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   Left Temple   SEIZURE DISORDER 01/09/2009   Thyroid  disease    Hypothyroidism   Past Surgical History:  Procedure Laterality Date   CRANIOTOMY  2009   glioblastoma   Patient Active Problem List   Diagnosis Date Noted   Hypothyroidism 04/28/2015   GBM (glioblastoma multiforme) (HCC) 01/03/2011   Essential hypertension 01/09/2009   Allergic rhinitis 01/09/2009   SEIZURE DISORDER 01/09/2009    ONSET DATE: 2010 Stage 4 brain CA per pt report  REFERRING DIAG: C71.2 (ICD-10-CM) - Malignant neoplasm of temporal lobe  THERAPY DIAG:  Other symptoms and signs involving the nervous system  Difficulty in walking, not elsewhere classified  GBM (glioblastoma multiforme) (HCC)  Ataxic gait  Rationale for Evaluation and Treatment: Rehabilitation  SUBJECTIVE:                                                                                                                                                                                              SUBJECTIVE STATEMENT: Had urologist appointment yesterday; follow up appointment on Monday.  No recent falls.  No pain.  Doing good.  No acute changes. 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  PMH also includes: Spastic hemiplegia affecting right dominant side (CMS/HHS-HCC), sub-acute Cerebral infarct (CMS-HCC), seizures, htn, anemia, asthma, hypopotassemia.  Pt also reports having visual cut R side.  PAIN:  Are you having pain? Yes: NPRS scale: 0/10 currently Pain location: R UE and B LE's Pain description: stiff; tight Aggravating factors: not exercising or exercising too much Relieving factors: movement (not too much or too little)  PRECAUTIONS: Fall; R field visual cut  RED FLAGS: None   WEIGHT BEARING RESTRICTIONS: No  FALLS: Has patient fallen in last 6 months? No  LIVING ENVIRONMENT: Lives with: lives with their spouse Lives in: House/apartment Stairs: No Has following equipment at home: Single point cane, Quad cane small base, Walker - 2 wheeled, Crutches, shower chair, bed side commode, and Grab bars  PLOF: Independent with gait and Independent with transfers; assist putting on the belt, coat, and tie (extra time/effort to perform on own)  PATIENT GOALS: 5 years from now I still want to be walking.  Improve walking pattern and balance.  Don't want to regress; want to get better.  OBJECTIVE:  Note: Objective measures were completed at Evaluation unless otherwise noted.  DIAGNOSTIC FINDINGS: 11/11/23 MRI Brain with and without contrast: IMPRESSION: Stable postoperative changes within the left temporal lobe. Compared  to 07/29/2023, similar enhancement along the resection margin and inferomedial left temporal lobe. No evidence of local recurrence or metastatic disease within the brain. BT-RADS 2a.   COGNITION: Overall cognitive status: Within functional limits for tasks assessed   SENSATION: Not tested (pt reports no concerns)  COORDINATION: Impaired R LE heel to shin in sitting  MUSCLE TONE: RLE: Hypertonic  POSTURE: rounded shoulders and forward head  LOWER EXTREMITY ROM:     Active  Right Eval Left Eval  Hip flexion [facility]  Hip extension    Hip abduction [facility]  Hip adduction    Hip internal rotation    Hip external rotation    Knee flexion [facility] WFL  Knee extension [facility] WFL  Ankle dorsiflexion WFL (*PROM) WFL  Ankle plantarflexion    Ankle inversion    Ankle eversion     (Blank rows = not tested)  LOWER EXTREMITY MMT:    MMT Right Eval Left Eval  Hip flexion 4+/5 5/5  Hip extension    Hip abduction    Hip adduction    Hip internal rotation    Hip external rotation    Knee flexion 4-/5 5/5  Knee extension 4-/5 5/5  Ankle dorsiflexion 2+/5 5/5  Ankle plantarflexion 2+/5 >3/5  Ankle inversion    Ankle eversion    (Blank rows = not tested)  BED MOBILITY:  Not tested  Pt reports being independent at home  TRANSFERS:  Requires L UE support Sit to stand: SBA  Assistive device utilized: None     Stand to sit: SBA  Assistive device utilized: None     Chair to chair: SBA  Assistive device utilized: None       STAIRS: Not tested.  Pt reports able to do stairs with use of railing. GAIT: Findings: Gait Characteristics: step through pattern, decreased arm swing- Right, decreased arm swing- Left, decreased step length- Right, decreased step length- Left, decreased stance time- Right, decreased stride length, decreased hip/knee flexion- Right, decreased ankle dorsiflexion- Right, circumduction- Right, Right foot flat, ataxic, decreased trunk rotation, and poor foot clearance-  Right, Distance walked: Clinic distances, Assistive device utilized:None, Level of assistance: SBA, and Comments: R LE  externally rotated; R UE flexed and at side; increased L lateral weight shift  FUNCTIONAL TESTS:  5 times sit to stand: 14.19 seconds (decreased control/quality of movement R LE 3rd-5th stand); use of L UE; on Eval Timed up and go (TUG): 12.35 seconds (did trial run first); on Eval 6 minute walk test: 671 feet (decreased R foot clearance and gait speed with increased distance ambulating) 12/01/23 Functional gait assessment: 11/30 (12/01/23)  PATIENT SURVEYS:  ABC scale: 70.31% 12/15/23 The Activities-Specific Balance Confidence (ABC) Scale 0% 10 20 30  40 50 60 70 80 90 100% No confidence<->completely confident  "How confident are you that you will not lose your balance or become unsteady when you . . .   Date tested 12/15/23  Walk around the house 75%  2. Walk up or down stairs 70% with use of railing  3. Bend over and pick up a slipper from in front of a closet floor 50%  4. Reach for a small can off a shelf at eye level 100%  5. Stand on tip toes and reach for something above your head 50%  6. Stand on a chair and reach for something 50%  7. Sweep the floor 100%  8. Walk outside the house to a car parked in the driveway 100%  9. Get into or out of a car 30% (getting into car no problem; difficulties getting out of car)  10. Walk across a parking lot to the mall 80%  11. Walk up or down a ramp 80%  12. Walk in a crowded mall where people rapidly walk past you 50%  13. Are bumped into by people as you walk through the mall 70%  14. Step onto or off of an escalator while you are holding onto the railing 100%  15. Step onto or off an escalator while holding onto parcels such that you cannot hold onto the railing 70%  16. Walk outside on icy sidewalks 50%  Total: 1125/16 70.31%                                                                                                                                  TREATMENT DATE: 01/01/24  Self Care: BP and HR taken in sitting at rest beginning of session (see below for details). Vitals:   01/01/24 1412  BP: 133/77  Pulse: 74    Therapeutic Exercise: SciFit multi-peaks up to level 8 for 8 minutes using BUE/BLEs for neural priming for reciprocal movement, dynamic cardiovascular warmup and increased amplitude of stepping. RPE of 2/10 and HR of 84 bpm following activity.  Average stride 11.2 inches.   Therapeutic Activity: Ambulation (purple resistance band around pelvis with fluctuating resistance and perturbations by therapist): 2x280 feet; close SBA for safety; pt able to self correct Gum drop taps with LE's  (6 gum drops in semi-circle) with [employer] resistance band around pelvis for resistance: x7 reps x2 sets; (1 rep =  gum drop tap forward, diagonal, and lateral with R LE followed by gum drop tap forward, diagonal, and lateral with L LE); CGA; pt able to self correct balance.  Notes:  HR 108 bpm immediately post activity but recovered to 88 bpm within a few minutes of sitting rest  PATIENT EDUCATION:  Education details:  Continue HEP. Person educated: Patient Education method: Explanation and Verbal cues Education comprehension: verbalized understanding  HOME EXERCISE PROGRAM: Access Code: [email or web address] URL: [email or web address] Date: 12/22/2023 Prepared by: Damien Caulk  Exercises - Standing March with Counter Support  - 1 x daily - 5-7 x weekly - 3 sets - 10 reps - Standing Hip Abduction with Unilateral Counter Support  - 1 x daily - 5 x weekly - 2 sets - 5 reps - Heel Raises with Counter Support  - 1 x daily - 5-7 x weekly - 3 sets - 10 reps - Supine Bridge with Mini Swiss Ball Between Knees  - 1 x daily - 5 x weekly - 2-3 sets - 10 reps  GOALS: Goals reviewed with patient? Yes  SHORT TERM GOALS: Target date: 12/26/23 (assessed 12/25/23)  Pt will be independent with initial  HEP in order to improve strength and balance in order to decrease fall risk and improve function at home for ADL's and at work.  Baseline: Goal status: MET (reviewed and discussed with pt)  2.  Assess (to demonstrate improvement in cardiopulmonary endurance and community ambulation).  Baseline: 671 feet 12/01/23 Goal status: MET (assessed 12/01/23)  3.  Assess Functional Gait Assessment (to reduce fall risk and improve dynamic gait safety with community ambulation). Baseline: 11/30 (12/01/23) Goal status: MET (assessed 12/01/23)  4.  Pt will demonstrate improved R foot clearance for safety with gait.  Baseline:  Goal status: MET (pt reports improvement; pt's wife and friend also commenting to pt regarding improvement; improvement also noted via observation in clinic)    LONG TERM GOALS: Target date: 01/23/24  Pt will decrease 5 Time Sit to Stand by at least 3 seconds (and quality of movement) in order to demonstrate clinically significant improvement in LE strength.  Baseline: 14.19 seconds (decreased control/quality of movement R LE 3rd-5th stand; use of L UE) 11/24/23 Goal status: INITIAL  2.  Pt will be independent with final HEP in order to improve strength and balance in order to decrease fall risk and improve function at home for ADL's and at work.  Baseline:  Goal status: INITIAL  3.  Patient will be independent in bending down towards floor and picking up small object (<5 pounds) and then stand back up without loss of balance as to improve ability to pick up and clean up room at home.  Baseline:  Goal status: INITIAL  4.  Pt will increase by at least 7m (157ft) in order to demonstrate clinically significant improvement in cardiopulmonary endurance and community ambulation.  Baseline: 671 feet 12/01/23 Goal status: INITIAL  5.  Patient will increase Functional Gait Assessment score to >20/30 as to reduce fall risk and improve dynamic gait safety with community  ambulation.  Baseline: 11/30 (12/01/23) Goal status: INITIAL   ASSESSMENT:  CLINICAL IMPRESSION: Patient was seen today for physical therapy treatment to address balance and strength.  Focused session on LE strengthening and dynamic balance during ambulation and with single leg dynamic balance activities (with use of resistance band to challenge balance).  Pt reporting therapy activities were appropriately challenging and was able to self correct with balance activities.  Patient  continues to be limited by strength, balance, and R foot clearance.  They demonstrate improvement in balance with dynamic activities.  They would continue to benefit from skilled PT to address impairments as noted and progress towards long term goals.  OBJECTIVE IMPAIRMENTS: Abnormal gait, decreased activity tolerance, decreased balance, decreased cognition, decreased coordination, decreased endurance, decreased knowledge of condition, decreased knowledge of use of DME, decreased mobility, difficulty walking, decreased strength, impaired tone, and impaired UE functional use.   ACTIVITY LIMITATIONS: carrying, lifting, bending, sitting, standing, squatting, stairs, transfers, continence, bathing, toileting, dressing, reach over head, locomotion level, and caring for others  PARTICIPATION LIMITATIONS: meal prep, cleaning, laundry, driving, shopping, community activity, occupation, and yard work  PERSONAL FACTORS: Age, Past/current experiences, Time since onset of injury/illness/exacerbation, and 3+ comorbidities: PMH including Left temporal glioblastoma, SDH L, spastic hemiplegia, seizures, visual cut R side are also affecting patient's functional outcome.   REHAB POTENTIAL: Good  CLINICAL DECISION MAKING: Evolving/moderate complexity  EVALUATION COMPLEXITY: Moderate  PLAN:  PT FREQUENCY: 2x/week  PT DURATION: 8 weeks  PLANNED INTERVENTIONS: [id number]- PT Re-evaluation, 97750- Physical Performance Testing,  97110-Therapeutic exercises, 97530- Therapeutic activity, [id number]- Neuromuscular re-education, 97535- Self Care, [id number]- Manual therapy, [id number]- Gait training, [id number]- Orthotic Initial, [id number]- Orthotic/Prosthetic subsequent, [phone]- Aquatic Therapy, [phone]- Electrical stimulation (manual), [phone]- Ultrasound, Patient/Family education, Balance training, Stair training, Joint mobilization, DME instructions, Cryotherapy, and Moist heat  PLAN FOR NEXT SESSION: Check on HEP; LE strengthening; SCI-FIT for strengthening and reciprocal movement; SLS activities; balance; increase R LE step length and foot clearance; leg press?; tall kneeling?   Damien Caulk, PT 01/01/2024, 3:18 PM

## 2024-01-05 ENCOUNTER — Encounter: Payer: Self-pay | Admitting: Physical Therapy

## 2024-01-05 ENCOUNTER — Ambulatory Visit: Admitting: Physical Therapy

## 2024-01-05 VITALS — BP 114/76 | HR 89

## 2024-01-05 DIAGNOSIS — R262 Difficulty in walking, not elsewhere classified: Secondary | ICD-10-CM

## 2024-01-05 DIAGNOSIS — R260 Ataxic gait: Secondary | ICD-10-CM

## 2024-01-05 DIAGNOSIS — C719 Malignant neoplasm of brain, unspecified: Secondary | ICD-10-CM

## 2024-01-05 DIAGNOSIS — R29818 Other symptoms and signs involving the nervous system: Secondary | ICD-10-CM

## 2024-01-05 NOTE — Therapy (Signed)
 OUTPATIENT PHYSICAL THERAPY NEURO TREATMENT  Patient Name: [name] MRN: [id number] DOB:Apr 23, 1960, 63 y.o., male Today's Date: 01/05/2024   PCP: Dwight Trula SQUIBB, MD REFERRING PROVIDER: Camalier, Curry Scarce, NP  END OF SESSION:  PT End of Session - 01/05/24 1231     Visit Number 12    Number of Visits 17   16 plus Eval   Date for Recertification  01/23/24    Authorization Type [employer]    Authorization Time Period 12/01/23 - 01/26/24    Authorization - Visit Number 12    Authorization - Number of Visits 17    Progress Note Due on Visit 10    PT Start Time 1230    PT Stop Time 1316    PT Time Calculation (min) 46 min    Equipment Utilized During Treatment Gait belt    Activity Tolerance Patient tolerated treatment well    Behavior During Therapy WFL for tasks assessed/performed             Past Medical History:  Diagnosis Date   ALLERGIC RHINITIS 01/09/2009   Glioblastoma multiforme (HCC)    stage IV   HYPERTENSION 01/09/2009   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   Left Temple   SEIZURE DISORDER 01/09/2009   Thyroid  disease    Hypothyroidism   Past Surgical History:  Procedure Laterality Date   CRANIOTOMY  2009   glioblastoma   Patient Active Problem List   Diagnosis Date Noted   Hypothyroidism 04/28/2015   GBM (glioblastoma multiforme) (HCC) 01/03/2011   Essential hypertension 01/09/2009   Allergic rhinitis 01/09/2009   SEIZURE DISORDER 01/09/2009    ONSET DATE: 2010 Stage 4 brain CA per pt report  REFERRING DIAG: C71.2 (ICD-10-CM) - Malignant neoplasm of temporal lobe  THERAPY DIAG:  Other symptoms and signs involving the nervous system  Difficulty in walking, not elsewhere classified  GBM (glioblastoma multiforme) (HCC)  Ataxic gait  Rationale for Evaluation and Treatment: Rehabilitation  SUBJECTIVE:                                                                                                                                                                                              SUBJECTIVE STATEMENT: Has urologist appointment this afternoon.  No recent falls.  No pain.  No acute changes. 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  PMH also includes: Spastic hemiplegia affecting  right dominant side (CMS/HHS-HCC), sub-acute Cerebral infarct (CMS-HCC), seizures, htn, anemia, asthma, hypopotassemia.  Pt also reports having visual cut R side.  PAIN:  Are you having pain? Yes: NPRS scale: 0/10 currently Pain location: R UE and B LE's Pain description: stiff; tight Aggravating factors: not exercising or exercising too much Relieving factors: movement (not too much or too little)  PRECAUTIONS: Fall; R field visual cut  RED FLAGS: None   WEIGHT BEARING RESTRICTIONS: No  FALLS: Has patient fallen in last 6 months? No  LIVING ENVIRONMENT: Lives with: lives with their spouse Lives in: House/apartment Stairs: No Has following equipment at home: Single point cane, Quad cane small base, Walker - 2 wheeled, Crutches, shower chair, bed side commode, and Grab bars  PLOF: Independent with gait and Independent with transfers; assist putting on the belt, coat, and tie (extra time/effort to perform on own)  PATIENT GOALS: 5 years from now I still want to be walking.  Improve walking pattern and balance.  Don't want to regress; want to get better.  OBJECTIVE:  Note: Objective measures were completed at Evaluation unless otherwise noted.  DIAGNOSTIC FINDINGS: 11/11/23 MRI Brain with and without contrast: IMPRESSION: Stable postoperative changes within the left temporal lobe. Compared to 07/29/2023, similar enhancement along the resection margin and  inferomedial left temporal lobe. No evidence of local recurrence or metastatic disease within the brain. BT-RADS 2a.   COGNITION: Overall cognitive status: Within functional limits for tasks assessed   SENSATION: Not tested (pt reports no concerns)  COORDINATION: Impaired R LE heel to shin in sitting  MUSCLE TONE: RLE: Hypertonic  POSTURE: rounded shoulders and forward head  LOWER EXTREMITY ROM:     Active  Right Eval Left Eval  Hip flexion [facility]  Hip extension    Hip abduction [facility]  Hip adduction    Hip internal rotation    Hip external rotation    Knee flexion [facility] WFL  Knee extension [facility] WFL  Ankle dorsiflexion WFL (*PROM) WFL  Ankle plantarflexion    Ankle inversion    Ankle eversion     (Blank rows = not tested)  LOWER EXTREMITY MMT:    MMT Right Eval Left Eval  Hip flexion 4+/5 5/5  Hip extension    Hip abduction    Hip adduction    Hip internal rotation    Hip external rotation    Knee flexion 4-/5 5/5  Knee extension 4-/5 5/5  Ankle dorsiflexion 2+/5 5/5  Ankle plantarflexion 2+/5 >3/5  Ankle inversion    Ankle eversion    (Blank rows = not tested)  BED MOBILITY:  Not tested  Pt reports being independent at home  TRANSFERS:  Requires L UE support Sit to stand: SBA  Assistive device utilized: None     Stand to sit: SBA  Assistive device utilized: None     Chair to chair: SBA  Assistive device utilized: None       STAIRS: Not tested.  Pt reports able to do stairs with use of railing. GAIT: Findings: Gait Characteristics: step through pattern, decreased arm swing- Right, decreased arm swing- Left, decreased step length- Right, decreased step length- Left, decreased stance time- Right, decreased stride length, decreased hip/knee flexion- Right, decreased ankle dorsiflexion- Right, circumduction- Right, Right foot flat, ataxic, decreased trunk rotation, and poor foot clearance- Right, Distance walked: Clinic distances, Assistive device  utilized:None, Level of assistance: SBA, and Comments: R LE externally rotated; R UE flexed and at side; increased L lateral  weight shift  FUNCTIONAL TESTS:  5 times sit to stand: 14.19 seconds (decreased control/quality of movement R LE 3rd-5th stand); use of L UE; on Eval Timed up and go (TUG): 12.35 seconds (did trial run first); on Eval 6 minute walk test: 671 feet (decreased R foot clearance and gait speed with increased distance ambulating) 12/01/23 Functional gait assessment: 11/30 (12/01/23)  PATIENT SURVEYS:  ABC scale: 70.31% 12/15/23 The Activities-Specific Balance Confidence (ABC) Scale 0% 10 20 30  40 50 60 70 80 90 100% No confidence<->completely confident  "How confident are you that you will not lose your balance or become unsteady when you . . .   Date tested 12/15/23  Walk around the house 75%  2. Walk up or down stairs 70% with use of railing  3. Bend over and pick up a slipper from in front of a closet floor 50%  4. Reach for a small can off a shelf at eye level 100%  5. Stand on tip toes and reach for something above your head 50%  6. Stand on a chair and reach for something 50%  7. Sweep the floor 100%  8. Walk outside the house to a car parked in the driveway 100%  9. Get into or out of a car 30% (getting into car no problem; difficulties getting out of car)  10. Walk across a parking lot to the mall 80%  11. Walk up or down a ramp 80%  12. Walk in a crowded mall where people rapidly walk past you 50%  13. Are bumped into by people as you walk through the mall 70%  14. Step onto or off of an escalator while you are holding onto the railing 100%  15. Step onto or off an escalator while holding onto parcels such that you cannot hold onto the railing 70%  16. Walk outside on icy sidewalks 50%  Total: 1125/16 70.31%                                                                                                                                 TREATMENT DATE:  01/05/24  Self Care: BP and HR taken in sitting at rest beginning of session (see below for details). Vitals:   01/05/24 1235  BP: 114/76  Pulse: 89    Therapeutic Exercise: SciFit multi-peaks up to level 9 for 8 minutes using BUE/BLEs for neural priming for reciprocal movement, dynamic cardiovascular warmup and increased amplitude of stepping. RPE of 3/10 following activity.  Average stride length 11.1 inches.  Therapeutic Activity: Sit to stands from mat table (4 inch step under L foot to increase R LE WB'ing/activation); no UE support: x10 reps x 3 sets Walking in // bars (L UE support) to improve R LE foot clearance: Stepping over with R LE (4) 5 inch hurdles spaced over 6 feet: x6 trials Stepping over with R LE (4) 8 inch hurdles spaced over 6 feet: x4  trials Notes: catching R LE on 8 inch hurdles intermittently but pt able to self correct and clear hurdle (some compensation noted R hip/pelvis but overall good R hip flexion and knee flexion); CGA   PATIENT EDUCATION:  Education details:  Bring R LE brace next session to assess (received about 12 years ago; hasn't worn last 5 years cause it was aggravating/uncomfortable).  Continue HEP. Person educated: Patient Education method: Explanation and Verbal cues Education comprehension: verbalized understanding  HOME EXERCISE PROGRAM: Access Code: [email or web address] URL: [email or web address] Date: 12/22/2023 Prepared by: Damien Caulk  Exercises - Standing March with Counter Support  - 1 x daily - 5-7 x weekly - 3 sets - 10 reps - Standing Hip Abduction with Unilateral Counter Support  - 1 x daily - 5 x weekly - 2 sets - 5 reps - Heel Raises with Counter Support  - 1 x daily - 5-7 x weekly - 3 sets - 10 reps - Supine Bridge with Mini Swiss Ball Between Knees  - 1 x daily - 5 x weekly - 2-3 sets - 10 reps  GOALS: Goals reviewed with patient? Yes  SHORT TERM GOALS: Target date: 12/26/23 (assessed 12/25/23)  Pt will  be independent with initial HEP in order to improve strength and balance in order to decrease fall risk and improve function at home for ADL's and at work.  Baseline: Goal status: MET (reviewed and discussed with pt)  2.  Assess (to demonstrate improvement in cardiopulmonary endurance and community ambulation).  Baseline: 671 feet 12/01/23 Goal status: MET (assessed 12/01/23)  3.  Assess Functional Gait Assessment (to reduce fall risk and improve dynamic gait safety with community ambulation). Baseline: 11/30 (12/01/23) Goal status: MET (assessed 12/01/23)  4.  Pt will demonstrate improved R foot clearance for safety with gait.  Baseline:  Goal status: MET (pt reports improvement; pt's wife and friend also commenting to pt regarding improvement; improvement also noted via observation in clinic)    LONG TERM GOALS: Target date: 01/23/24  Pt will decrease 5 Time Sit to Stand by at least 3 seconds (and quality of movement) in order to demonstrate clinically significant improvement in LE strength.  Baseline: 14.19 seconds (decreased control/quality of movement R LE 3rd-5th stand; use of L UE) 11/24/23 Goal status: INITIAL  2.  Pt will be independent with final HEP in order to improve strength and balance in order to decrease fall risk and improve function at home for ADL's and at work.  Baseline:  Goal status: INITIAL  3.  Patient will be independent in bending down towards floor and picking up small object (<5 pounds) and then stand back up without loss of balance as to improve ability to pick up and clean up room at home.  Baseline:  Goal status: INITIAL  4.  Pt will increase by at least 31m (120ft) in order to demonstrate clinically significant improvement in cardiopulmonary endurance and community ambulation.  Baseline: 671 feet 12/01/23 Goal status: INITIAL  5.  Patient will increase Functional Gait Assessment score to >20/30 as to reduce fall risk and improve dynamic  gait safety with community ambulation.  Baseline: 11/30 (12/01/23) Goal status: INITIAL   ASSESSMENT:  CLINICAL IMPRESSION: Patient was seen today for physical therapy treatment to address transfers and ambulation.  Focused session on improving R LE foot clearance and step length; also focused on improving R LE WB'ing/activation during transfers.  Patient continues to be limited by R LE strength and R foot clearance.  They  demonstrate improvement in R LE foot clearance with ambulation after activity focusing on mechanics with clearing hurdles with R LE.  They would continue to benefit from skilled PT to address impairments as noted and progress towards long term goals.  OBJECTIVE IMPAIRMENTS: Abnormal gait, decreased activity tolerance, decreased balance, decreased cognition, decreased coordination, decreased endurance, decreased knowledge of condition, decreased knowledge of use of DME, decreased mobility, difficulty walking, decreased strength, impaired tone, and impaired UE functional use.   ACTIVITY LIMITATIONS: carrying, lifting, bending, sitting, standing, squatting, stairs, transfers, continence, bathing, toileting, dressing, reach over head, locomotion level, and caring for others  PARTICIPATION LIMITATIONS: meal prep, cleaning, laundry, driving, shopping, community activity, occupation, and yard work  PERSONAL FACTORS: Age, Past/current experiences, Time since onset of injury/illness/exacerbation, and 3+ comorbidities: PMH including Left temporal glioblastoma, SDH L, spastic hemiplegia, seizures, visual cut R side are also affecting patient's functional outcome.   REHAB POTENTIAL: Good  CLINICAL DECISION MAKING: Evolving/moderate complexity  EVALUATION COMPLEXITY: Moderate  PLAN:  PT FREQUENCY: 2x/week  PT DURATION: 8 weeks  PLANNED INTERVENTIONS: [id number]- PT Re-evaluation, 97750- Physical Performance Testing, 97110-Therapeutic exercises, 97530- Therapeutic activity, [id number]-  Neuromuscular re-education, 97535- Self Care, [id number]- Manual therapy, [id number]- Gait training, [phone]- Orthotic Initial, [phone]- Orthotic/Prosthetic subsequent, [phone]- Aquatic Therapy, [phone]- Electrical stimulation (manual), [phone]- Ultrasound, Patient/Family education, Balance training, Stair training, Joint mobilization, DME instructions, Cryotherapy, and Moist heat  PLAN FOR NEXT SESSION: Pt to bring R LE brace to assess next session (hasn't worn last 5 years but has questions about if he should use it/would it be beneficial); trial elliptical (pt request); Check on HEP; LE strengthening; SCI-FIT for strengthening and reciprocal movement; SLS activities; balance; increase R LE step length and foot clearance; leg press?; tall kneeling?   Damien Caulk, PT 01/05/2024, 1:46 PM

## 2024-01-08 ENCOUNTER — Ambulatory Visit: Admitting: Physical Therapy

## 2024-01-08 ENCOUNTER — Encounter: Payer: Self-pay | Admitting: Physical Therapy

## 2024-01-08 VITALS — BP 127/79 | HR 82

## 2024-01-08 DIAGNOSIS — R260 Ataxic gait: Secondary | ICD-10-CM

## 2024-01-08 DIAGNOSIS — C719 Malignant neoplasm of brain, unspecified: Secondary | ICD-10-CM

## 2024-01-08 DIAGNOSIS — R262 Difficulty in walking, not elsewhere classified: Secondary | ICD-10-CM

## 2024-01-08 DIAGNOSIS — R29818 Other symptoms and signs involving the nervous system: Secondary | ICD-10-CM

## 2024-01-08 NOTE — Therapy (Signed)
 OUTPATIENT PHYSICAL THERAPY NEURO TREATMENT  Patient Name: [name] MRN: [id number] DOB:25-Jul-1960, 63 y.o., male Today's Date: 01/08/2024   PCP: Dwight Trula SQUIBB, MD REFERRING PROVIDER: Camalier, Curry Scarce, NP  END OF SESSION:  PT End of Session - 01/08/24 1452     Visit Number 13    Number of Visits 17   16 plus Eval   Date for Recertification  01/23/24    Authorization Type [employer]    Authorization Time Period 12/01/23 - 01/26/24    Authorization - Visit Number 13    Authorization - Number of Visits 17    Progress Note Due on Visit 10    PT Start Time 1450   Therapist running late   PT Stop Time 1533    PT Time Calculation (min) 43 min    Equipment Utilized During Treatment Gait belt    Activity Tolerance Patient tolerated treatment well    Behavior During Therapy WFL for tasks assessed/performed             Past Medical History:  Diagnosis Date   ALLERGIC RHINITIS 01/09/2009   Glioblastoma multiforme (HCC)    stage IV   HYPERTENSION 01/09/2009   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   Left Temple   SEIZURE DISORDER 01/09/2009   Thyroid  disease    Hypothyroidism   Past Surgical History:  Procedure Laterality Date   CRANIOTOMY  2009   glioblastoma   Patient Active Problem List   Diagnosis Date Noted   Hypothyroidism 04/28/2015   GBM (glioblastoma multiforme) (HCC) 01/03/2011   Essential hypertension 01/09/2009   Allergic rhinitis 01/09/2009   SEIZURE DISORDER 01/09/2009    ONSET DATE: 2010 Stage 4 brain CA per pt report  REFERRING DIAG: C71.2 (ICD-10-CM) - Malignant neoplasm of temporal lobe  THERAPY DIAG:  Other symptoms and signs involving the nervous system  Difficulty in walking, not elsewhere classified  GBM (glioblastoma multiforme) (HCC)  Ataxic gait  Rationale for Evaluation and Treatment: Rehabilitation  SUBJECTIVE:                                                                                                                                                                                              SUBJECTIVE STATEMENT: Had urologist appointment; system (for medical records) was down so has to go back.  No acute changes.  No recent falls; no pain.  Pt did not bring R LE brace to assess. 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  PMH also includes: Spastic hemiplegia affecting right dominant side (CMS/HHS-HCC), sub-acute Cerebral infarct (CMS-HCC), seizures, htn, anemia, asthma, hypopotassemia.  Pt also reports having visual cut R side.  PAIN:  Are you having pain? Yes: NPRS scale: 0/10 currently Pain location: R UE and B LE's Pain description: stiff; tight Aggravating factors: not exercising or exercising too much Relieving factors: movement (not too much or too little)  PRECAUTIONS: Fall; R field visual cut  RED FLAGS: None   WEIGHT BEARING RESTRICTIONS: No  FALLS: Has patient fallen in last 6 months? No  LIVING ENVIRONMENT: Lives with: lives with their spouse Lives in: House/apartment Stairs: No Has following equipment at home: Single point cane, Quad cane small base, Walker - 2 wheeled, Crutches, shower chair, bed side commode, and Grab bars  PLOF: Independent with gait and Independent with transfers; assist putting on the belt, coat, and tie (extra time/effort to perform on own)  PATIENT GOALS: 5 years from now I still want to be walking.  Improve walking pattern and balance.  Don't want to regress; want to get better.  OBJECTIVE:  Note: Objective measures were completed at Evaluation unless otherwise noted.  DIAGNOSTIC FINDINGS: 11/11/23 MRI Brain with and without contrast: IMPRESSION: Stable postoperative changes within the  left temporal lobe. Compared to 07/29/2023, similar enhancement along the resection margin and inferomedial left temporal lobe. No evidence of local recurrence or metastatic disease within the brain. BT-RADS 2a.   COGNITION: Overall cognitive status: Within functional limits for tasks assessed   SENSATION: Not tested (pt reports no concerns)  COORDINATION: Impaired R LE heel to shin in sitting  MUSCLE TONE: RLE: Hypertonic  POSTURE: rounded shoulders and forward head  LOWER EXTREMITY ROM:     Active  Right Eval Left Eval  Hip flexion [facility]  Hip extension    Hip abduction [facility]  Hip adduction    Hip internal rotation    Hip external rotation    Knee flexion [facility] WFL  Knee extension [facility] WFL  Ankle dorsiflexion WFL (*PROM) WFL  Ankle plantarflexion    Ankle inversion    Ankle eversion     (Blank rows = not tested)  LOWER EXTREMITY MMT:    MMT Right Eval Left Eval  Hip flexion 4+/5 5/5  Hip extension    Hip abduction    Hip adduction    Hip internal rotation    Hip external rotation    Knee flexion 4-/5 5/5  Knee extension 4-/5 5/5  Ankle dorsiflexion 2+/5 5/5  Ankle plantarflexion 2+/5 >3/5  Ankle inversion    Ankle eversion    (Blank rows = not tested)  BED MOBILITY:  Not tested  Pt reports being independent at home  TRANSFERS:  Requires L UE support Sit to stand: SBA  Assistive device utilized: None     Stand to sit: SBA  Assistive device utilized: None     Chair to chair: SBA  Assistive device utilized: None       STAIRS: Not tested.  Pt reports able to do stairs with use of railing. GAIT: Findings: Gait Characteristics: step through pattern, decreased arm swing- Right, decreased arm swing- Left, decreased step length- Right, decreased step length- Left, decreased stance time- Right, decreased stride length, decreased hip/knee flexion- Right, decreased ankle dorsiflexion- Right, circumduction- Right, Right foot flat, ataxic, decreased trunk  rotation, and poor foot clearance- Right, Distance walked: Clinic distances,  Assistive device utilized:None, Level of assistance: SBA, and Comments: R LE externally rotated; R UE flexed and at side; increased L lateral weight shift  FUNCTIONAL TESTS:  5 times sit to stand: 14.19 seconds (decreased control/quality of movement R LE 3rd-5th stand); use of L UE; on Eval Timed up and go (TUG): 12.35 seconds (did trial run first); on Eval 6 minute walk test: 671 feet (decreased R foot clearance and gait speed with increased distance ambulating) 12/01/23 Functional gait assessment: 11/30 (12/01/23)  PATIENT SURVEYS:  ABC scale: 70.31% 12/15/23 The Activities-Specific Balance Confidence (ABC) Scale 0% 10 20 30  40 50 60 70 80 90 100% No confidence<->completely confident  "How confident are you that you will not lose your balance or become unsteady when you . . .   Date tested 12/15/23  Walk around the house 75%  2. Walk up or down stairs 70% with use of railing  3. Bend over and pick up a slipper from in front of a closet floor 50%  4. Reach for a small can off a shelf at eye level 100%  5. Stand on tip toes and reach for something above your head 50%  6. Stand on a chair and reach for something 50%  7. Sweep the floor 100%  8. Walk outside the house to a car parked in the driveway 100%  9. Get into or out of a car 30% (getting into car no problem; difficulties getting out of car)  10. Walk across a parking lot to the mall 80%  11. Walk up or down a ramp 80%  12. Walk in a crowded mall where people rapidly walk past you 50%  13. Are bumped into by people as you walk through the mall 70%  14. Step onto or off of an escalator while you are holding onto the railing 100%  15. Step onto or off an escalator while holding onto parcels such that you cannot hold onto the railing 70%  16. Walk outside on icy sidewalks 50%  Total: 1125/16 70.31%                                                                                                                                  TREATMENT DATE: 01/08/24  Self Care: BP and HR taken in sitting at rest beginning of session (see below for details). Vitals:   01/08/24 1455  BP: 127/79  Pulse: 82   Therapeutic Exercise: Elliptical (for cardiovascular endurance): x4 minutes total at level 4 (x1 minute forward, x1 minute retro; x1 minute forward; x1 minute retro); RPE 9/10 with HR 100 bpm post activity.  Extra time required to get on/off elliptical and for positioning.  Therapeutic Activity: Standing balance on mini trampoline (pt hitting birdie with badminton raquet in L UE); wider BOS with mild B hip and knee flexion noted during activity per pt's preference; CGA for safety; x5 minutes Notes: no loss of balance noted; extra  difficulty with hitting birdie d/t R vision cut. Sit to stands from mat table with 6 inch step under L LE (to promote R LE WB'ing): x10 reps x3 sets (vc's for shifting weight forward d/t posterior lean tendency); no UE support; SBA Ambulation x230 feet (no UE support) with fluctuating perturbation/resistance via [place] black resistance band around pelvis; SBA   PATIENT EDUCATION:  Education details:  Continue HEP. Person educated: Patient Education method: Explanation and Verbal cues Education comprehension: verbalized understanding  HOME EXERCISE PROGRAM: Access Code: [email or web address] URL: [email or web address] Date: 12/22/2023 Prepared by: Damien Caulk  Exercises - Standing March with Counter Support  - 1 x daily - 5-7 x weekly - 3 sets - 10 reps - Standing Hip Abduction with Unilateral Counter Support  - 1 x daily - 5 x weekly - 2 sets - 5 reps - Heel Raises with Counter Support  - 1 x daily - 5-7 x weekly - 3 sets - 10 reps - Supine Bridge with Mini Swiss Ball Between Knees  - 1 x daily - 5 x weekly - 2-3 sets - 10 reps  GOALS: Goals reviewed with patient? Yes  SHORT TERM GOALS: Target date: 12/26/23  (assessed 12/25/23)  Pt will be independent with initial HEP in order to improve strength and balance in order to decrease fall risk and improve function at home for ADL's and at work.  Baseline: Goal status: MET (reviewed and discussed with pt)  2.  Assess (to demonstrate improvement in cardiopulmonary endurance and community ambulation).  Baseline: 671 feet 12/01/23 Goal status: MET (assessed 12/01/23)  3.  Assess Functional Gait Assessment (to reduce fall risk and improve dynamic gait safety with community ambulation). Baseline: 11/30 (12/01/23) Goal status: MET (assessed 12/01/23)  4.  Pt will demonstrate improved R foot clearance for safety with gait.  Baseline:  Goal status: MET (pt reports improvement; pt's wife and friend also commenting to pt regarding improvement; improvement also noted via observation in clinic)    LONG TERM GOALS: Target date: 01/23/24  Pt will decrease 5 Time Sit to Stand by at least 3 seconds (and quality of movement) in order to demonstrate clinically significant improvement in LE strength.  Baseline: 14.19 seconds (decreased control/quality of movement R LE 3rd-5th stand; use of L UE) 11/24/23 Goal status: INITIAL  2.  Pt will be independent with final HEP in order to improve strength and balance in order to decrease fall risk and improve function at home for ADL's and at work.  Baseline:  Goal status: INITIAL  3.  Patient will be independent in bending down towards floor and picking up small object (<5 pounds) and then stand back up without loss of balance as to improve ability to pick up and clean up room at home.  Baseline:  Goal status: INITIAL  4.  Pt will increase by at least 59m (119ft) in order to demonstrate clinically significant improvement in cardiopulmonary endurance and community ambulation.  Baseline: 671 feet 12/01/23 Goal status: INITIAL  5.  Patient will increase Functional Gait Assessment score to >20/30 as to reduce  fall risk and improve dynamic gait safety with community ambulation.  Baseline: 11/30 (12/01/23) Goal status: INITIAL   ASSESSMENT:  CLINICAL IMPRESSION: Patient was seen today for physical therapy treatment to address endurance, balance, and transfers.  Focused session on cardiovascular endurance, dynamic balance activities (on compliant surface; with ambulation), and increasing R LE WB'ing with transfers.  Patient continues to be limited by R LE strength and R  foot clearance.  They demonstrate improvement in balance with sessions activities.  They would continue to benefit from skilled PT to address impairments as noted and progress towards long term goals.  OBJECTIVE IMPAIRMENTS: Abnormal gait, decreased activity tolerance, decreased balance, decreased cognition, decreased coordination, decreased endurance, decreased knowledge of condition, decreased knowledge of use of DME, decreased mobility, difficulty walking, decreased strength, impaired tone, and impaired UE functional use.   ACTIVITY LIMITATIONS: carrying, lifting, bending, sitting, standing, squatting, stairs, transfers, continence, bathing, toileting, dressing, reach over head, locomotion level, and caring for others  PARTICIPATION LIMITATIONS: meal prep, cleaning, laundry, driving, shopping, community activity, occupation, and yard work  PERSONAL FACTORS: Age, Past/current experiences, Time since onset of injury/illness/exacerbation, and 3+ comorbidities: PMH including Left temporal glioblastoma, SDH L, spastic hemiplegia, seizures, visual cut R side are also affecting patient's functional outcome.   REHAB POTENTIAL: Good  CLINICAL DECISION MAKING: Evolving/moderate complexity  EVALUATION COMPLEXITY: Moderate  PLAN:  PT FREQUENCY: 2x/week  PT DURATION: 8 weeks  PLANNED INTERVENTIONS: [id number]- PT Re-evaluation, 97750- Physical Performance Testing, 97110-Therapeutic exercises, 97530- Therapeutic activity, [id number]- Neuromuscular  re-education, 97535- Self Care, [id number]- Manual therapy, [id number]- Gait training, [id number]- Orthotic Initial, [id number]- Orthotic/Prosthetic subsequent, [phone]- Aquatic Therapy, [phone]- Electrical stimulation (manual), [phone]- Ultrasound, Patient/Family education, Balance training, Stair training, Joint mobilization, DME instructions, Cryotherapy, and Moist heat  PLAN FOR NEXT SESSION: Check on HEP; LE strengthening; SCI-FIT for strengthening and reciprocal movement; SLS activities; balance; increase R LE step length and foot clearance; leg press?; tall kneeling?   Damien Caulk, PT 01/09/2024, 12:27 PM

## 2024-01-12 ENCOUNTER — Ambulatory Visit: Admitting: Physical Therapy

## 2024-01-12 ENCOUNTER — Encounter: Payer: Self-pay | Admitting: Physical Therapy

## 2024-01-12 VITALS — BP 127/86 | HR 73

## 2024-01-12 DIAGNOSIS — R29818 Other symptoms and signs involving the nervous system: Secondary | ICD-10-CM

## 2024-01-12 DIAGNOSIS — R260 Ataxic gait: Secondary | ICD-10-CM

## 2024-01-12 DIAGNOSIS — R262 Difficulty in walking, not elsewhere classified: Secondary | ICD-10-CM

## 2024-01-12 DIAGNOSIS — C719 Malignant neoplasm of brain, unspecified: Secondary | ICD-10-CM

## 2024-01-12 NOTE — Therapy (Signed)
 OUTPATIENT PHYSICAL THERAPY NEURO TREATMENT  Patient Name: [name] MRN: [id number] DOB:1960-12-27, 63 y.o., male Today's Date: 01/12/2024   PCP: Dwight Trula SQUIBB, MD REFERRING PROVIDER: Camalier, Curry Scarce, NP  END OF SESSION:  PT End of Session - 01/12/24 1407     Visit Number 14    Number of Visits 17   16 plus Eval   Date for Recertification  01/23/24    Authorization Type [employer]    Authorization Time Period 12/01/23 - 01/26/24    Authorization - Visit Number 14    Authorization - Number of Visits 17    Progress Note Due on Visit 10    PT Start Time 1405    PT Stop Time 1455    PT Time Calculation (min) 50 min    Equipment Utilized During Treatment Gait belt    Activity Tolerance Patient tolerated treatment well    Behavior During Therapy WFL for tasks assessed/performed             Past Medical History:  Diagnosis Date   ALLERGIC RHINITIS 01/09/2009   Glioblastoma multiforme (HCC)    stage IV   HYPERTENSION 01/09/2009   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   Left Temple   SEIZURE DISORDER 01/09/2009   Thyroid  disease    Hypothyroidism   Past Surgical History:  Procedure Laterality Date   CRANIOTOMY  2009   glioblastoma   Patient Active Problem List   Diagnosis Date Noted   Hypothyroidism 04/28/2015   GBM (glioblastoma multiforme) (HCC) 01/03/2011   Essential hypertension 01/09/2009   Allergic rhinitis 01/09/2009   SEIZURE DISORDER 01/09/2009    ONSET DATE: 2010 Stage 4 brain CA per pt report  REFERRING DIAG: C71.2 (ICD-10-CM) - Malignant neoplasm of temporal lobe  THERAPY DIAG:  Other symptoms and signs involving the nervous system  Difficulty in walking, not elsewhere classified  GBM (glioblastoma multiforme) (HCC)  Ataxic gait  Rationale for Evaluation and Treatment: Rehabilitation  SUBJECTIVE:                                                                                                                                                                                              SUBJECTIVE STATEMENT: No recent falls.  Did a lot of walking yesterday.  No acute changes. 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  PMH also includes: Spastic hemiplegia affecting right dominant  side (CMS/HHS-HCC), sub-acute Cerebral infarct (CMS-HCC), seizures, htn, anemia, asthma, hypopotassemia.  Pt also reports having visual cut R side.  PAIN:  Are you having pain? Yes: NPRS scale: 0/10 currently Pain location: R UE and B LE's Pain description: stiff; tight Aggravating factors: not exercising or exercising too much Relieving factors: movement (not too much or too little)  PRECAUTIONS: Fall; R field visual cut  RED FLAGS: None   WEIGHT BEARING RESTRICTIONS: No  FALLS: Has patient fallen in last 6 months? No  LIVING ENVIRONMENT: Lives with: lives with their spouse Lives in: House/apartment Stairs: No Has following equipment at home: Single point cane, Quad cane small base, Walker - 2 wheeled, Crutches, shower chair, bed side commode, and Grab bars  PLOF: Independent with gait and Independent with transfers; assist putting on the belt, coat, and tie (extra time/effort to perform on own)  PATIENT GOALS: 5 years from now I still want to be walking.  Improve walking pattern and balance.  Don't want to regress; want to get better.  OBJECTIVE:  Note: Objective measures were completed at Evaluation unless otherwise noted.  DIAGNOSTIC FINDINGS: 11/11/23 MRI Brain with and without contrast: IMPRESSION: Stable postoperative changes within the left temporal lobe. Compared to 07/29/2023, similar enhancement along the resection margin and inferomedial left temporal  lobe. No evidence of local recurrence or metastatic disease within the brain. BT-RADS 2a.   COGNITION: Overall cognitive status: Within functional limits for tasks assessed   SENSATION: Not tested (pt reports no concerns)  COORDINATION: Impaired R LE heel to shin in sitting  MUSCLE TONE: RLE: Hypertonic  POSTURE: rounded shoulders and forward head  LOWER EXTREMITY ROM:     Active  Right Eval Left Eval  Hip flexion [facility]  Hip extension    Hip abduction [facility]  Hip adduction    Hip internal rotation    Hip external rotation    Knee flexion [facility] WFL  Knee extension [facility] WFL  Ankle dorsiflexion WFL (*PROM) WFL  Ankle plantarflexion    Ankle inversion    Ankle eversion     (Blank rows = not tested)  LOWER EXTREMITY MMT:    MMT Right Eval Left Eval  Hip flexion 4+/5 5/5  Hip extension    Hip abduction    Hip adduction    Hip internal rotation    Hip external rotation    Knee flexion 4-/5 5/5  Knee extension 4-/5 5/5  Ankle dorsiflexion 2+/5 5/5  Ankle plantarflexion 2+/5 >3/5  Ankle inversion    Ankle eversion    (Blank rows = not tested)  BED MOBILITY:  Not tested  Pt reports being independent at home  TRANSFERS:  Requires L UE support Sit to stand: SBA  Assistive device utilized: None     Stand to sit: SBA  Assistive device utilized: None     Chair to chair: SBA  Assistive device utilized: None       STAIRS: Not tested.  Pt reports able to do stairs with use of railing. GAIT: Findings: Gait Characteristics: step through pattern, decreased arm swing- Right, decreased arm swing- Left, decreased step length- Right, decreased step length- Left, decreased stance time- Right, decreased stride length, decreased hip/knee flexion- Right, decreased ankle dorsiflexion- Right, circumduction- Right, Right foot flat, ataxic, decreased trunk rotation, and poor foot clearance- Right, Distance walked: Clinic distances, Assistive device utilized:None, Level of assistance:  SBA, and Comments: R LE externally rotated; R UE flexed and at side; increased L lateral weight shift  FUNCTIONAL TESTS:  5 times sit to stand: 14.19 seconds (decreased control/quality of movement R LE 3rd-5th stand); use of L UE; on Eval Timed up and go (TUG): 12.35 seconds (did trial run first); on Eval 6 minute walk test: 671 feet (decreased R foot clearance and gait speed with increased distance ambulating) 12/01/23 Functional gait assessment: 11/30 (12/01/23)  PATIENT SURVEYS:  ABC scale: 70.31% 12/15/23 The Activities-Specific Balance Confidence (ABC) Scale 0% 10 20 30  40 50 60 70 80 90 100% No confidence<->completely confident  "How confident are you that you will not lose your balance or become unsteady when you . . .   Date tested 12/15/23  Walk around the house 75%  2. Walk up or down stairs 70% with use of railing  3. Bend over and pick up a slipper from in front of a closet floor 50%  4. Reach for a small can off a shelf at eye level 100%  5. Stand on tip toes and reach for something above your head 50%  6. Stand on a chair and reach for something 50%  7. Sweep the floor 100%  8. Walk outside the house to a car parked in the driveway 100%  9. Get into or out of a car 30% (getting into car no problem; difficulties getting out of car)  10. Walk across a parking lot to the mall 80%  11. Walk up or down a ramp 80%  12. Walk in a crowded mall where people rapidly walk past you 50%  13. Are bumped into by people as you walk through the mall 70%  14. Step onto or off of an escalator while you are holding onto the railing 100%  15. Step onto or off an escalator while holding onto parcels such that you cannot hold onto the railing 70%  16. Walk outside on icy sidewalks 50%  Total: 1125/16 70.31%                                                                                                                                 TREATMENT DATE: 01/12/24  Self Care: BP and HR taken  in sitting at rest beginning of session (see below for details). Vitals:   01/12/24 1410  BP: 127/86  Pulse: 73   Therapeutic Activity: Balance at ballet bar on 9 foot foam (purple) balance beam: Side stepping R/L (x4 trials each direction) on balance beam with L UE support (intermittently stopping to tap object on mirror with L UE; CGA for safety/balance) Heel toe walking on balance beam (L UE support on ballet bar): x4 trials (forwards) x2 sets; CGA; increased effort for R LE coordination/foot positioning Standing on Airex at steps: x10 reps x2 sets R LE toe taps to 6 inch step (CGA) x10 reps x2 sets L LE toe taps to 6 inch step (CGA to min assist 1st trial; CGA 2nd trial) Notes: improved balance with repetition; improved R LE  foot clearance with repetition  Therapeutic Exercise: SciFit multi-peaks up to level 9 for 8 minutes using BUE/BLEs for neural priming for reciprocal movement, dynamic cardiovascular endurance and increased amplitude of stepping. RPE of 5/10 and HR 96 bpm following activity.  Average stride length 11.1 inches.  PATIENT EDUCATION:  Education details:  Continue HEP.  Discussed remaining visits and scheduled last 2 PT appointments. Person educated: Patient Education method: Explanation and Verbal cues Education comprehension: verbalized understanding  HOME EXERCISE PROGRAM: Access Code: [email or web address] URL: [email or web address] Date: 12/22/2023 Prepared by: Damien Caulk  Exercises - Standing March with Counter Support  - 1 x daily - 5-7 x weekly - 3 sets - 10 reps - Standing Hip Abduction with Unilateral Counter Support  - 1 x daily - 5 x weekly - 2 sets - 5 reps - Heel Raises with Counter Support  - 1 x daily - 5-7 x weekly - 3 sets - 10 reps - Supine Bridge with Mini Swiss Ball Between Knees  - 1 x daily - 5 x weekly - 2-3 sets - 10 reps  GOALS: Goals reviewed with patient? Yes  SHORT TERM GOALS: Target date: 12/26/23 (assessed 12/25/23)  Pt  will be independent with initial HEP in order to improve strength and balance in order to decrease fall risk and improve function at home for ADL's and at work.  Baseline: Goal status: MET (reviewed and discussed with pt)  2.  Assess (to demonstrate improvement in cardiopulmonary endurance and community ambulation).  Baseline: 671 feet 12/01/23 Goal status: MET (assessed 12/01/23)  3.  Assess Functional Gait Assessment (to reduce fall risk and improve dynamic gait safety with community ambulation). Baseline: 11/30 (12/01/23) Goal status: MET (assessed 12/01/23)  4.  Pt will demonstrate improved R foot clearance for safety with gait.  Baseline:  Goal status: MET (pt reports improvement; pt's wife and friend also commenting to pt regarding improvement; improvement also noted via observation in clinic)    LONG TERM GOALS: Target date: 01/23/24  Pt will decrease 5 Time Sit to Stand by at least 3 seconds (and quality of movement) in order to demonstrate clinically significant improvement in LE strength.  Baseline: 14.19 seconds (decreased control/quality of movement R LE 3rd-5th stand; use of L UE) 11/24/23 Goal status: INITIAL  2.  Pt will be independent with final HEP in order to improve strength and balance in order to decrease fall risk and improve function at home for ADL's and at work.  Baseline:  Goal status: INITIAL  3.  Patient will be independent in bending down towards floor and picking up small object (<5 pounds) and then stand back up without loss of balance as to improve ability to pick up and clean up room at home.  Baseline:  Goal status: INITIAL  4.  Pt will increase by at least 62m (123ft) in order to demonstrate clinically significant improvement in cardiopulmonary endurance and community ambulation.  Baseline: 671 feet 12/01/23 Goal status: INITIAL  5.  Patient will increase Functional Gait Assessment score to >20/30 as to reduce fall risk and improve  dynamic gait safety with community ambulation.  Baseline: 11/30 (12/01/23) Goal status: INITIAL   ASSESSMENT:  CLINICAL IMPRESSION: Patient was seen today for physical therapy treatment to address strength and balance.  Focused session on improving balance (on compliant surfaces with dynamic activities) and LE strengthening.  Patient continues to be limited by R LE strength and R foot clearance.   Improved dynamic balance noted with repetition on  compliant surfaces. They would continue to benefit from skilled PT to address impairments as noted and progress towards long term goals.  OBJECTIVE IMPAIRMENTS: Abnormal gait, decreased activity tolerance, decreased balance, decreased cognition, decreased coordination, decreased endurance, decreased knowledge of condition, decreased knowledge of use of DME, decreased mobility, difficulty walking, decreased strength, impaired tone, and impaired UE functional use.   ACTIVITY LIMITATIONS: carrying, lifting, bending, sitting, standing, squatting, stairs, transfers, continence, bathing, toileting, dressing, reach over head, locomotion level, and caring for others  PARTICIPATION LIMITATIONS: meal prep, cleaning, laundry, driving, shopping, community activity, occupation, and yard work  PERSONAL FACTORS: Age, Past/current experiences, Time since onset of injury/illness/exacerbation, and 3+ comorbidities: PMH including Left temporal glioblastoma, SDH L, spastic hemiplegia, seizures, visual cut R side are also affecting patient's functional outcome.   REHAB POTENTIAL: Good  CLINICAL DECISION MAKING: Evolving/moderate complexity  EVALUATION COMPLEXITY: Moderate  PLAN:  PT FREQUENCY: 2x/week  PT DURATION: 8 weeks  PLANNED INTERVENTIONS: [id number]- PT Re-evaluation, 97750- Physical Performance Testing, 97110-Therapeutic exercises, 97530- Therapeutic activity, [id number]- Neuromuscular re-education, 97535- Self Care, [id number]- Manual therapy, [id number]- Gait training,  [id number]- Orthotic Initial, [id number]- Orthotic/Prosthetic subsequent, [id number]- Aquatic Therapy, [phone]- Electrical stimulation (manual), [phone]- Ultrasound, Patient/Family education, Balance training, Stair training, Joint mobilization, DME instructions, Cryotherapy, and Moist heat  PLAN FOR NEXT SESSION: Check on HEP; LE strengthening; SCI-FIT for strengthening and reciprocal movement; SLS activities; balance; increase R LE step length and foot clearance; leg press?; tall kneeling?   Damien Caulk, PT 01/12/2024, 3:26 PM

## 2024-01-14 ENCOUNTER — Ambulatory Visit: Admitting: Physical Therapy

## 2024-01-14 ENCOUNTER — Encounter: Payer: Self-pay | Admitting: Physical Therapy

## 2024-01-14 VITALS — BP 130/87 | HR 75

## 2024-01-14 DIAGNOSIS — R260 Ataxic gait: Secondary | ICD-10-CM

## 2024-01-14 DIAGNOSIS — R29818 Other symptoms and signs involving the nervous system: Secondary | ICD-10-CM

## 2024-01-14 DIAGNOSIS — C719 Malignant neoplasm of brain, unspecified: Secondary | ICD-10-CM

## 2024-01-14 DIAGNOSIS — R262 Difficulty in walking, not elsewhere classified: Secondary | ICD-10-CM

## 2024-01-14 NOTE — Therapy (Signed)
 OUTPATIENT PHYSICAL THERAPY NEURO TREATMENT  Patient Name: [name] MRN: [id number] DOB:1961/01/19, 63 y.o., male Today's Date: 01/14/2024   PCP: Dwight Trula SQUIBB, MD REFERRING PROVIDER: Camalier, Curry Scarce, NP  END OF SESSION:  PT End of Session - 01/14/24 1407     Visit Number 15    Number of Visits 17   16 plus Eval   Date for Recertification  01/23/24    Authorization Type [employer]    Authorization Time Period 12/01/23 - 01/26/24    Authorization - Visit Number 15    Authorization - Number of Visits 17    Progress Note Due on Visit 10    PT Start Time 1405   Therapist running late   PT Stop Time 1447    PT Time Calculation (min) 42 min    Equipment Utilized During Treatment Gait belt    Activity Tolerance Patient tolerated treatment well    Behavior During Therapy WFL for tasks assessed/performed             Past Medical History:  Diagnosis Date   ALLERGIC RHINITIS 01/09/2009   Glioblastoma multiforme (HCC)    stage IV   HYPERTENSION 01/09/2009   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   Left Temple   SEIZURE DISORDER 01/09/2009   Thyroid  disease    Hypothyroidism   Past Surgical History:  Procedure Laterality Date   CRANIOTOMY  2009   glioblastoma   Patient Active Problem List   Diagnosis Date Noted   Hypothyroidism 04/28/2015   GBM (glioblastoma multiforme) (HCC) 01/03/2011   Essential hypertension 01/09/2009   Allergic rhinitis 01/09/2009   SEIZURE DISORDER 01/09/2009    ONSET DATE: 2010 Stage 4 brain CA per pt report  REFERRING DIAG: C71.2 (ICD-10-CM) - Malignant neoplasm of temporal lobe  THERAPY DIAG:  Other symptoms and signs involving the nervous system  Difficulty in walking, not elsewhere classified  GBM (glioblastoma multiforme) (HCC)  Ataxic gait  Rationale for Evaluation and Treatment: Rehabilitation  SUBJECTIVE:                                                                                                                                                                                              SUBJECTIVE STATEMENT: Pt arrived and brought BlueRocker R LE brace; has not worn in 11 years. No recent falls.  No acute changes. 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  PMH also includes: Spastic hemiplegia affecting right dominant side (CMS/HHS-HCC), sub-acute Cerebral infarct (CMS-HCC), seizures, htn, anemia, asthma, hypopotassemia.  Pt also reports having visual cut R side.  PAIN:  Are you having pain? Yes: NPRS scale: 0/10 currently Pain location: R UE and B LE's Pain description: stiff; tight Aggravating factors: not exercising or exercising too much Relieving factors: movement (not too much or too little)  PRECAUTIONS: Fall; R field visual cut  RED FLAGS: None   WEIGHT BEARING RESTRICTIONS: No  FALLS: Has patient fallen in last 6 months? No  LIVING ENVIRONMENT: Lives with: lives with their spouse Lives in: House/apartment Stairs: No Has following equipment at home: Single point cane, Quad cane small base, Walker - 2 wheeled, Crutches, shower chair, bed side commode, and Grab bars  PLOF: Independent with gait and Independent with transfers; assist putting on the belt, coat, and tie (extra time/effort to perform on own)  PATIENT GOALS: 5 years from now I still want to be walking.  Improve walking pattern and balance.  Don't want to regress; want to get better.  OBJECTIVE:  Note: Objective measures were completed at Evaluation unless otherwise noted.  DIAGNOSTIC FINDINGS: 11/11/23 MRI Brain with and without contrast: IMPRESSION: Stable postoperative changes within the left temporal lobe. Compared to 07/29/2023, similar enhancement  along the resection margin and inferomedial left temporal lobe. No evidence of local recurrence or metastatic disease within the brain. BT-RADS 2a.   COGNITION: Overall cognitive status: Within functional limits for tasks assessed   SENSATION: Not tested (pt reports no concerns)  COORDINATION: Impaired R LE heel to shin in sitting  MUSCLE TONE: RLE: Hypertonic  POSTURE: rounded shoulders and forward head  LOWER EXTREMITY ROM:     Active  Right Eval Left Eval  Hip flexion [facility]  Hip extension    Hip abduction [facility]  Hip adduction    Hip internal rotation    Hip external rotation    Knee flexion [facility] WFL  Knee extension [facility] WFL  Ankle dorsiflexion WFL (*PROM) WFL  Ankle plantarflexion    Ankle inversion    Ankle eversion     (Blank rows = not tested)  LOWER EXTREMITY MMT:    MMT Right Eval Left Eval  Hip flexion 4+/5 5/5  Hip extension    Hip abduction    Hip adduction    Hip internal rotation    Hip external rotation    Knee flexion 4-/5 5/5  Knee extension 4-/5 5/5  Ankle dorsiflexion 2+/5 5/5  Ankle plantarflexion 2+/5 >3/5  Ankle inversion    Ankle eversion    (Blank rows = not tested)  BED MOBILITY:  Not tested  Pt reports being independent at home  TRANSFERS:  Requires L UE support Sit to stand: SBA  Assistive device utilized: None     Stand to sit: SBA  Assistive device utilized: None     Chair to chair: SBA  Assistive device utilized: None       STAIRS: Not tested.  Pt reports able to do stairs with use of railing. GAIT: Findings: Gait Characteristics: step through pattern, decreased arm swing- Right, decreased arm swing- Left, decreased step length- Right, decreased step length- Left, decreased stance time- Right, decreased stride length, decreased hip/knee flexion- Right, decreased ankle dorsiflexion- Right, circumduction- Right, Right foot flat, ataxic, decreased trunk rotation, and poor foot clearance- Right, Distance walked: Clinic  distances, Assistive device utilized:None, Level of assistance: SBA, and Comments: R LE externally rotated;  R UE flexed and at side; increased L lateral weight shift  FUNCTIONAL TESTS:  5 times sit to stand: 14.19 seconds (decreased control/quality of movement R LE 3rd-5th stand); use of L UE; on Eval Timed up and go (TUG): 12.35 seconds (did trial run first); on Eval 6 minute walk test: 671 feet (decreased R foot clearance and gait speed with increased distance ambulating) 12/01/23 Functional gait assessment: 11/30 (12/01/23)  PATIENT SURVEYS:  ABC scale: 70.31% 12/15/23 The Activities-Specific Balance Confidence (ABC) Scale 0% 10 20 30  40 50 60 70 80 90 100% No confidence<->completely confident  "How confident are you that you will not lose your balance or become unsteady when you . . .   Date tested 12/15/23  Walk around the house 75%  2. Walk up or down stairs 70% with use of railing  3. Bend over and pick up a slipper from in front of a closet floor 50%  4. Reach for a small can off a shelf at eye level 100%  5. Stand on tip toes and reach for something above your head 50%  6. Stand on a chair and reach for something 50%  7. Sweep the floor 100%  8. Walk outside the house to a car parked in the driveway 100%  9. Get into or out of a car 30% (getting into car no problem; difficulties getting out of car)  10. Walk across a parking lot to the mall 80%  11. Walk up or down a ramp 80%  12. Walk in a crowded mall where people rapidly walk past you 50%  13. Are bumped into by people as you walk through the mall 70%  14. Step onto or off of an escalator while you are holding onto the railing 100%  15. Step onto or off an escalator while holding onto parcels such that you cannot hold onto the railing 70%  16. Walk outside on icy sidewalks 50%  Total: 1125/16 70.31%                                                                                                                                  TREATMENT DATE: 01/14/24  Self Care: BP and HR taken in sitting at rest beginning of session (see below for details). Vitals:   01/14/24 1411  BP: 130/87  Pulse: 75   Therapeutic Exercise: SciFit multi-peaks up to level 9 for 8 minutes using BUE/BLEs for neural priming for reciprocal movement, dynamic cardiovascular warmup and increased amplitude of stepping. RPE of 6/10 with HR 92 bpm following activity.  Average stride length 10.6 inches.  Gait:  Trialed ambulation with R LE BlueRocker AFO: x115 feet; no AD use; SBA. Notes: Skin intact post BlueRocker AFO use.  Increased R knee flexion followed by R knee hyperextension noted during R LE stance phase compared to no AFO use. Trialed foot up brace (large): x115 feet no AD; SBA Notes: improved R foot  clearance noted compared to no AFO use but shoes not allowing for optimal brace set-up  PATIENT EDUCATION:  Education details:  BlueRocker AFO and foot up brace education.  Continue HEP.  Discussed visits remaining. Person educated: Patient Education method: Explanation and Verbal cues Education comprehension: verbalized understanding  HOME EXERCISE PROGRAM: Access Code: [email or web address] URL: [email or web address] Date: 12/22/2023 Prepared by: Damien Caulk  Exercises - Standing March with Counter Support  - 1 x daily - 5-7 x weekly - 3 sets - 10 reps - Standing Hip Abduction with Unilateral Counter Support  - 1 x daily - 5 x weekly - 2 sets - 5 reps - Heel Raises with Counter Support  - 1 x daily - 5-7 x weekly - 3 sets - 10 reps - Supine Bridge with Mini Swiss Ball Between Knees  - 1 x daily - 5 x weekly - 2-3 sets - 10 reps  GOALS: Goals reviewed with patient? Yes  SHORT TERM GOALS: Target date: 12/26/23 (assessed 12/25/23)  Pt will be independent with initial HEP in order to improve strength and balance in order to decrease fall risk and improve function at home for ADL's and at work.  Baseline: Goal status: MET  (reviewed and discussed with pt)  2.  Assess (to demonstrate improvement in cardiopulmonary endurance and community ambulation).  Baseline: 671 feet 12/01/23 Goal status: MET (assessed 12/01/23)  3.  Assess Functional Gait Assessment (to reduce fall risk and improve dynamic gait safety with community ambulation). Baseline: 11/30 (12/01/23) Goal status: MET (assessed 12/01/23)  4.  Pt will demonstrate improved R foot clearance for safety with gait.  Baseline:  Goal status: MET (pt reports improvement; pt's wife and friend also commenting to pt regarding improvement; improvement also noted via observation in clinic)    LONG TERM GOALS: Target date: 01/23/24  Pt will decrease 5 Time Sit to Stand by at least 3 seconds (and quality of movement) in order to demonstrate clinically significant improvement in LE strength.  Baseline: 14.19 seconds (decreased control/quality of movement R LE 3rd-5th stand; use of L UE) 11/24/23 Goal status: INITIAL  2.  Pt will be independent with final HEP in order to improve strength and balance in order to decrease fall risk and improve function at home for ADL's and at work.  Baseline:  Goal status: INITIAL  3.  Patient will be independent in bending down towards floor and picking up small object (<5 pounds) and then stand back up without loss of balance as to improve ability to pick up and clean up room at home.  Baseline:  Goal status: INITIAL  4.  Pt will increase by at least 38m (142ft) in order to demonstrate clinically significant improvement in cardiopulmonary endurance and community ambulation.  Baseline: 671 feet 12/01/23 Goal status: INITIAL  5.  Patient will increase Functional Gait Assessment score to >20/30 as to reduce fall risk and improve dynamic gait safety with community ambulation.  Baseline: 11/30 (12/01/23) Goal status: INITIAL   ASSESSMENT:  CLINICAL IMPRESSION: Patient was seen today for physical therapy treatment to  address ambulation.  Focused session on trialing pt's personal BlueRocker brace (hasn't used in about 11 years but pt wanted to know if he should start wearing it again) and foot up brace.  Pt noted with increased R knee flexion followed by R knee hyperextension in R LE stance phase when wearing BlueRocker brace.  R foot up brace (large) improved R ankle/foot stability and R foot clearance during ambulation (compared  to no brace use) but d/t tongue of shoe being connected to shoe, unable to place piece of foot up brace in optimal position.  Educated pt not to wear BlueRocker brace d/t decreased quality of gait mechanics with use.  Plan to trial medium foot up brace next session with different pair of pt's personal tennis shoes to further determine if this would be good option for pt.  Remaining therapy visits discussed with pt; pt appearing with appropriate understanding.  They would continue to benefit from skilled PT to address impairments as noted and progress towards long term goals.  OBJECTIVE IMPAIRMENTS: Abnormal gait, decreased activity tolerance, decreased balance, decreased cognition, decreased coordination, decreased endurance, decreased knowledge of condition, decreased knowledge of use of DME, decreased mobility, difficulty walking, decreased strength, impaired tone, and impaired UE functional use.   ACTIVITY LIMITATIONS: carrying, lifting, bending, sitting, standing, squatting, stairs, transfers, continence, bathing, toileting, dressing, reach over head, locomotion level, and caring for others  PARTICIPATION LIMITATIONS: meal prep, cleaning, laundry, driving, shopping, community activity, occupation, and yard work  PERSONAL FACTORS: Age, Past/current experiences, Time since onset of injury/illness/exacerbation, and 3+ comorbidities: PMH including Left temporal glioblastoma, SDH L, spastic hemiplegia, seizures, visual cut R side are also affecting patient's functional outcome.   REHAB  POTENTIAL: Good  CLINICAL DECISION MAKING: Evolving/moderate complexity  EVALUATION COMPLEXITY: Moderate  PLAN:  PT FREQUENCY: 2x/week  PT DURATION: 8 weeks  PLANNED INTERVENTIONS: [id number]- PT Re-evaluation, 97750- Physical Performance Testing, 97110-Therapeutic exercises, 97530- Therapeutic activity, [id number]- Neuromuscular re-education, 97535- Self Care, [id number]- Manual therapy, [id number]- Gait training, [phone]- Orthotic Initial, [phone]- Orthotic/Prosthetic subsequent, [phone]- Aquatic Therapy, [phone]- Electrical stimulation (manual), [phone]- Ultrasound, Patient/Family education, Balance training, Stair training, Joint mobilization, DME instructions, Cryotherapy, and Moist heat  PLAN FOR NEXT SESSION: Trial medium foot up brace (pt to bring different pair of tennis shoes).  ABC scale.  Check on HEP; LE strengthening; SCI-FIT for strengthening and reciprocal movement; SLS activities; balance; increase R LE step length and foot clearance; leg press?; tall kneeling?   Damien Caulk, PT 01/14/2024, 4:17 PM

## 2024-01-20 ENCOUNTER — Ambulatory Visit: Admitting: Physical Therapy

## 2024-01-20 ENCOUNTER — Encounter: Payer: Self-pay | Admitting: Physical Therapy

## 2024-01-20 VITALS — BP 134/79 | HR 74

## 2024-01-20 DIAGNOSIS — C719 Malignant neoplasm of brain, unspecified: Secondary | ICD-10-CM | POA: Diagnosis present

## 2024-01-20 DIAGNOSIS — R29818 Other symptoms and signs involving the nervous system: Secondary | ICD-10-CM | POA: Diagnosis present

## 2024-01-20 DIAGNOSIS — R260 Ataxic gait: Secondary | ICD-10-CM | POA: Diagnosis present

## 2024-01-20 DIAGNOSIS — R262 Difficulty in walking, not elsewhere classified: Secondary | ICD-10-CM | POA: Diagnosis present

## 2024-01-20 NOTE — Therapy (Signed)
 OUTPATIENT PHYSICAL THERAPY NEURO TREATMENT  Patient Name: [name] MRN: [id number] DOB:03-04-1960, 63 y.o., male Today's Date: 01/20/2024   PCP: Dwight Trula SQUIBB, MD REFERRING PROVIDER: Camalier, Curry Scarce, NP  END OF SESSION:  PT End of Session - 01/20/24 1536     Visit Number 16    Number of Visits 17   16 plus Eval   Date for Recertification  01/23/24    Authorization Type [employer]    Authorization Time Period 12/01/23 - 01/26/24    Authorization - Visit Number 16    Authorization - Number of Visits 17    Progress Note Due on Visit 10    PT Start Time 1534    PT Stop Time 1628    PT Time Calculation (min) 54 min    Equipment Utilized During Treatment Gait belt    Activity Tolerance Patient tolerated treatment well    Behavior During Therapy WFL for tasks assessed/performed             Past Medical History:  Diagnosis Date   ALLERGIC RHINITIS 01/09/2009   Glioblastoma multiforme (HCC)    stage IV   HYPERTENSION 01/09/2009   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   Left Temple   SEIZURE DISORDER 01/09/2009   Thyroid  disease    Hypothyroidism   Past Surgical History:  Procedure Laterality Date   CRANIOTOMY  2009   glioblastoma   Patient Active Problem List   Diagnosis Date Noted   Hypothyroidism 04/28/2015   GBM (glioblastoma multiforme) (HCC) 01/03/2011   Essential hypertension 01/09/2009   Allergic rhinitis 01/09/2009   SEIZURE DISORDER 01/09/2009    ONSET DATE: 2010 Stage 4 brain CA per pt report  REFERRING DIAG: C71.2 (ICD-10-CM) - Malignant neoplasm of temporal lobe  THERAPY DIAG:  Other symptoms and signs involving the nervous system  Difficulty in walking, not elsewhere classified  GBM (glioblastoma multiforme) (HCC)  Ataxic gait  Rationale for Evaluation and Treatment: Rehabilitation  SUBJECTIVE:                                                                                                                                                                                              SUBJECTIVE STATEMENT: Pt reports going to funeral this afternoon before coming to therapy.  Pt reports he doesn't have any shoes that will fit the foot up brace.  No acute changes.  No recent falls. 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  PMH also includes: Spastic hemiplegia affecting right dominant side (CMS/HHS-HCC), sub-acute Cerebral infarct (CMS-HCC), seizures, htn, anemia, asthma, hypopotassemia.  Pt also reports having visual cut R side.  PAIN:  Are you having pain? Yes: NPRS scale: 0/10 currently Pain location: R UE and B LE's Pain description: stiff; tight Aggravating factors: not exercising or exercising too much Relieving factors: movement (not too much or too little)  PRECAUTIONS: Fall; R field visual cut  RED FLAGS: None   WEIGHT BEARING RESTRICTIONS: No  FALLS: Has patient fallen in last 6 months? No  LIVING ENVIRONMENT: Lives with: lives with their spouse Lives in: House/apartment Stairs: No Has following equipment at home: Single point cane, Quad cane small base, Walker - 2 wheeled, Crutches, shower chair, bed side commode, and Grab bars  PLOF: Independent with gait and Independent with transfers; assist putting on the belt, coat, and tie (extra time/effort to perform on own)  PATIENT GOALS: 5 years from now I still want to be walking.  Improve walking pattern and balance.  Don't want to regress; want to get better.  OBJECTIVE:  Note: Objective measures were completed at Evaluation unless otherwise noted.  DIAGNOSTIC FINDINGS: 11/11/23 MRI Brain with and without contrast: IMPRESSION: Stable postoperative changes within the left temporal lobe.  Compared to 07/29/2023, similar enhancement along the resection margin and inferomedial left temporal lobe. No evidence of local recurrence or metastatic disease within the brain. BT-RADS 2a.   COGNITION: Overall cognitive status: Within functional limits for tasks assessed   SENSATION: Not tested (pt reports no concerns)  COORDINATION: Impaired R LE heel to shin in sitting  MUSCLE TONE: RLE: Hypertonic  POSTURE: rounded shoulders and forward head  LOWER EXTREMITY ROM:     Active  Right Eval Left Eval  Hip flexion [facility]  Hip extension    Hip abduction [facility]  Hip adduction    Hip internal rotation    Hip external rotation    Knee flexion [facility] WFL  Knee extension [facility] WFL  Ankle dorsiflexion WFL (*PROM) WFL  Ankle plantarflexion    Ankle inversion    Ankle eversion     (Blank rows = not tested)  LOWER EXTREMITY MMT:    MMT Right Eval Left Eval  Hip flexion 4+/5 5/5  Hip extension    Hip abduction    Hip adduction    Hip internal rotation    Hip external rotation    Knee flexion 4-/5 5/5  Knee extension 4-/5 5/5  Ankle dorsiflexion 2+/5 5/5  Ankle plantarflexion 2+/5 >3/5  Ankle inversion    Ankle eversion    (Blank rows = not tested)  BED MOBILITY:  Not tested  Pt reports being independent at home  TRANSFERS:  Requires L UE support Sit to stand: SBA  Assistive device utilized: None     Stand to sit: SBA  Assistive device utilized: None     Chair to chair: SBA  Assistive device utilized: None       STAIRS: Not tested.  Pt reports able to do stairs with use of railing. GAIT: Findings: Gait Characteristics: step through pattern, decreased arm swing- Right, decreased arm swing- Left, decreased step length- Right, decreased step length- Left, decreased stance time- Right, decreased stride length, decreased hip/knee flexion- Right, decreased ankle dorsiflexion- Right, circumduction- Right, Right foot flat, ataxic, decreased trunk rotation, and poor foot  clearance- Right, Distance walked: Clinic distances, Assistive device utilized:None, Level  of assistance: SBA, and Comments: R LE externally rotated; R UE flexed and at side; increased L lateral weight shift  FUNCTIONAL TESTS:  5 times sit to stand: 14.19 seconds (decreased control/quality of movement R LE 3rd-5th stand); use of L UE; on Eval Timed up and go (TUG): 12.35 seconds (did trial run first); on Eval 6 minute walk test: 671 feet (decreased R foot clearance and gait speed with increased distance ambulating) 12/01/23 Functional gait assessment: 11/30 (12/01/23)  PATIENT SURVEYS:  ABC scale: 70.31% 12/15/23 The Activities-Specific Balance Confidence (ABC) Scale 0% 10 20 30  40 50 60 70 80 90 100% No confidence<->completely confident  "How confident are you that you will not lose your balance or become unsteady when you . . .   Date tested 12/15/23 01/20/24  Walk around the house 75% 100%  2. Walk up or down stairs 70% with use of railing 70% with use of railing  3. Bend over and pick up a slipper from in front of a closet floor 50% 100%  4. Reach for a small can off a shelf at eye level 100% 100%  5. Stand on tip toes and reach for something above your head 50% 80%  6. Stand on a chair and reach for something 50% 30%  7. Sweep the floor 100% 80%  8. Walk outside the house to a car parked in the driveway 100% 100%  9. Get into or out of a car 30% (getting into car no problem; difficulties getting out of car) 80%  10. Walk across a parking lot to the mall 80% 50%  11. Walk up or down a ramp 80% 50%  12. Walk in a crowded mall where people rapidly walk past you 50% 50%  13. Are bumped into by people as you walk through the mall 70% 50%  14. Step onto or off of an escalator while you are holding onto the railing 100% 80%  15. Step onto or off an escalator while holding onto parcels such that you cannot hold onto the railing 70% 0%  16. Walk outside on icy sidewalks 50% 3%  Total:  1125/16 70.31% Total: 1023/16 = 63.93%                                                                                                                                 TREATMENT DATE: 01/20/24  Self Care: BP and HR taken in sitting at rest beginning of session (see below for details). Vitals:   01/20/24 1540  BP: 134/79  Pulse: 74   ABC Scale (see above for details)  Therapeutic Exercise: SciFit multi-peaks up to level 10 for 8 minutes using BUE/BLEs for neural priming for reciprocal movement, dynamic cardiovascular warmup and increased amplitude of stepping. RPE of 8/10 with HR 80 bpm following activity.  Average stride length 11.0 inches.  Reviewed and updated HEP: Standing March with Counter Support  x10 reps B LE's with green  theraband around distal thighs Standing Hip Abduction with Unilateral Counter Support  x10 reps B LE's with green theraband around distal thighs Heel Raises with Counter Support x10 reps Supine Bridge with Mini Swiss Ball Between Knees: pt reports he has been doing this exercise at home (no questions/concerns) Standing Hip Extension with Counter Support x10 reps B LE's with green theraband around distal thighs Standing Knee Flexion with Counter Support  x10 reps B LE's (decreased quality noted R LE)  PATIENT EDUCATION:  Education details:  Updated and reviewed HEP.  Discussed remaining visit.  ABC Scale. Person educated: Patient Education method: Explanation and Verbal cues Education comprehension: verbalized understanding  HOME EXERCISE PROGRAM: Access Thomas: [email or web address] URL: [email or web address] Date: 01/20/2024 Prepared by: Damien Caulk  Exercises - Standing March with Counter Support  - 1 x daily - 5 x weekly - 2-3 sets - 10 reps - Standing Hip Abduction with Unilateral Counter Support  - 1 x daily - 5 x weekly - 2-3 sets - 10 reps - Heel Raises with Counter Support  - 1 x daily - 5 x weekly - 2-3 sets - 10 reps - Supine Bridge with  Mini Swiss Ball Between Knees  - 1 x daily - 5 x weekly - 2-3 sets - 10 reps - Standing Hip Extension with Counter Support  - 1 x daily - 5 x weekly - 2-3 sets - 10 reps - Standing Knee Flexion with Counter Support  - 1 x daily - 5 x weekly - 2-3 sets - 10 reps  GOALS: Goals reviewed with patient? Yes  SHORT TERM GOALS: Target date: 12/26/23 (assessed 12/25/23)  Pt will be independent with initial HEP in order to improve strength and balance in order to decrease fall risk and improve function at home for ADL's and at work.  Baseline: Goal status: MET (reviewed and discussed with pt)  2.  Assess (to demonstrate improvement in cardiopulmonary endurance and community ambulation).  Baseline: 671 feet 12/01/23 Goal status: MET (assessed 12/01/23)  3.  Assess Functional Gait Assessment (to reduce fall risk and improve dynamic gait safety with community ambulation). Baseline: 11/30 (12/01/23) Goal status: MET (assessed 12/01/23)  4.  Pt will demonstrate improved R foot clearance for safety with gait.  Baseline:  Goal status: MET (pt reports improvement; pt's wife and friend also commenting to pt regarding improvement; improvement also noted via observation in clinic)    LONG TERM GOALS: Target date: 01/23/24  Pt will decrease 5 Time Sit to Stand by at least 3 seconds (and quality of movement) in order to demonstrate clinically significant improvement in LE strength.  Baseline: 14.19 seconds (decreased control/quality of movement R LE 3rd-5th stand; use of L UE) 11/24/23 Goal status: INITIAL  2.  Pt will be independent with final HEP in order to improve strength and balance in order to decrease fall risk and improve function at home for ADL's and at work.  Baseline:  Goal status: INITIAL  3.  Patient will be independent in bending down towards floor and picking up small object (<5 pounds) and then stand back up without loss of balance as to improve ability to pick up and clean up room  at home.  Baseline:  Goal status: INITIAL  4.  Pt will increase by at least 63m (151ft) in order to demonstrate clinically significant improvement in cardiopulmonary endurance and community ambulation.  Baseline: 671 feet 12/01/23 Goal status: INITIAL  5.  Patient will increase Functional Gait Assessment score to >20/30  as to reduce fall risk and improve dynamic gait safety with community ambulation.  Baseline: 11/30 (12/01/23) Goal status: INITIAL   ASSESSMENT:  CLINICAL IMPRESSION: Patient was seen today for physical therapy treatment to address strength, HEP, and ABC scale.  Focused session on overall strengthening, reviewing and updating HEP (pt given green theraband to progress from red theraband), and pt filled out ABC Scale (pt initially filled out form but did not appear to fully understand directions based on initial answers so therapist reviewed directions with pt and pt answered questions again).  Pt's confidence that he will not lose his balance or become unsteady (when performing noted activities) score was 63.93%.  Pt's score actually decreased from initial assessment (70.31% on 12/15/23).  Pt reporting feeling like his balance was a lot better since starting therapy though (despite score decreasing); pt appearing to be very focused on upcoming potential snow/ice concerns later this week and pt reporting he did not feel confident at all with any icy conditions (pt avoids these conditions).  Unable to trial foot up brace d/t pt not having any shoes that were compatible.  Pt aware next visit is last planned PT visit.   Continue POC.  OBJECTIVE IMPAIRMENTS: Abnormal gait, decreased activity tolerance, decreased balance, decreased cognition, decreased coordination, decreased endurance, decreased knowledge of condition, decreased knowledge of use of DME, decreased mobility, difficulty walking, decreased strength, impaired tone, and impaired UE functional use.   ACTIVITY LIMITATIONS:  carrying, lifting, bending, sitting, standing, squatting, stairs, transfers, continence, bathing, toileting, dressing, reach over head, locomotion level, and caring for others  PARTICIPATION LIMITATIONS: meal prep, cleaning, laundry, driving, shopping, community activity, occupation, and yard work  PERSONAL FACTORS: Age, Past/current experiences, Time since onset of injury/illness/exacerbation, and 3+ comorbidities: PMH including Left temporal glioblastoma, SDH L, spastic hemiplegia, seizures, visual cut R side are also affecting patient's functional outcome.   REHAB POTENTIAL: Good  CLINICAL DECISION MAKING: Evolving/moderate complexity  EVALUATION COMPLEXITY: Moderate  PLAN:  PT FREQUENCY: 2x/week  PT DURATION: 8 weeks  PLANNED INTERVENTIONS: [id number]- PT Re-evaluation, 97750- Physical Performance Testing, 97110-Therapeutic exercises, 97530- Therapeutic activity, [id number]- Neuromuscular re-education, 97535- Self Care, [id number]- Manual therapy, [id number]- Gait training, [phone]- Orthotic Initial, [phone]- Orthotic/Prosthetic subsequent, [phone]- Aquatic Therapy, [phone]- Electrical stimulation (manual), [phone]- Ultrasound, Patient/Family education, Balance training, Stair training, Joint mobilization, DME instructions, Cryotherapy, and Moist heat  PLAN FOR NEXT SESSION: Assess LTG's and discharge from PT.  Check on HEP; LE strengthening; SCI-FIT for strengthening and reciprocal movement; SLS activities; balance; increase R LE step length and foot clearance.   Damien Caulk, PT 01/20/2024, 8:35 PM

## 2024-01-22 ENCOUNTER — Encounter: Payer: Self-pay | Admitting: Physical Therapy

## 2024-01-22 ENCOUNTER — Ambulatory Visit: Admitting: Physical Therapy

## 2024-01-22 VITALS — BP 133/83 | HR 73

## 2024-01-22 DIAGNOSIS — R29818 Other symptoms and signs involving the nervous system: Secondary | ICD-10-CM

## 2024-01-22 DIAGNOSIS — C719 Malignant neoplasm of brain, unspecified: Secondary | ICD-10-CM

## 2024-01-22 DIAGNOSIS — R262 Difficulty in walking, not elsewhere classified: Secondary | ICD-10-CM

## 2024-01-22 DIAGNOSIS — R260 Ataxic gait: Secondary | ICD-10-CM

## 2024-01-22 NOTE — Therapy (Signed)
 OUTPATIENT PHYSICAL THERAPY NEURO TREATMENT: DISCHARGE NOTE   PHYSICAL THERAPY DISCHARGE SUMMARY  Visits from Start of Care: 17  Current functional level related to goals / functional outcomes: Independent with ambulation. See below for goals and functional outcomes.   Remaining deficits: Higher level balance.  R sided weakness.   Education / Equipment: Encouraged pt to join a gym (pt reports having free membership via insurance) and find work-out partner for consistent motivation.  Continue HEP.   Patient agrees to discharge. Patient goals were mostly met. Patient is being discharged due to being pleased with the current functional level.  Damien Caulk, PT   Patient Name: [name] MRN: [id number] DOB:04-08-1960, 62 y.o., male Today's Date: 01/22/2024   PCP: Dwight Trula SQUIBB, MD REFERRING PROVIDER: Camalier, Curry Scarce, NP  END OF SESSION:  PT End of Session - 01/22/24 1537     Visit Number 17    Number of Visits 17   16 plus Eval   Date for Recertification  01/23/24    Authorization Type [employer] Medicare    Authorization Time Period 12/01/23 - 01/26/24    Authorization - Visit Number 17    Authorization - Number of Visits 17    Progress Note Due on Visit 10    PT Start Time 1535    PT Stop Time 1620    PT Time Calculation (min) 45 min    Equipment Utilized During Treatment Gait belt    Activity Tolerance Patient tolerated treatment well    Behavior During Therapy WFL for tasks assessed/performed             Past Medical History:  Diagnosis Date   ALLERGIC RHINITIS 01/09/2009   Glioblastoma multiforme (HCC)    stage IV   HYPERTENSION 01/09/2009   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   Left [place]   SEIZURE DISORDER 01/09/2009   Thyroid  disease    Hypothyroidism   Past Surgical History:  Procedure Laterality Date   CRANIOTOMY  2009   glioblastoma   Patient Active Problem List   Diagnosis Date Noted   Hypothyroidism 04/28/2015    GBM (glioblastoma multiforme) (HCC) 01/03/2011   Essential hypertension 01/09/2009   Allergic rhinitis 01/09/2009   SEIZURE DISORDER 01/09/2009    ONSET DATE: 2010 Stage 4 brain CA per pt report  REFERRING DIAG: C71.2 (ICD-10-CM) - Malignant neoplasm of temporal lobe  THERAPY DIAG:  Other symptoms and signs involving the nervous system  Difficulty in walking, not elsewhere classified  GBM (glioblastoma multiforme) (HCC)  Ataxic gait  Rationale for Evaluation and Treatment: Rehabilitation  SUBJECTIVE:  SUBJECTIVE STATEMENT: No acute changes.  Pt aware today is last planned therapy appt.  No recent falls. 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  PMH also includes: Spastic hemiplegia affecting right dominant side (CMS/HHS-HCC), sub-acute Cerebral infarct (CMS-HCC), seizures, htn, anemia, asthma, hypopotassemia.  Pt also reports having visual cut R side.  PAIN:  Are you having pain? Yes: NPRS scale: 0/10 currently Pain location: R UE and B LE's Pain description: stiff; tight Aggravating factors: not exercising or exercising too much Relieving factors: movement (not too much or too little)  PRECAUTIONS: Fall; R field visual cut  RED FLAGS: None   WEIGHT BEARING RESTRICTIONS: No  FALLS: Has patient fallen in last 6 months? No  LIVING ENVIRONMENT: Lives with: lives with their spouse Lives in: House/apartment Stairs: No Has following equipment at home: Single point cane, Quad cane small base, Walker - 2 wheeled, Crutches, shower chair, bed  side commode, and Grab bars  PLOF: Independent with gait and Independent with transfers; assist putting on the belt, coat, and tie (extra time/effort to perform on own)  PATIENT GOALS: 5 years from now I still want to be walking.  Improve walking pattern and balance.  Don't want to regress; want to get better.  OBJECTIVE:  Note: Objective measures were completed at Evaluation unless otherwise noted.  DIAGNOSTIC FINDINGS: 11/11/23 MRI Brain with and without contrast: IMPRESSION: Stable postoperative changes within the left temporal lobe. Compared to 07/29/2023, similar enhancement along the resection margin and inferomedial left temporal lobe. No evidence of local recurrence or metastatic disease within the brain. BT-RADS 2a.   COGNITION: Overall cognitive status: Within functional limits for tasks assessed   SENSATION: Not tested (pt reports no concerns)  COORDINATION: Impaired R LE heel to shin in sitting  MUSCLE TONE: RLE: Hypertonic  POSTURE: rounded shoulders and forward head  LOWER EXTREMITY ROM:     Active  Right Eval Left Eval  Hip flexion [facility]  Hip extension    Hip abduction [facility]  Hip adduction    Hip internal rotation    Hip external rotation    Knee flexion [facility] WFL  Knee extension [facility] WFL  Ankle dorsiflexion WFL (*PROM) WFL  Ankle plantarflexion    Ankle inversion    Ankle eversion     (Blank rows = not tested)  LOWER EXTREMITY MMT:    MMT Right Eval Left Eval  Hip flexion 4+/5 5/5  Hip extension    Hip abduction    Hip adduction    Hip internal rotation    Hip external rotation    Knee flexion 4-/5 5/5  Knee extension 4-/5 5/5  Ankle dorsiflexion 2+/5 5/5  Ankle plantarflexion 2+/5 >3/5  Ankle inversion    Ankle eversion    (Blank rows = not tested)  BED MOBILITY:  Not tested  Pt reports being independent at home  TRANSFERS:  Requires L UE support Sit to stand: SBA  Assistive device utilized: None     Stand to sit: SBA  Assistive  device utilized: None     Chair to chair: SBA  Assistive device utilized: None       STAIRS: Not tested.  Pt reports able to do stairs with use of railing. GAIT: Findings: Gait Characteristics: step through pattern,  decreased arm swing- Right, decreased arm swing- Left, decreased step length- Right, decreased step length- Left, decreased stance time- Right, decreased stride length, decreased hip/knee flexion- Right, decreased ankle dorsiflexion- Right, circumduction- Right, Right foot flat, ataxic, decreased trunk rotation, and poor foot clearance- Right, Distance walked: Clinic distances, Assistive device utilized:None, Level of assistance: SBA, and Comments: R LE externally rotated; R UE flexed and at side; increased L lateral weight shift  FUNCTIONAL TESTS:  5 times sit to stand: 14.19 seconds (decreased control/quality of movement R LE 3rd-5th stand); use of L UE; on Eval.  11.56 seconds (use of L UE; good control/quality) 01/22/24  Timed up and go (TUG): 12.35 seconds (did trial run first); on Eval. 6 minute walk test: 671 feet (decreased R foot clearance and gait speed with increased distance ambulating) 12/01/23; 851 feet 01/22/24 Functional gait assessment: 11/30 (12/01/23); 18/30 01/22/24  FGA FUNCTIONAL GAIT ASSESSMENT  Date: 01/22/24 Score  GAIT LEVEL SURFACE Instructions: Walk at your normal speed from here to the next [place] (6 m) [20 ft]. (1) Moderate impairment-Walks 6 m (20 ft), slow speed, abnormal gait pattern, evidence for imbalance, or deviates 25.4 - 38.1 cm (10 -15 in) outside of the 30.48-cm (12-in) walkway width. Requires more than 7 seconds to ambulate 6 m (20 ft). 7.31 seconds  2.   CHANGE IN GAIT SPEED Instructions: Begin walking at your normal pace (for 1.5 m [5 ft]). When I tell you "go," walk as fast as you can (for 1.5 m [5 ft]). When I tell you "slow," walk as slowly as you can (for 1.5 m [5 ft]. (2) Mild impairment - Is able to change speed but demonstrates mild gait  deviations, deviates 15.24 -25.4 cm (6 -10 in) outside of the 30.48-cm (12-in) walkway width, or no gait deviations but unable to achieve a significant change in velocity, or uses an assistive device  3.    GAIT WITH HORIZONTAL HEAD TURNS Instructions: Walk from here to the next [place] 6 m (20 ft) away. Begin walking at your normal pace. Keep walking straight; after 3 steps, turn your head to the right and keep walking straight while looking to the right. After 3 more steps, turn your head to the left and keep walking straight while looking left. Continue alternating looking right and left. (3) Normal - Performs head turns smoothly with no change in gait. Deviates no more than 15.24 cm (6 in) outside 30.48-cm (12-in) walkway width.  4.   GAIT WITH VERTICAL HEAD TURNS Instructions: Walk from here to the next [place] (6 m [20 ft]). Begin walking at your normal pace. Keep walking straight; after 3 steps, tip your head up and keep walking straight while looking up. After 3 more steps, tip your head down, keep walking straight while looking down. Continue  alternating looking up and down every 3 steps until you have completed 2 repetitions in each direction. (3) Normal - Performs head turns with no change in gait. Deviates no more than 15.24 cm (6 in) outside 30.48-cm (12-in) walkway width.  5.  GAIT AND PIVOT TURN Instructions: Begin with walking at your normal pace. When I tell you, "turn and stop," turn as quickly as you can to face the opposite direction and stop. (3) Normal - Pivot turns safely within 3 seconds and stops quickly with no loss of balance  6.   STEP OVER OBSTACLE Instructions: Begin walking at your normal speed. When you come to the shoe box, step over it, not around it,  and keep walking. (1) Moderate impairment - Is able to step over one shoe box (11.43 cm [4.5 in] total height) but must slow down and adjust steps to clear box safely. May require verbal cueing.  7.   GAIT WITH NARROW BASE OF  SUPPORT Instructions: Walk on the floor with arms folded across the chest, feet aligned heel to toe in tandem for a distance of 3.6 m [12 ft]. The number of steps taken in a straight line are counted for a maximum of 10 steps. (0) Severe impairment - Ambulates less than 4 steps heel to toe or cannot perform without assistance.  8.   GAIT WITH EYES CLOSED Instructions: Walk at your normal speed from here to the next [place] (6 m [20 ft]) with your eyes closed. (1) Moderate impairment - Walks 6 m (20 ft), slow speed, abnormal gait pattern, evidence for imbalance, deviates 25.4 -38.1 cm (10 -15 in) outside 30.48-cm (12-in) walkway width. Requires more than 9 seconds to ambulate 6 m (20 ft). 12.35 seconds  9.   AMBULATING BACKWARDS Instructions: Walk backwards until I tell you to stop (2) Mild impairment - Walks 6 m (20 ft), uses assistive device, slower speed, mild gait deviations, deviates 15.24 -25.4 cm (6 -10 in) outside 30.48-cm (12-in) walkway width 19.62 seconds  10. STEPS Instructions: Walk up these stairs as you would at home (ie, using the rail if necessary). At the top turn around and walk down. (2) Mild impairment-Alternating feet, must use rail.  Total 18/30   Interpretation of scores: Non-Specific Older Adults Cutoff Score: <=22/30 = risk of falls Parkinson's Disease Cutoff score <15/30= fall risk (Hoehn & Yahr 1-4)  Minimally Clinically Important Difference (MCID)  Stroke (acute, subacute, and chronic) = MDC: 4.2 points Vestibular (acute) = MDC: 6 points Community Dwelling Older Adults =  MCID: 4 points Parkinson's Disease  =  MDC: 4.3 points  (Academy of Neu[employer]ic Physical Therapy (nd). Functional Gait Assessment. Retrieved from https://www.neuropt.org/docs/default-source/cpgs/core-outcome-measures/function-gait-assessment-pocket-guide-proof9-(2).pdf?sfvrsn=b69f35043_0.)   PATIENT SURVEYS:  ABC scale: 70.31% 12/15/23. 63.93% on 01/20/24 (see 01/20/24 note for details)                                                                                                                              TREATMENT DATE: 01/22/24  Self Care: BP and HR taken in sitting at rest beginning of session (see below for details). Vitals:   01/22/24 1541  BP: 133/83  Pulse: 73  See pt education below for education details.  Physical Performance Testing: See LTG's below, Functional tests above, and clinical impression for details. 5 time sit to stand (11.56 seconds); picking object off floor (independent); (851 feet); FGA (18/30) Results reviewed with pt  PATIENT EDUCATION:  Education details:  Continue HEP.  Encouraged pt to join a gym and find a work-out partner for consistent motivation.  LTG results.  Plan for discharge from PT today. Person educated: Patient Education method: Explanation and Verbal cues Education  comprehension: verbalized understanding  HOME EXERCISE PROGRAM: Access Code: [email or web address] URL: [email or web address] Date: 01/20/2024 Prepared by: Damien Caulk  Exercises - Standing March with Counter Support  - 1 x daily - 5 x weekly - 2-3 sets - 10 reps - Standing Hip Abduction with Unilateral Counter Support  - 1 x daily - 5 x weekly - 2-3 sets - 10 reps - Heel Raises with Counter Support  - 1 x daily - 5 x weekly - 2-3 sets - 10 reps - Supine Bridge with Mini Swiss Ball Between Knees  - 1 x daily - 5 x weekly - 2-3 sets - 10 reps - Standing Hip Extension with Counter Support  - 1 x daily - 5 x weekly - 2-3 sets - 10 reps - Standing Knee Flexion with Counter Support  - 1 x daily - 5 x weekly - 2-3 sets - 10 reps  GOALS: Goals reviewed with patient? Yes  SHORT TERM GOALS: Target date: 12/26/23 (assessed 12/25/23)  Pt will be independent with initial HEP in order to improve strength and balance in order to decrease fall risk and improve function at home for ADL's and at work.  Baseline: Goal status: MET (reviewed and discussed with pt)  2.  Assess  (to demonstrate improvement in cardiopulmonary endurance and community ambulation).  Baseline: 671 feet 12/01/23 Goal status: MET (assessed 12/01/23)  3.  Assess Functional Gait Assessment (to reduce fall risk and improve dynamic gait safety with community ambulation). Baseline: 11/30 (12/01/23) Goal status: MET (assessed 12/01/23)  4.  Pt will demonstrate improved R foot clearance for safety with gait.  Baseline:  Goal status: MET (pt reports improvement; pt's wife and friend also commenting to pt regarding improvement; improvement also noted via observation in clinic)    LONG TERM GOALS: Target date: 01/23/24  Pt will decrease 5 Time Sit to Stand by at least 3 seconds (and quality of movement) in order to demonstrate clinically significant improvement in LE strength.  Baseline: 14.19 seconds (decreased control/quality of movement R LE 3rd-5th stand; use of L UE) 11/24/23.  11.56 seconds (use of L UE; good control/quality) 01/22/24 Goal status: PROGRESSING (improved quality noted)  2.  Pt will be independent with final HEP in order to improve strength and balance in order to decrease fall risk and improve function at home for ADL's and at work.  Baseline: Issued Goal status: MET  3.  Patient will be independent in bending down towards floor and picking up small object (<5 pounds) and then stand back up without loss of balance as to improve ability to pick up and clean up room at home.  Baseline:  Goal status: MET  4.  Pt will increase by at least 80m (157ft) in order to demonstrate clinically significant improvement in cardiopulmonary endurance and community ambulation.  Baseline: 671 feet 12/01/23; 851 feet 01/22/24 Goal status: MET  5.  Patient will increase Functional Gait Assessment score to >20/30 as to reduce fall risk and improve dynamic gait safety with community ambulation.  Baseline: 11/30 (12/01/23); 18/30 (01/22/24) Goal status:  PROGRESSING   ASSESSMENT:  CLINICAL IMPRESSION: Patient was seen today for physical therapy treatment to address LTG's and plan for discharge from OP PT.  Pt met 3/5 LTG's and progressing with 2/5 LTG's.  Pt improved 5 time sit to stand from 14.19 seconds (on 11/24/23) to 11.56 seconds today; pt did not meet goal of 3 second improvement but pt did demonstrate improved quality of control of movement compared  to initial assessment on eval; (>15 seconds = increased risk of falls).  Pt improved FGA score from 11/30 (12/01/23) to 18/30 today (did not meet goal of >20/30); (</= 22/30 = risk of falls in elderly).  Pt improved from 671 feet on 12/01/23 to 851 feet today.  Pt demonstrating overall improved strength, balance, and cardiovascular endurance.  Pt appearing pleased with current status.  Plan for discharge from PT today (pt aware and in agreement; discussed at previous session).  OBJECTIVE IMPAIRMENTS: Abnormal gait, decreased activity tolerance, decreased balance, decreased cognition, decreased coordination, decreased endurance, decreased knowledge of condition, decreased knowledge of use of DME, decreased mobility, difficulty walking, decreased strength, impaired tone, and impaired UE functional use.   ACTIVITY LIMITATIONS: carrying, lifting, bending, sitting, standing, squatting, stairs, transfers, continence, bathing, toileting, dressing, reach over head, locomotion level, and caring for others  PARTICIPATION LIMITATIONS: meal prep, cleaning, laundry, driving, shopping, community activity, occupation, and yard work  PERSONAL FACTORS: Age, Past/current experiences, Time since onset of injury/illness/exacerbation, and 3+ comorbidities: PMH including Left temporal glioblastoma, SDH L, spastic hemiplegia, seizures, visual cut R side are also affecting patient's functional outcome.   REHAB POTENTIAL: Good  CLINICAL DECISION MAKING: Evolving/moderate complexity  EVALUATION COMPLEXITY:  Moderate  PLAN:  PT FREQUENCY: 2x/week  PT DURATION: 8 weeks  PLANNED INTERVENTIONS: [id number]- PT Re-evaluation, 97750- Physical Performance Testing, 97110-Therapeutic exercises, 97530- Therapeutic activity, [id number]- Neuromuscular re-education, 97535- Self Care, [id number]- Manual therapy, [id number]- Gait training, [phone]- Orthotic Initial, [phone]- Orthotic/Prosthetic subsequent, [phone]- Aquatic Therapy, [phone]- Electrical stimulation (manual), [phone]- Ultrasound, Patient/Family education, Balance training, Stair training, Joint mobilization, DME instructions, Cryotherapy, and Moist heat  PLAN FOR NEXT SESSION: Discharge from PT 01/22/24.    Damien Caulk, PT 01/23/2024, 10:57 AM

## 2024-03-05 ENCOUNTER — Other Ambulatory Visit: Payer: Self-pay | Admitting: Urology

## 2024-03-23 ENCOUNTER — Other Ambulatory Visit: Payer: Self-pay | Admitting: Urology

## 2024-03-23 NOTE — Progress Notes (Addendum)
 Date of COVID positive in last 90 days:  PCP - Trula Brim, MD Cardiologist -   Chest x-ray - N/A EKG - N/A Stress Test - N/A ECHO - 05-22-16 [facility] Cardiac Cath - N/A Pacemaker/ICD device last checked:N/A Spinal Cord Stimulator:N/A  Bowel Prep - N/A  Sleep Study - N/A CPAP -   Fasting Blood Sugar - N/A Checks Blood Sugar _____ times a day  Last dose of GLP1 agonist-  N/A GLP1 instructions:  Do not take after     Last dose of SGLT-2 inhibitors-  N/A SGLT-2 instructions:  Do not take after     Blood Thinner Instructions: N/A Last dose:   Time: Aspirin Instructions:  ASA 81 Last Dose:  Activity level:  Can go up a flight of stairs and perform activities of daily living without stopping and without symptoms of chest pain or shortness of breath.  Able to exercise without symptoms  Unable to go up a flight of stairs without symptoms of     Anesthesia review: N/A  Patient denies shortness of breath, fever, cough and chest pain at PAT appointment  Patient verbalized understanding of instructions that were given to them at the PAT appointment. Patient was also instructed that they will need to review over the PAT instructions again at home before surgery.

## 2024-03-23 NOTE — Patient Instructions (Signed)
 SURGICAL WAITING ROOM VISITATION Patients having surgery or a procedure may have no more than 2 support people in the waiting area - these visitors may rotate.    Children under the age of 35 will not be allowed to visit due to the increase in respiratory illness  Children under the age of 58 must have an adult with them who is not the patient.  If the patient needs to stay at the hospital during part of their recovery, the visitor guidelines for inpatient rooms apply. Pre-op nurse will coordinate an appropriate time for 1 support person to accompany patient in pre-op.  This support person may not rotate.    Please refer to the [facility] website for the visitor guidelines for Inpatients (after your surgery is over and you are in a regular room).       Your procedure is scheduled on: 03-31-24   Report to [facility] Main Entrance    Report to admitting at 7:15 AM   Call this number if you have problems the morning of surgery [phone]   Do not eat food or drink liquids:After Midnight.           If you have questions, please contact your surgeons office.   FOLLOW  ANY ADDITIONAL PRE OP INSTRUCTIONS YOU RECEIVED FROM YOUR SURGEON'S OFFICE!!!     Oral Hygiene is also important to reduce your risk of infection.                                    Remember - BRUSH YOUR TEETH THE MORNING OF SURGERY WITH YOUR REGULAR TOOTHPASTE   Do NOT smoke after Midnight   Take these medicines the morning of surgery with A SIP OF WATER:    Levetiracetam  (Keppra )   Levothyroxine  (Synthroid )   Sertraline  (Zoloft )   Tamsulosin (Flomax)  Stop all vitamins and herbal supplements 7 days before surgery  Hold Aspirin as directed                              You may not have any metal on your body including  jewelry, and body piercing             Do not wear  lotions, powders, cologne, or deodorant              Men may shave face and neck.   Do not bring valuables to the hospital.  [facility] IS NOT RESPONSIBLE   FOR VALUABLES.   Contacts, dentures or bridgework may not be worn into surgery.   Bring small overnight bag day of surgery.   DO NOT BRING YOUR HOME MEDICATIONS TO THE HOSPITAL. PHARMACY WILL DISPENSE MEDICATIONS LISTED ON YOUR MEDICATION LIST TO YOU DURING YOUR ADMISSION IN THE HOSPITAL!              Please read over the following fact sheets you were given: IF YOU HAVE QUESTIONS ABOUT YOUR PRE-OP INSTRUCTIONS PLEASE CALL [phone] [place] or [phone]  If you received a COVID test during your pre-op visit  it is requested that you wear a mask when out in public, stay away from anyone that may not be feeling well and notify your surgeon if you develop symptoms. If you test positive for Covid or have been in contact with anyone that has tested positive in the last 10 days please notify you  careers adviser.  [place] - Preparing for Surgery Before surgery, you can play an important role.  Because skin is not sterile, your skin needs to be as free of germs as possible.  You can reduce the number of germs on your skin by washing with CHG (chlorahexidine gluconate) soap before surgery.  CHG is an antiseptic cleaner which kills germs and bonds with the skin to continue killing germs even after washing. Please DO NOT use if you have an allergy to CHG or antibacterial soaps.  If your skin becomes reddened/irritated stop using the CHG and inform your nurse when you arrive at Short Stay. Do not shave (including legs and underarms) for at least 48 hours prior to the first CHG shower.  You may shave your face/neck.  Please follow these instructions carefully:  1.  Shower with CHG Soap the night before surgery and the  morning of surgery.  2.  If you choose to wash your hair, wash your hair first as usual with your normal  shampoo.  3.  After you shampoo, rinse your hair and body thoroughly to remove the shampoo.                             4.  Use CHG as you would any other  liquid soap.  You can apply chg directly to the skin and wash.  Gently with a scrungie or clean washcloth.  5.  Apply the CHG Soap to your body ONLY FROM THE NECK DOWN.   Do   not use on face/ open                           Wound or open sores. Avoid contact with eyes, ears mouth and   genitals (private parts).                       Wash face,  Genitals (private parts) with your normal soap.             6.  Wash thoroughly, paying special attention to the area where your    surgery  will be performed.  7.  Thoroughly rinse your body with warm water from the neck down.  8.  DO NOT shower/wash with your normal soap after using and rinsing off the CHG Soap.                9.  Pat yourself dry with a clean towel.            10.  Wear clean pajamas.            11.  Place clean sheets on your bed the night of your first shower and do not  sleep with pets. Day of Surgery : Do not apply any lotions/deodorants the morning of surgery.  Please wear clean clothes to the hospital/surgery center.  FAILURE TO FOLLOW THESE INSTRUCTIONS MAY RESULT IN THE CANCELLATION OF YOUR SURGERY  PATIENT SIGNATURE_________________________________  NURSE SIGNATURE__________________________________  ________________________________________________________________________

## 2024-03-24 ENCOUNTER — Encounter (HOSPITAL_COMMUNITY): Payer: Self-pay

## 2024-03-24 ENCOUNTER — Other Ambulatory Visit: Payer: Self-pay

## 2024-03-24 ENCOUNTER — Encounter (HOSPITAL_COMMUNITY)
Admission: RE | Admit: 2024-03-24 | Discharge: 2024-03-24 | Disposition: A | Source: Ambulatory Visit | Attending: Urology

## 2024-03-24 VITALS — BP 152/84 | HR 72 | Temp 98.3°F | Resp 16 | Ht 67.0 in | Wt 194.8 lb

## 2024-03-24 DIAGNOSIS — I10 Essential (primary) hypertension: Secondary | ICD-10-CM | POA: Insufficient documentation

## 2024-03-24 DIAGNOSIS — R3916 Straining to void: Secondary | ICD-10-CM | POA: Insufficient documentation

## 2024-03-24 DIAGNOSIS — Z79899 Other long term (current) drug therapy: Secondary | ICD-10-CM | POA: Insufficient documentation

## 2024-03-24 DIAGNOSIS — Z01818 Encounter for other preprocedural examination: Secondary | ICD-10-CM | POA: Insufficient documentation

## 2024-03-24 DIAGNOSIS — N401 Enlarged prostate with lower urinary tract symptoms: Secondary | ICD-10-CM | POA: Insufficient documentation

## 2024-03-24 DIAGNOSIS — R569 Unspecified convulsions: Secondary | ICD-10-CM | POA: Insufficient documentation

## 2024-03-24 DIAGNOSIS — R338 Other retention of urine: Secondary | ICD-10-CM | POA: Insufficient documentation

## 2024-03-24 HISTORY — DX: Headache, unspecified: R51.9

## 2024-03-24 HISTORY — DX: Hypothyroidism, unspecified: E03.9

## 2024-03-24 HISTORY — DX: Unspecified osteoarthritis, unspecified site: M19.90

## 2024-03-24 LAB — BASIC METABOLIC PANEL WITH GFR
Anion gap: 9 (ref 5–15)
BUN: 28 mg/dL — ABNORMAL HIGH (ref 8–23)
CO2: 22 mmol/L (ref 22–32)
Calcium: 8.7 mg/dL — ABNORMAL LOW (ref 8.9–10.3)
Chloride: 112 mmol/L — ABNORMAL HIGH (ref 98–111)
Creatinine, Ser: 2.4 mg/dL — ABNORMAL HIGH (ref 0.61–1.24)
GFR, Estimated: 29 mL/min — ABNORMAL LOW
Glucose, Bld: 96 mg/dL (ref 70–99)
Potassium: 4.5 mmol/L (ref 3.5–5.1)
Sodium: 143 mmol/L (ref 135–145)

## 2024-03-24 LAB — CBC
HCT: 28.8 % — ABNORMAL LOW (ref 39.0–52.0)
Hemoglobin: 9.1 g/dL — ABNORMAL LOW (ref 13.0–17.0)
MCH: 34.7 pg — ABNORMAL HIGH (ref 26.0–34.0)
MCHC: 31.6 g/dL (ref 30.0–36.0)
MCV: 109.9 fL — ABNORMAL HIGH (ref 80.0–100.0)
Platelets: 170 10*3/uL (ref 150–400)
RBC: 2.62 MIL/uL — ABNORMAL LOW (ref 4.22–5.81)
RDW: 12.5 % (ref 11.5–15.5)
WBC: 3.3 10*3/uL — ABNORMAL LOW (ref 4.0–10.5)
nRBC: 0 % (ref 0.0–0.2)

## 2024-03-25 ENCOUNTER — Encounter (HOSPITAL_COMMUNITY): Payer: Self-pay

## 2024-03-25 NOTE — Progress Notes (Signed)
 " Case: [id number] Date/Time: 03/31/24 0915   Procedure: TURP (TRANSURETHRAL RESECTION OF PROSTATE) - TURP (TRANSURETHRAL RESECTION OF PROSTATE)   Anesthesia type: General   Diagnosis:      Hyperplasia of prostate with lower urinary tract symptoms (LUTS) [N40.1]     Feeling of incomplete bladder emptying [R39.14]     Straining on urination [R39.16]   Pre-op diagnosis: BENIGN PROSTATIC HYPERTROPHY   Location: [facility] PROCEDURE [facility] / [facility]   Surgeons: Cam Morene ORN, MD       DISCUSSION: [name] is a 64 yo male with PMH of HTN, Glioblastoma s/p resection (2009), chemo, radiation with residual R sided weakness and seizures, hypothyroid, arthritis, anemia, BPH.  Patient follows with Oncology at [facility] for hx of temporal lobe glioblastoma s/p craniotomy in 2009 followed by chemo and radiation. Had repeat craniotomy in 2014 for possible recurrence. Pathology showed hypercellular tissue with gliosis. Last seen in clinic on 02/24/24. Per Dr. Zulema:  Glioblastoma remains in long-term remission with stable postoperative changes on MRI and no new neurological symptoms. - Reviewed MRI confirming stability, no recurrence or new lesions. - Encouraged continued physical activity as tolerated.  Hx of seizures. On Keppra . Seizure free for 6+ years.  Pre op labs notable for significant AKI with SCr 2.4. Discussed with patient. He states that he has been urinating a lot. He has also been taking Meloxicam  reguarly. Advised to stop this medicine. Also advised to discuss with Dr. Cam today to see if surgery date needs to be moved up. Pre op labs also notable for worsening anemia. Hgb 9.1 down from 11 in Sept 2025 when checked at [facility]. Pt denies any bleeding issues. He was advised to f/u with PCP regarding this.  Dr. Hosea office notified.  VS: BP (!) 152/84   Pulse 72   Temp 36.8 C (Oral)   Resp 16   Ht 5' 7 (1.702 m)   Wt 88.4 kg   SpO2 97%   BMI 30.51 kg/m   PROVIDERS: Dwight Trula SQUIBB,  MD   LABS:  (all labs ordered are listed, but only abnormal results are displayed)  Labs Reviewed  CBC - Abnormal; Notable for the following components:      Result Value   WBC 3.3 (*)    RBC 2.62 (*)    Hemoglobin 9.1 (*)    HCT 28.8 (*)    MCV 109.9 (*)    MCH 34.7 (*)    All other components within normal limits  BASIC METABOLIC PANEL WITH GFR - Abnormal; Notable for the following components:   Chloride 112 (*)    BUN 28 (*)    Creatinine, Ser 2.40 (*)    Calcium 8.7 (*)    GFR, Estimated 29 (*)    All other components within normal limits      MRI BRAIN WITHOUT AND WITH CONTRAST 02/24/24:   IMPRESSION: Stable examination without evidence of disease progression.  Brain tumor follow-up score 2a: Tumor-related abnormalities are unchanged allowing for slice selection and technical factors.       EKG 03/24/24:  NSR Minimal voltage criteria for LVH, may be normal variant Inferior infarct, age undetermined   CV:  Past Medical History:  Diagnosis Date   ALLERGIC RHINITIS 01/09/2009   Arthritis    Glioblastoma multiforme (HCC)    stage IV   Headache    HYPERTENSION 01/09/2009   Hypothyroidism    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   Left Temple   SEIZURE DISORDER 01/09/2009  Thyroid  disease    Hypothyroidism    Past Surgical History:  Procedure Laterality Date   BRAIN SURGERY     For a brain bleed   CRANIOTOMY  02/19/2007   glioblastoma    MEDICATIONS:  aspirin EC 81 MG tablet   Cyanocobalamin  (VITAMIN B12 PO)   levETIRAcetam  (KEPPRA ) 500 MG tablet   levothyroxine  (SYNTHROID , LEVOTHROID) 75 MCG tablet   meloxicam  (MOBIC ) 15 MG tablet   sertraline  (ZOLOFT ) 25 MG tablet   tamsulosin (FLOMAX) 0.4 MG CAPS capsule   No current facility-administered medications for this encounter.   Burnard CHRISTELLA Odis DEVONNA [facility] Pre-Surgical Testing [facility] Phone [phone] 03/25/2024 12:32 PM        "

## 2024-03-26 ENCOUNTER — Other Ambulatory Visit: Payer: Self-pay | Admitting: Nurse Practitioner

## 2024-03-26 ENCOUNTER — Ambulatory Visit (HOSPITAL_BASED_OUTPATIENT_CLINIC_OR_DEPARTMENT_OTHER): Admission: RE | Admit: 2024-03-26 | Source: Ambulatory Visit

## 2024-03-26 ENCOUNTER — Telehealth: Payer: Self-pay | Admitting: Nurse Practitioner

## 2024-03-26 ENCOUNTER — Inpatient Hospital Stay

## 2024-03-26 ENCOUNTER — Other Ambulatory Visit: Payer: Self-pay

## 2024-03-26 ENCOUNTER — Inpatient Hospital Stay: Admitting: Nurse Practitioner

## 2024-03-26 ENCOUNTER — Telehealth: Payer: Self-pay | Admitting: *Deleted

## 2024-03-26 ENCOUNTER — Emergency Department (HOSPITAL_BASED_OUTPATIENT_CLINIC_OR_DEPARTMENT_OTHER)
Admission: EM | Admit: 2024-03-26 | Source: Ambulatory Visit | Attending: Emergency Medicine | Admitting: Emergency Medicine

## 2024-03-26 ENCOUNTER — Encounter (HOSPITAL_BASED_OUTPATIENT_CLINIC_OR_DEPARTMENT_OTHER): Payer: Self-pay

## 2024-03-26 ENCOUNTER — Encounter: Payer: Self-pay | Admitting: Nurse Practitioner

## 2024-03-26 ENCOUNTER — Other Ambulatory Visit: Payer: Self-pay | Admitting: *Deleted

## 2024-03-26 VITALS — BP 162/79 | HR 66 | Temp 97.9°F | Resp 18 | Ht 67.0 in | Wt 196.1 lb

## 2024-03-26 DIAGNOSIS — C719 Malignant neoplasm of brain, unspecified: Secondary | ICD-10-CM

## 2024-03-26 DIAGNOSIS — N19 Unspecified kidney failure: Secondary | ICD-10-CM

## 2024-03-26 DIAGNOSIS — D649 Anemia, unspecified: Secondary | ICD-10-CM

## 2024-03-26 DIAGNOSIS — N179 Acute kidney failure, unspecified: Secondary | ICD-10-CM | POA: Diagnosis present

## 2024-03-26 DIAGNOSIS — N139 Obstructive and reflux uropathy, unspecified: Secondary | ICD-10-CM

## 2024-03-26 LAB — CMP (CANCER CENTER ONLY)
ALT: 12 U/L (ref 0–44)
AST: 16 U/L (ref 15–41)
Albumin: 4.1 g/dL (ref 3.5–5.0)
Alkaline Phosphatase: 79 U/L (ref 38–126)
Anion gap: 12 (ref 5–15)
BUN: 29 mg/dL — ABNORMAL HIGH (ref 8–23)
CO2: 23 mmol/L (ref 22–32)
Calcium: 8.8 mg/dL — ABNORMAL LOW (ref 8.9–10.3)
Chloride: 110 mmol/L (ref 98–111)
Creatinine: 2.81 mg/dL — ABNORMAL HIGH (ref 0.61–1.24)
GFR, Estimated: 24 mL/min — ABNORMAL LOW
Glucose, Bld: 94 mg/dL (ref 70–99)
Potassium: 4.4 mmol/L (ref 3.5–5.1)
Sodium: 144 mmol/L (ref 135–145)
Total Bilirubin: 0.3 mg/dL (ref 0.0–1.2)
Total Protein: 7 g/dL (ref 6.5–8.1)

## 2024-03-26 LAB — URINALYSIS, COMPLETE (UACMP) WITH MICROSCOPIC
Bilirubin Urine: NEGATIVE
Glucose, UA: NEGATIVE mg/dL
Hgb urine dipstick: NEGATIVE
Ketones, ur: NEGATIVE mg/dL
Leukocytes,Ua: NEGATIVE
Nitrite: NEGATIVE
Protein, ur: NEGATIVE mg/dL
Specific Gravity, Urine: 1.01 (ref 1.005–1.030)
pH: 5 (ref 5.0–8.0)

## 2024-03-26 LAB — SAMPLE TO BLOOD BANK

## 2024-03-26 LAB — CBC WITH DIFFERENTIAL (CANCER CENTER ONLY)
Abs Immature Granulocytes: 0 10*3/uL (ref 0.00–0.07)
Basophils Absolute: 0 10*3/uL (ref 0.0–0.1)
Basophils Relative: 1 %
Eosinophils Absolute: 0.1 10*3/uL (ref 0.0–0.5)
Eosinophils Relative: 2 %
HCT: 27.5 % — ABNORMAL LOW (ref 39.0–52.0)
Hemoglobin: 9.5 g/dL — ABNORMAL LOW (ref 13.0–17.0)
Immature Granulocytes: 0 %
Lymphocytes Relative: 30 %
Lymphs Abs: 1.1 10*3/uL (ref 0.7–4.0)
MCH: 35.6 pg — ABNORMAL HIGH (ref 26.0–34.0)
MCHC: 34.5 g/dL (ref 30.0–36.0)
MCV: 103 fL — ABNORMAL HIGH (ref 80.0–100.0)
Monocytes Absolute: 0.3 10*3/uL (ref 0.1–1.0)
Monocytes Relative: 9 %
Neutro Abs: 2.1 10*3/uL (ref 1.7–7.7)
Neutrophils Relative %: 58 %
Platelet Count: 156 10*3/uL (ref 150–400)
RBC: 2.67 MIL/uL — ABNORMAL LOW (ref 4.22–5.81)
RDW: 12.4 % (ref 11.5–15.5)
WBC Count: 3.6 10*3/uL — ABNORMAL LOW (ref 4.0–10.5)
nRBC: 0 % (ref 0.0–0.2)

## 2024-03-26 LAB — RETIC PANEL
Immature Retic Fract: 5.3 % (ref 2.3–15.9)
RBC.: 2.73 MIL/uL — ABNORMAL LOW (ref 4.22–5.81)
Retic Count, Absolute: 19.1 10*3/uL (ref 19.0–186.0)
Retic Ct Pct: 0.7 % (ref 0.4–3.1)
Reticulocyte Hemoglobin: 35.9 pg

## 2024-03-26 LAB — LACTATE DEHYDROGENASE: LDH: 162 U/L (ref 105–235)

## 2024-03-26 LAB — FERRITIN: Ferritin: 545 ng/mL — ABNORMAL HIGH (ref 24–336)

## 2024-03-26 LAB — ABO/RH: ABO/RH(D): A POS

## 2024-03-26 LAB — VITAMIN B12: Vitamin B-12: 1404 pg/mL — ABNORMAL HIGH (ref 180–914)

## 2024-03-26 LAB — SAVE SMEAR(SSMR), FOR PROVIDER SLIDE REVIEW

## 2024-03-26 MED ORDER — LIDOCAINE HCL URETHRAL/MUCOSAL 2 % EX GEL
1.0000 | Freq: Once | CUTANEOUS | Status: AC
  Administered 2024-03-26: 1 via URETHRAL
  Filled 2024-03-26: qty 11

## 2024-03-26 MED ORDER — MORPHINE SULFATE (PF) 2 MG/ML IV SOLN: 2.0000 mg | Freq: Once | INTRAVENOUS | Status: DC

## 2024-03-26 MED ORDER — MORPHINE SULFATE (PF) 4 MG/ML IV SOLN
4.0000 mg | Freq: Once | INTRAVENOUS | Status: AC
  Administered 2024-03-26: 4 mg via INTRAVENOUS
  Filled 2024-03-26: qty 1

## 2024-03-26 MED ORDER — ACETAMINOPHEN 325 MG PO TABS: 650.0000 mg | ORAL_TABLET | Freq: Four times a day (QID) | ORAL | 0 refills | Status: DC | PRN

## 2024-03-26 NOTE — Progress Notes (Signed)
 " [facility] OFFICE PROGRESS NOTE   Diagnosis: Glioblastoma multiforme, anemia  INTERVAL HISTORY:   [name] contacted the office this morning with concern regarding recent abnormal labs.  He had preoperative blood work completed 03/24/2024 in anticipation of a TURP procedure 03/31/2024.  CBC returned with a hemoglobin of 9.1, MCV 109, white count 3.3, platelet count 170,000; basic metabolic panel abnormal with creatinine 2.4, BUN 28.  [name] reports a recent poor energy level.  No bleeding that he is aware of.  No bloody or black stools.  No hematuria.  No change in bowel habits.  No upper abdominal pain.  He has dyspnea on exertion.  No fevers or sweats.  Appetite is not great.  No weight loss.  No numbness or tingling in the hands or feet.  He continues to note a slow urine stream and incomplete emptying.  He reports beginning both meloxicam  15 mg daily and tamsulosin 0.4 mg twice daily 2 to 3 months ago.  He confirms a history of B12 deficiency and has been taking oral B12 consistently.  Comparison labs 11/11/2023 hemoglobin 11.4, MCV 106, white count 3.4, ANC 1.9, platelet count 214,000, BUN 15, creatinine 1.1.  Objective:  Vital signs in last 24 hours:  Blood pressure (!) 162/79, pulse 66, temperature 97.9 F (36.6 C), temperature source Temporal, resp. rate 18, height 5' 7 (1.702 m), weight 196 lb 1.6 oz (89 kg), SpO2 99%.    HEENT: No thrush or ulcers. Lymphatics: No palpable cervical, supraclavicular, axillary or inguinal lymph nodes. Resp: Lungs clear bilaterally. Cardio: Regular rate and rhythm. GI: No hepatosplenomegaly.  Nontender.  Fullness over the lower abdomen/pelvis. Vascular: No leg edema.  Skin: Palms are pale.   Lab Results:  Lab Results  Component Value Date   WBC 3.6 (L) 03/26/2024   HGB 9.5 (L) 03/26/2024   HCT 27.5 (L) 03/26/2024   MCV 103.0 (H) 03/26/2024   PLT 156 03/26/2024   NEUTROABS 2.1 03/26/2024  Peripheral blood  smear-few ovalocytes, teardrops, acanthocytes, rare cigar cell, polychromasia not increased, no nucleated red cells; white cell morphology is unremarkable, mature appearing lymphocytes, monocytes, neutrophils; platelets appear normal in number.  Imaging:  No results found.  Medications: I have reviewed the patient's current medications.  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 An MRI of the brain during a hospital admission on 03/27/2010 was compared to an MRI from [facility] on 02/22/2010. There was an increase in the abnormal signal of the left posterior opercular region and periatrial region, concerning for progression of tumor. Review of the MRI by Dr. Zulema at [facility] confirmed disease progression. Avastin was resumed on 05/28/2010. Hydroxyurea was initiated on 06/07/2010. The hydroxyurea was placed on hold on 06/27/2010 due to leukopenia and thrombocytopenia.   Initiation of salvage therapy with temozolomide  and the Celldex vaccine beginning on 08/16/2010.   MRI of the brain at [facility] on 10/31/2010 is concerning for disease progression with an interval increase in enhancement surrounding the resection cavity and inferior margin of the putamen and left optic tract. MRI of the brain at [facility] on 11/28/2010 showed a slight interval increase in peripheral nodular enhancement surrounding the resection cavity He underwent a repeat MRI of the brain at [facility] on 12/26/2010-this showed a stable appearance of the left temporal lobe resection cavity and marginal enhancement. Restaging MRI of the brain 12/26/2010 at [facility] with a slight interval decrease in the size of residual left temporal lobe tumor Restaging MRI of the brain at [facility] on 02/20/2011 revealed  a slight interval decrease in the residual left temporal lobe tumor Restaging MRI of the brain at [facility] on 04/03/2011 revealed stable disease. MRI 06/12/2011 at [facility] with stable left temporal resection cavity with decreased peripheral  enhancement He completed a final cycle of temozolomide  beginning on 06/27/11 and continues monthly vaccine therapy PET scan on may 29th 2013 at [facility] definite foci of hypermetabolic FDG activity identified, in the region of the irregular nodular MRI enhancement in the inferior left temporal lobe there are no definite foci of hypermetabolic activity, there was low level FDG activity in this location similar to the surrounding nonenhancing left temporal lobe Restaging MRI of the brain at [facility] on 11/06/2011-stable left brain resection site and? New less than 1 cm left brain lesion, no enhancement on a PET scan Stable radiographic disease on an MRI 12/31/2011 Continued treatment with the Celldex vaccine with an MRI of the brain at [facility] on 09/09/2012 revealed overall stable disease aside from a slight increase in a nodular focus of enhancement the left anterior temporal lobe with central necrosis MRI 12/02/2012 with increased enhancement in the anterior left temporal area MRI guided left temporal resection 01/07/2013-negative for tumor Continuation of treatment on the Celldex vaccine, treatment #32 on 01/27/2013 Restaging MRI at [facility] 03/08/2014 with no evidence of disease progression Dose #46 of the Celldex vaccine on 03/08/2014 MRI 01/13/2018- stable disease Dose #95  Celldex  vaccine 01/13/2018 MRI at [facility] 01/02/2015 without evidence of disease progression, dose #56 Celldex vaccine MRI at [facility] 06/19/2015-stable,Celldex # 64 given 08/15/2015 MRI at [facility] 05/21/2016-increase in size of focal nodular enhancement at the left temporal resection cavity MRI at [facility] 08/25/2018-stable MRI brain at [facility] 05/11/2019-stable 08/31/2019-end of Celldex vaccine, 116 total treatments given MRI brain at [facility] 02/18/2020-no evidence of progressive disease MRI brain at [facility] 08/27/2021-stable enhancement of the rim of the left brain resection cavity and stable areas of parenchymal enhancement in the left temporal lobe MRI  brain at [facility] 07/09/2022-no evidence of disease progression MRI brain at [facility] 07/29/2023-no evidence of disease progression MRI brain at [facility] 02/24/2024-no evidence of disease progression 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 Right visual field deficit secondary to progression of the glioblastoma multiforme, stable History of generalized seizures, maintained on Keppra . Increase in the Keppra  dose 09/09/2012 secondary to the possibility of visual seizures, Keppra  dose increased 2017 secondary to episodes of aphasia-improved Hypothyroidism, maintained on Synthroid . History of Hypertension, essential hypertension and an effect of Avastin. Improved History of nephrotic syndrome secondary to Avastin. History of progressive thrombocytopenia, status post a bone marrow biopsy on 04/26/2010 with findings of a hypocellular marrow with decreased megakaryocytes and a left shift in the myeloid and erythroid series and minimal dyspoiesis. Cytogenetics returned normal. He again developed thrombocytopenia after receiving Avastin and beginning hydroxyurea in April 2012. The platelet count has improved since the Avastin and hydroxyurea were discontinued.   Vitamin B12 deficiency. Acute diverticulitis in April 2012, improved with ciprofloxacin and Flagyl. Related to Avastin (?).   Adhesive capsulitis of the right shoulder, followed by Dr. Camella. He is no longer taking pain medication and the range of motion has improved after a physical therapy program.   Situational depression-maintained on Zoloft ,       13. Low serum testosterone  level on labs requested by Dr. Zulema in November 2012-he is not taking testosterone          14. Subdural hemorrhage after a fall requiring surgical evacuation in May 2015  15. Fall outside of his car 03/30/2014 with scalp abrasion/ecchymoses    Disposition: [name] remains in clinical  remission from the glioblastoma.  He continues follow-up with Dr. Zulema at [facility].  He presents today for evaluation of anemia and renal failure found on preop labs completed 03/24/2024 in anticipation of a TURP procedure next week.  Review of labs from today today shows a macrocytic anemia, mild leukopenia, creatinine 2.8.  Etiology unclear.  Blood smear has some changes suggestive of iron deficiency.  The renal failure could be due to obstructive uropathy.  Meloxicam  possibly contributing.  Additional labs have been ordered.  He will complete stool cards.  Plan for renal ultrasound today.  Follow-up will be arranged pending outstanding studies.  Patient seen with Dr. Cloretta.  [name] ANP/GNP-BC   03/26/2024  12:11 PM  This was a shared visit with [name].  [name] was interviewed and examined.  I reviewed the peripheral blood smear.  He is scheduled for a TURP next week for management of BPH symptoms.  He contacted us  after preoperative labs today revealed new onset anemia and an elevated creatinine.  The elevated creatinine could be related to bladder outlet obstruction and resulting hydronephrosis.  He has also been taking meloxicam  for the past several months.  We referred him for an urgent renal ultrasound.  The MCV has been elevated in the past.  He had mild anemia at [facility] on 11/11/2023.  The anemia could be related to renal insufficiency, iron deficiency, myelodysplasia, or another etiology.  We obtained additional diagnostic evaluation today including a chemistry panel, reticulocyte count, B12, and ferritin level.  He will be scheduled for a follow-up visit here pending the renal ultrasound and anemia evaluation.  Bruce Cloretta, MD           "

## 2024-03-26 NOTE — Progress Notes (Signed)
 Contacted [facility] in regards to STAT US  Renal, patient is scheduled 03/26/24 at 3 pm, made aware per radiology drink 32 oz of water 1 hour prior to scan, bladder has to be full, also made aware when patient returns to [facility] today will go over at home Occult blood card. Understood, no further questions.

## 2024-03-26 NOTE — ED Triage Notes (Signed)
 Pt sent down from oncologist w/ decreased urine output. Pt scheduled for a TURP next week. Pt reports increased pain in groin.

## 2024-03-26 NOTE — Telephone Encounter (Signed)
 I contacted [name] and his wife regarding the renal ultrasound.  There is evidence of bilateral hydronephrosis.  Dr. Cloretta thinks he may need a Foley catheter placed.  [facility] emergency department is aware he is coming to have this evaluated.

## 2024-03-26 NOTE — ED Provider Notes (Cosign Needed)
 " [place] EMERGENCY DEPARTMENT AT [facility] Provider Note   CSN: [id number] Arrival date & time: 03/26/24  1728     Patient presents with: Male GU Problem   [name] is a 64 y.o. male.   Male GU Problem Associated symptoms: abdominal pain   Patient is a 64 year old male presenting ED today for concerns for AKI on outpatient labs for upcoming TURP with [facility], Dr. Morene Salines.  Noted to have dribbling when urinating for several weeks and frequency.  Had outpatient labs and renal CT done which showed Moderate bilateral hydronephrosis, trabeculation and irregular wall thickening of the urinary bladder.  Sent here for Foley catheter.  Notes some mild mild suprapubic abdominal discomfort but otherwise no other complaints at this time.  Previous medical history of HTN, seizure disorder, glioblastoma multiforme, hypothyroidism.  Denies fever, chest pain, shortness breath, nausea, vomiting, dysuria, hematuria, diarrhea, melena, hematochezia, rashes, lower leg swelling.     Prior to Admission medications  Medication Sig Start Date End Date Taking? Authorizing Provider  aspirin EC 81 MG tablet Take 81 mg by mouth daily.    [provider]  Cyanocobalamin  (VITAMIN B12 PO) Take 500 mcg by mouth daily.    [provider]  levETIRAcetam  (KEPPRA ) 500 MG tablet TAKE 1 TABLET BY MOUTH TWICE DAILY 03/31/17   Jame Maude JULIANNA, MD  levothyroxine  (SYNTHROID , LEVOTHROID) 75 MCG tablet Take 1 tablet (75 mcg total) by mouth daily. 10/24/17   Jame Maude JULIANNA, MD  meloxicam  (MOBIC ) 15 MG tablet Take 15 mg by mouth daily. Patient not taking: Reported on 03/26/2024    [provider]  sertraline  (ZOLOFT ) 25 MG tablet Take 25 mg by mouth daily.    [provider]  tamsulosin (FLOMAX) 0.4 MG CAPS capsule Take 0.4 mg by mouth in the morning and at bedtime. Patient taking differently: Take 0.4 mg by mouth in the morning and at bedtime.  Patient reports taking once a day 06/01/23   [provider]    Allergies: Pollen extract and Penicillins    Review of Systems  Gastrointestinal:  Positive for abdominal pain.  Genitourinary:  Positive for difficulty urinating.  All other systems reviewed and are negative.   Updated Vital Signs BP (!) 177/95   Pulse 75   Temp 97.8 F (36.6 C)   Resp 20   Ht 5' 7 (1.702 m)   Wt 86.6 kg   SpO2 97%   BMI 29.91 kg/m   Physical Exam Vitals and nursing note reviewed.  Constitutional:      General: He is not in acute distress.    Appearance: Normal appearance. He is not ill-appearing or diaphoretic.  HENT:     Head: Normocephalic and atraumatic.  Eyes:     General: No scleral icterus.       Right eye: No discharge.        Left eye: No discharge.     Extraocular Movements: Extraocular movements intact.     Conjunctiva/sclera: Conjunctivae normal.  Cardiovascular:     Rate and Rhythm: Normal rate and regular rhythm.     Pulses: Normal pulses.     Heart sounds: Normal heart sounds. No murmur heard.    No friction rub. No gallop.  Pulmonary:     Effort: Pulmonary effort is normal. No respiratory distress.     Breath sounds: No stridor. No wheezing, rhonchi or rales.  Chest:     Chest wall: No tenderness.  Abdominal:  General: Abdomen is flat. There is no distension.     Palpations: Abdomen is soft.     Tenderness: There is abdominal tenderness (mild Suprapubic tenderness). There is no right CVA tenderness, left CVA tenderness, guarding or rebound.  Musculoskeletal:        General: No swelling, deformity or signs of injury.     Cervical back: Normal range of motion. No rigidity.     Right lower leg: No edema.     Left lower leg: No edema.  Skin:    General: Skin is warm and dry.     Findings: No bruising, erythema or lesion.  Neurological:     General: No focal deficit present.     Mental Status: He is alert and oriented to person, place, and time. Mental  status is at baseline.     Sensory: No sensory deficit.     Motor: No weakness.  Psychiatric:        Mood and Affect: Mood normal.     (all labs ordered are listed, but only abnormal results are displayed) Labs Reviewed - No data to display   EKG: None  Radiology: US  RENAL Result Date: 03/26/2024 EXAM: RETROPERITONEAL ULTRASOUND OF THE KIDNEYS 03/26/2024 03:03:11 PM TECHNIQUE: Real-time ultrasonography of the retroperitoneum, specifically the kidneys and urinary bladder, was performed. COMPARISON: None available. CLINICAL HISTORY: Anemia, renal failure, history of GBM. FINDINGS: RIGHT KIDNEY: Right kidney measures 10.8 x 6.0 x 6.3 cm. Normal cortical echogenicity. Moderate hydronephrosis. No calculus. No mass. LEFT KIDNEY: Left kidney measures 10.9 x 6.0 x 6.0 cm. Normal cortical echogenicity. Moderate hydronephrosis. No calculus. No mass. Urinary Bladder: Trabeculation and irregular wall thickening of the urinary bladder. Left ureteral jet is not visualized. IMPRESSION: 1. Moderate bilateral hydronephrosis. 2. Trabeculation and irregular wall thickening of the urinary bladder, which can be seen in both neurogenic bladder and chronic bilateral obstruction from prostatomegaly. Correlation with urinalysis recommended to exclude acute cystitis. Electronically signed by: Rogelia Myers MD 03/26/2024 04:31 PM EST RP Workstation: [id number]   Procedures   Medications Ordered in the ED  lidocaine  (XYLOCAINE ) 2 % jelly 1 Application (has no administration in time range)  morphine  (PF) 4 MG/ML injection 4 mg (4 mg Intravenous Given 03/26/24 2134)    Medical Decision Making Amount and/or Complexity of Data Reviewed Labs: ordered.  Risk Decision regarding hospitalization.  This patient is a 64 year old male who presents to the ED for concern of needing Foley catheter after renal ultrasound showing postobstructive uropathy with hydronephrosis, labs pre-TURP noted to show AKI with a creatinine of  2.8 with his baseline noted to be less than 1.  Total, Emergency Department for evaluation.  Noted to have had weeks long complaints of dribbling and frequent urination with inability to completely empty bladder.  On physical exam, patient is in no acute distress, afebrile, alert and orient x 4, speaking in full sentences, nontachypneic, nontachycardic.  LCTAB, RRR, no murmur, no CVA tenderness.  Though does have some mild suprapubic abdominal tenderness to palpation.  With exam otherwise unremarkable.  Lab work already obtained prior to arrival showing hemoglobin 9.5, increased from 2 days ago which was 9.1.  BMP noted to have creatinine of 2.81 increased from 2.4, noting that his baseline is less than 1.  With his AKI, would recommend at least admission for observation to ensure patient returns to baseline kidney function.  Foley catheter was placed with spontaneous drainage.  Patient care was then transferred over to Dr. Marcene with hospitalist.  Differential diagnoses  prior to evaluation: The emergent differential diagnosis includes, but is not limited to, AKI, obstructive uropathy, nephrolithiasis, hydronephrosis. This is not an exhaustive differential.   Past Medical History / Co-morbidities / Social History: HTN, seizure disorder, glioblastoma multiforme, hypothyroidism  Additional history: Chart reviewed. Pertinent results include:   Scheduled to undergo TURP on 03/31/2024.  Last seen by hematology oncology for glioblastoma multiforme, sent over for any emergent renal ultrasound for concerns for acute renal failure.  Lab Tests/Imaging studies: I personally interpreted labs/imaging and the pertinent results include:    CBC noted improved hemoglobin of 9.5 from yesterday 9.1 CMP notes increased 2.1 increased yesterday 2.4 with baseline of less than 1. Renal study today shows moderate hydronephrosis and trabeculation and irregular wall thickening of the urinary bladder   I agree  with the radiologist interpretation.    Medications: I ordered medication including morphine .  I have reviewed the patients home medicines and have made adjustments as needed.  Critical Interventions: None  Social Determinants of Health: Is followed by [facility]  Disposition: After consideration of the diagnostic results and the patients response to treatment, I feel that the patient would benefit from admission, patient care transferred over to Dr. Marcene.    Final diagnoses:  AKI (acute kidney injury)  Obstructive uropathy    ED Discharge Orders          Ordered    acetaminophen  (TYLENOL ) 325 MG tablet  Every 6 hours PRN,   Status:  Discontinued        03/26/24 1940               Beola Terrall RAMAN, [place] 03/26/24 2146  "

## 2024-03-26 NOTE — Telephone Encounter (Addendum)
 Call from [name] and [name] that he has seen Dr. Cam w/[facility] for urinary frequency and weak stream. Was being scheduled for TURP and had preop labs yesterday and says they were terrible. Nurse with Dr. Cam told them he should go to the emergency room and needs stat CT scan to see what is going on with Bruce creatinine (Dr. Cam not in office today). They are asking to be seen by Dr. Cloretta if possible. Dr. Cloretta made aware and reviewed labs. Called [name] to come in at 1030 for lab and OV with NP at 1045

## 2024-03-26 NOTE — ED Notes (Signed)
 Pt unable to tolerate foley catheter. RN aware

## 2024-03-26 NOTE — Progress Notes (Incomplete)
 Hospitalist Transfer Note:    Nursing staff, Please call [facility] Admits & Consults System-Wide number on Amion [phone]) as soon as patient's arrival, so appropriate admitting provider can evaluate the pt.  Transferring facility: [facility] Requesting provider: Arthor Captain, PA (EDP at [facility]) Reason for transfer: admission for further evaluation and management of acute diverticulitis in the setting of failed outpatient antibiotics.     64 year old male  who presented to [facility] ED complaining of 3 weeks of progressive lower abdominal discomfort.  Approximately 1 week ago he had presented to his PCP with complaints of lower abdominal discomfort, at which time PCP was able to arrange for outpatient CT abdomen/pelvis which was suggestive of acute diverticulitis.  The patient subsequently completed a course of Cipro/Flagyl, but is noted further ensuing progression of his lower abdominal discomfort, prompting him to present to [facility] this evening.  Vital signs in the ED were notable for the following: Afebrile, heart rates in the 60s to 80s; systolic blood pressures in the 110s to 130s mmHg.   Imaging in the ED today notable for CT abdomen/pelvis with contrast, which showed acute diverticulitis without evidence of obstruction, perforation, or abscess.  Medications administered prior to transfer included the following: Zosyn   Subsequently, I accepted this patient for transfer for inpatient admission to a med/tele bed at [facility] or [facility]  (first available) for further work-up and management of the above.        Bruce Pigg, DO Hospitalist

## 2024-03-31 ENCOUNTER — Encounter (HOSPITAL_COMMUNITY): Admission: RE | Payer: Self-pay | Source: Home / Self Care

## 2024-03-31 ENCOUNTER — Ambulatory Visit (HOSPITAL_COMMUNITY): Admission: RE | Admit: 2024-03-31 | Source: Home / Self Care | Admitting: Urology

## 2024-03-31 ENCOUNTER — Encounter (HOSPITAL_COMMUNITY): Payer: Self-pay | Admitting: Medical

## 2024-08-25 ENCOUNTER — Ambulatory Visit: Admitting: Oncology
